# Patient Record
Sex: Male | Born: 1960 | Race: White | Hispanic: No | Marital: Married
Health system: Midwestern US, Academic
[De-identification: ages and names within clinical notes are randomized; demographics above are authoritative.]

## PROBLEM LIST (undated history)

## (undated) DIAGNOSIS — R911 Solitary pulmonary nodule: Secondary | ICD-10-CM

## (undated) DIAGNOSIS — F419 Anxiety disorder, unspecified: Secondary | ICD-10-CM

## (undated) DIAGNOSIS — I10 Essential (primary) hypertension: Secondary | ICD-10-CM

## (undated) DIAGNOSIS — K219 Gastro-esophageal reflux disease without esophagitis: Secondary | ICD-10-CM

## (undated) HISTORY — PX: TONSILLECTOMY: SUR1361

## (undated) HISTORY — DX: Solitary pulmonary nodule: R91.1

## (undated) HISTORY — DX: Anxiety disorder, unspecified: F41.9

## (undated) HISTORY — PX: FOOT SURGERY: SHX648

## (undated) HISTORY — PX: COLONOSCOPY: SHX174

---

## 2007-12-18 HISTORY — PX: HERNIA REPAIR: SHX51

## 2009-10-19 ENCOUNTER — Inpatient Hospital Stay: Attending: Gastroenterology

## 2009-10-19 NOTE — Unmapped (Signed)
[facility]     PATIENT NAME:   Phillip Collins, Phillip Collins                MR #:  [id number]   DATE OF BIRTH:  1961/02/24                        ACCOUNT #:  [id number]   PHYSICIAN:      Kathyrn Sheriff, M.D.              ROOM #:  SDS   SERVICE:        Gastroenterology                  NURSING UNIT:  MSDS   PRIMARY:        Eveline Keto, M.D.                FCSalena Saner   REFERRINGKathyrn Sheriff, M.D.              ADMIT DATE:  10/19/2009   DICTATED BY:    Kathyrn Sheriff, M.D.              PROCEDURE DATE:  10/19/2009                                                     DISCHARGE DATE:                                    ENDOSCOPY REPORT     *-*-*     PROCEDURE PERFORMED:     1.  Colonoscopy.     PREOPERATIVE DIAGNOSIS:     1. The patient was referred initially for a screening colonoscopy, however,     in the interim since that referral he has developed some mild left lower     quadrant pain.  He describes it in the left mid to left lower quadrant with     some radiation towards the left flank.  The pain is very intermittent and     does not seem to worsen with having bowel movements or straining.  It does     worsen a little bit when moving.  He did have bilateral inguinal hernia     repairs earlier this year.  On examination, there is no tenderness and     there is no evidence of recurrent herniation.     POSTOPERATIVE DIAGNOSIS:     1.  Normal colonoscopy to the terminal ileum.     ANESTHESIA:  Versed 5 mg, fentanyl 100 mcg.     DETAILS OF PROCEDURE:  After adequate sedation, rectal exam was normal.   Scope was inserted, advanced to the terminal ileum.  The scope was withdrawn   and mucosa was inspected.  The terminal ileum, cecum, ascending colon,   transverse colon, descending colon, sigmoid colon and rectum were normal.   Retroflexion view in the rectum was normal.  The scope was completely   withdrawn from the patient.  The patient tolerated the procedure well with no   immediate  complications.     RECOMMENDATIONS:  The source of the patient's abdominal pain is not clear   with this procedure.  Clearly  from a screening standpoint, he has no evidence   of polyps or cancer visualized today.  I would recommend a repeat routine   colonoscopy in 10 years.  To further evaluate his left-sided abdominal pain,   I'll go ahead and order a CAT scan of the abdomen with and without contrast.   I'll see him back in the office in follow-up.     Thank you for allowing [place] to see this patient.  If you have any questions,   please do not hesitate to contact the office.       *-*-*                                             _______________________________________   MC/sar                                 _____   D:  10/19/2009 11:13                   Kathyrn Sheriff, M.D.   T:  10/19/2009 23:10   Job #:  [id number]     c:   Eveline Keto, M.D.                                     ENDOSCOPY REPORT                                                                PAGE    1 of   1                                                                PAGE    1 of   1

## 2012-01-19 ENCOUNTER — Emergency Department (HOSPITAL_COMMUNITY)
Admission: EM | Admit: 2012-01-19 | Discharge: 2012-01-19 | Disposition: A | Discharge status: Home / Self Care | Payer: Private Health Insurance - Indemnity | Attending: Emergency Medicine | Admitting: Emergency Medicine

## 2012-01-19 ENCOUNTER — Other Ambulatory Visit: Payer: Self-pay

## 2012-01-19 ENCOUNTER — Emergency Department (HOSPITAL_COMMUNITY): Payer: Private Health Insurance - Indemnity

## 2012-01-19 ENCOUNTER — Encounter (HOSPITAL_COMMUNITY): Payer: Self-pay | Admitting: Emergency Medicine

## 2012-01-19 DIAGNOSIS — R059 Cough, unspecified: Secondary | ICD-10-CM | POA: Insufficient documentation

## 2012-01-19 DIAGNOSIS — R05 Cough: Secondary | ICD-10-CM | POA: Insufficient documentation

## 2012-01-19 DIAGNOSIS — Z79899 Other long term (current) drug therapy: Secondary | ICD-10-CM | POA: Insufficient documentation

## 2012-01-19 DIAGNOSIS — Z7982 Long term (current) use of aspirin: Secondary | ICD-10-CM | POA: Insufficient documentation

## 2012-01-19 DIAGNOSIS — R079 Chest pain, unspecified: Secondary | ICD-10-CM | POA: Insufficient documentation

## 2012-01-19 LAB — COMPREHENSIVE METABOLIC PANEL
AST: 18 U/L (ref 0–37)
Albumin: 4 g/dL (ref 3.5–5.2)
Alkaline Phosphatase: 63 U/L (ref 39–117)
BUN: 21 mg/dL (ref 6–23)
CO2: 23 mEq/L (ref 19–32)
Chloride: 107 mEq/L (ref 96–112)
GFR calc non Af Amer: 82 mL/min — ABNORMAL LOW (ref >90)
Potassium: 4.1 mEq/L (ref 3.5–5.1)
Total Bilirubin: 0.3 mg/dL (ref 0.3–1.2)

## 2012-01-19 LAB — DIFFERENTIAL
Basophils Absolute: 0 10*3/uL (ref 0.0–0.1)
Basophils Relative: 0 % (ref 0–1)
Lymphocytes Relative: 24 % (ref 12–46)
Monocytes Relative: 5 % (ref 3–12)
Neutro Abs: 4.9 10*3/uL (ref 1.7–7.7)
Neutrophils Relative %: 70 % (ref 43–77)

## 2012-01-19 LAB — CBC
Hemoglobin: 15 g/dL (ref 13.0–17.0)
MCHC: 34.5 g/dL (ref 30.0–36.0)
WBC: 7 10*3/uL (ref 4.0–10.5)

## 2012-01-19 MED ORDER — NAPROXEN 500 MG PO TABS: 500.0000 mg | ORAL_TABLET | Freq: Two times a day (BID) | ORAL | Status: AC

## 2012-01-19 NOTE — ED Notes (Signed)
Pt states he was seen at [facility] for the chest pain around three weeks ago.  MD there felt it may be strained muscles.  Advised pt to use heat pad.  Pt reports continuing chest pain.

## 2012-01-19 NOTE — Discharge Instructions (Signed)
Chest Pain (Nonspecific) It is often hard to give a specific diagnosis for the cause of chest pain. There is always a chance that your pain could be related to something serious, such as a heart attack or a blood clot in the lungs. You need to follow up with your caregiver for further evaluation. CAUSES   Heartburn.   Pneumonia or bronchitis.   Anxiety and stress.   Inflammation around your heart (pericarditis) or lung (pleuritis or pleurisy).   A blood clot in the lung.   A collapsed lung (pneumothorax). It can develop suddenly on its own (spontaneous pneumothorax) or from injury (trauma) to the chest.  The chest wall is composed of bones, muscles, and cartilage. Any of these can be the source of the pain.  The bones can be bruised by injury.   The muscles or cartilage can be strained by coughing or overwork.   The cartilage can be affected by inflammation and become sore (costochondritis).  DIAGNOSIS  Lab tests or other studies, such as X-rays, an EKG, stress testing, or cardiac imaging, may be needed to find the cause of your pain.  TREATMENT   Treatment depends on what may be causing your chest pain. Treatment may include:   Acid blockers for heartburn.   Anti-inflammatory medicine.   Pain medicine for inflammatory conditions.   Antibiotics if an infection is present.   You may be advised to change lifestyle habits. This includes stopping smoking and avoiding caffeine and chocolate.   You may be advised to keep your head raised (elevated) when sleeping. This reduces the chance of acid going backward from your stomach into your esophagus.   Most of the time, nonspecific chest pain will improve within 2 to 3 days with rest and mild pain medicine.  HOME CARE INSTRUCTIONS   If antibiotics were prescribed, take the full amount even if you start to feel better.   For the next few days, avoid physical activities that bring on chest pain. Continue physical activities as  directed.   Do not smoke cigarettes or drink alcohol until your symptoms are gone.   Only take over-the-counter or prescription medicine for pain, discomfort, or fever as directed by your caregiver.   Follow your caregiver's suggestions for further testing if your chest pain does not go away.   Keep any follow-up appointments you made. If you do not go to an appointment, you could develop lasting (chronic) problems with pain. If there is any problem keeping an appointment, you must call to reschedule.  SEEK MEDICAL CARE IF:   You think you are having problems from the medicine you are taking. Read your medicine instructions carefully.   Your chest pain does not go away, even after treatment.   You develop a rash with blisters on your chest.  SEEK IMMEDIATE MEDICAL CARE IF:   You have increased chest pain or pain that spreads to your arm, neck, jaw, back, or belly (abdomen).   You develop shortness of breath, an increasing cough, or you are coughing up blood.   You have severe back or abdominal pain, feel sick to your stomach (nauseous) or throw up (vomit).   You develop severe weakness, fainting, or chills.   You have an oral temperature above 102 F (38.9 C), not controlled by medicine.  THIS IS AN EMERGENCY. Do not wait to see if the pain will go away. Get medical help at once. Call your local emergency services (911 in [place] Do not drive yourself to   the hospital. MAKE SURE YOU:   Understand these instructions.   Will watch your condition.   Will get help right away if you are not doing well or get worse.  Document Released: 09/12/2005 Document Revised: 08/15/2011 Document Reviewed: 07/08/2008 Exit[employer] Patient Information 2012 [employer]. 

## 2012-01-19 NOTE — ED Notes (Signed)
Pt. Stated, I've been having chest pain since November.  Last night it seemed worse especially in the arm pit and the neck.

## 2012-01-19 NOTE — ED Provider Notes (Signed)
History     CSN: [id number]  Arrival date & time 01/19/12  4098   First MD Initiated Contact with Patient 01/19/12 1015      Chief Complaint  Patient presents with  . Chest Pain    (Consider location/radiation/quality/duration/timing/severity/associated sxs/prior treatment) HPI Patient presents to the emergency room with complaints of chest pain that is ongoing since November. Patient states he has been having frequent almost daily episodes of discomfort in his left chest that goes to the back. Patient states it's a vague discomfort associated with some sharp shooting pain. He was seen by his doctor 3 weeks ago and at that time he's been having some cough and upper respiratory symptoms. I felt that it may be related to chest wall discomfort. Patient states since that time it has not gotten any better and has persisted. Patient does note that he exercises daily and never has any discomfort with exercising. He denies any shortness of breath, nausea, vomiting or abdominal pain. He has not noticed any leg swelling.  Patient states he has had these episodes in the past when he was living in [place] and had been seen in the emergency department a few times for it. They were never able to determine the exact cause. He states he also had a echocardiogram a few years ago after one of these episodes and that was normal as well. There is no history of heart problems or lung problems of the family.  He does take Zoloft for anxiety History reviewed. No pertinent past medical history.  History reviewed. No pertinent past surgical history.  No family history on file.  History  Substance Use Topics  . Smoking status: Not on file  . Smokeless tobacco: Not on file  . Alcohol Use: No      Review of Systems  All other systems reviewed and are negative.    Allergies  Review of patient's allergies indicates no known allergies.  Home Medications   Current Outpatient Rx  Name Route Sig Dispense Refill   . ASPIRIN EC 81 MG PO TBEC Oral Take 81 mg by mouth daily.    Marland Kitchen VITAMIN D 1000 UNITS PO CAPS Oral Take 1,000 Units by mouth daily.    . OMEGA-3 FATTY ACIDS 1000 MG PO CAPS Oral Take 1 g by mouth daily.    Marland Kitchen GLUCOSAMINE-CHONDROITIN 500-400 MG PO TABS Oral Take 1 tablet by mouth daily.    . ADULT MULTIVITAMIN W/MINERALS CH Oral Take 1 tablet by mouth daily.    . SERTRALINE HCL 50 MG PO TABS Oral Take 50 mg by mouth daily.      BP 154/85  Pulse 66  Temp(Src) 97.6 F (36.4 C) (Oral)  Resp 20  SpO2 99%  Physical Exam  Nursing note and vitals reviewed. Constitutional: He appears well-developed and well-nourished. No distress.  HENT:  Head: Normocephalic and atraumatic.  Right Ear: External ear normal.  Left Ear: External ear normal.  Eyes: Conjunctivae are normal. Right eye exhibits no discharge. Left eye exhibits no discharge. No scleral icterus.  Neck: Neck supple. No tracheal deviation present.  Cardiovascular: Normal rate, regular rhythm and intact distal pulses.   Pulmonary/Chest: Effort normal and breath sounds normal. No stridor. No respiratory distress. He has no wheezes. He has no rales.  Abdominal: Soft. Bowel sounds are normal. He exhibits no distension. There is no tenderness. There is no rebound and no guarding.  Musculoskeletal: He exhibits no edema and no tenderness.  Neurological: He is alert. He has  normal strength. No sensory deficit. Cranial nerve deficit:  no gross defecits noted. He exhibits normal muscle tone. He displays no seizure activity. Coordination normal.  Skin: Skin is warm and dry. No rash noted.  Psychiatric: He has a normal mood and affect.    ED Course  Procedures (including critical care time)  Rate: 61  Rhythm: normal sinus rhythm  QRS Axis: normal  Intervals: normal  ST/T Wave abnormalities: normal  Conduction Disutrbances:none  Narrative Interpretation:   Old EKG Reviewed: none available  Labs Reviewed  COMPREHENSIVE METABOLIC PANEL -  Abnormal; Notable for the following:    GFR calc non Af Amer 82 (*)    All other components within normal limits  CBC  DIFFERENTIAL  POCT I-STAT TROPONIN I   Dg Chest 2 View  01/19/2012  *RADIOLOGY REPORT*  Clinical Data: Left-sided chest pain.  Cough.  CHEST - 2 VIEW  Comparison:  None.  Findings:  The heart size and mediastinal contours are within normal limits.  Both lungs are clear.  The visualized skeletal structures are unremarkable.  IMPRESSION: No active cardiopulmonary disease.  Original Report Authenticated By: Danae Orleans, M.D.       MDM  The patient has had months of chest pain. Symptoms have been somewhat atypical in nature. He has a normal EKG and normal laboratory testing. Patient previously has had these symptoms and has had a negative stress test per his history. At this time I have low suspicion for an acute cardiac etiology. I feel it is reasonable for him to follow up with his primary doctor as an outpatient. I will try a course of nonsteroidal anti-inflammatory medications to see if they help.       Celene Kras, MD 01/19/12 [phone]

## 2013-06-15 ENCOUNTER — Encounter (HOSPITAL_COMMUNITY): Payer: Self-pay | Admitting: Emergency Medicine

## 2013-06-15 ENCOUNTER — Emergency Department (HOSPITAL_COMMUNITY): Payer: Private Health Insurance - Indemnity

## 2013-06-15 ENCOUNTER — Emergency Department (HOSPITAL_COMMUNITY)
Admission: EM | Admit: 2013-06-15 | Discharge: 2013-06-15 | Disposition: A | Discharge status: Home / Self Care | Payer: Private Health Insurance - Indemnity | Attending: Emergency Medicine | Admitting: Emergency Medicine

## 2013-06-15 DIAGNOSIS — R0602 Shortness of breath: Secondary | ICD-10-CM | POA: Insufficient documentation

## 2013-06-15 DIAGNOSIS — Z7982 Long term (current) use of aspirin: Secondary | ICD-10-CM | POA: Insufficient documentation

## 2013-06-15 DIAGNOSIS — R079 Chest pain, unspecified: Secondary | ICD-10-CM

## 2013-06-15 DIAGNOSIS — Z79899 Other long term (current) drug therapy: Secondary | ICD-10-CM | POA: Insufficient documentation

## 2013-06-15 DIAGNOSIS — K219 Gastro-esophageal reflux disease without esophagitis: Secondary | ICD-10-CM | POA: Insufficient documentation

## 2013-06-15 DIAGNOSIS — R072 Precordial pain: Secondary | ICD-10-CM | POA: Insufficient documentation

## 2013-06-15 LAB — CBC
HCT: 43 % (ref 39.0–52.0)
Hemoglobin: 14.5 g/dL (ref 13.0–17.0)
MCH: 30.6 pg (ref 26.0–34.0)
MCHC: 33.7 g/dL (ref 30.0–36.0)

## 2013-06-15 LAB — BASIC METABOLIC PANEL
BUN: 14 mg/dL (ref 6–23)
Calcium: 8.8 mg/dL (ref 8.4–10.5)
Creatinine, Ser: 1.23 mg/dL (ref 0.50–1.35)
GFR calc non Af Amer: 65 mL/min — ABNORMAL LOW (ref >90)
Glucose, Bld: 90 mg/dL (ref 70–99)

## 2013-06-15 MED ORDER — PANTOPRAZOLE SODIUM 20 MG PO TBEC: 20.0000 mg | DELAYED_RELEASE_TABLET | Freq: Every day | ORAL | Status: DC

## 2013-06-15 MED ORDER — PANTOPRAZOLE SODIUM 20 MG PO TBEC
20.0000 mg | DELAYED_RELEASE_TABLET | Freq: Once | ORAL | Status: AC
  Administered 2013-06-15: 20 mg via ORAL
  Filled 2013-06-15: qty 1

## 2013-06-15 MED ORDER — GI COCKTAIL ~~LOC~~
30.0000 mL | Freq: Once | ORAL | Status: AC
  Administered 2013-06-15: 30 mL via ORAL
  Filled 2013-06-15: qty 30

## 2013-06-15 NOTE — ED Notes (Signed)
Pt c/o epigastric pain x several weeks worse over last 3 days; pt sts some SOB

## 2013-06-15 NOTE — ED Provider Notes (Signed)
History    CSN: [id number] Arrival date & time 06/15/13  1029  None    Chief Complaint  Patient presents with  . Chest Pain   (Consider location/radiation/quality/duration/timing/severity/associated sxs/prior Treatment) The history is provided by the patient.   Pt presents to the ED for chest pain.  States he has battled with it for several weeks but steadily increasing in severity over the past 3 days.  Describes the pain as a mid-sternal tightness and burning sensation. He states he feels like there is a "blockage" in his throat preventing him from breathing fully.  No difficulty or pain with swallowing.  Pain no associated with palpitations, dizziness, weakness, numbness, or paresthesias of extremities.  Has seen cardiology in the past with a negative echocardiogram in 2009 according to pt.  Negative personal or family hx of CAD or MI.  Patient does not that he travels a great deal for work, recent flight back from [place] last week.  Denies any lower extremity swelling or calf pain.  Pt is very active, daily runner.  No significant medial problems other than anxiety for which he takes zoloft.  Saw his PCP for similar sx recently and told it was GERD.  Started taking daily OTC medications without improvement.  History reviewed. No pertinent past medical history. History reviewed. No pertinent past surgical history. History reviewed. No pertinent family history. History  Substance Use Topics  . Smoking status: Not on file  . Smokeless tobacco: Not on file  . Alcohol Use: No    Review of Systems  Respiratory: Positive for shortness of breath.   Cardiovascular: Positive for chest pain.  All other systems reviewed and are negative.    Allergies  Review of patient's allergies indicates no known allergies.  Home Medications   Current Outpatient Rx  Name  Route  Sig  Dispense  Refill  . aspirin EC 81 MG tablet   Oral   Take 81 mg by mouth daily.         .  Cholecalciferol (VITAMIN D) 1000 UNITS capsule   Oral   Take 1,000 Units by mouth daily.         . fish oil-omega-3 fatty acids 1000 MG capsule   Oral   Take 1 g by mouth daily.         . Flaxseed, Linseed, (FLAX SEED OIL PO)   Oral   Take 1 capsule by mouth daily.         Marland Kitchen glucosamine-chondroitin 500-400 MG tablet   Oral   Take 1 tablet by mouth daily.         . Multiple Vitamin (MULITIVITAMIN WITH MINERALS) TABS   Oral   Take 1 tablet by mouth daily.         . psyllium (REGULOID) 0.52 G capsule   Oral   Take 0.52 g by mouth daily.         . sertraline (ZOLOFT) 50 MG tablet   Oral   Take 50 mg by mouth daily.          BP 125/81  Pulse 59  Temp(Src) 98.2 F (36.8 C) (Oral)  Resp 18  SpO2 100%  Physical Exam  Nursing note and vitals reviewed. Constitutional: He is oriented to person, place, and time. He appears well-developed and well-nourished.  HENT:  Head: Normocephalic and atraumatic.  Mouth/Throat: Uvula is midline, oropharynx is clear and moist and mucous membranes are normal. No oropharyngeal exudate, posterior oropharyngeal edema, posterior oropharyngeal erythema or  tonsillar abscesses.  Eyes: Conjunctivae and EOM are normal.  Neck: Normal range of motion. Neck supple.  Cardiovascular: Normal rate, regular rhythm and normal heart sounds.   Pulmonary/Chest: Effort normal and breath sounds normal. No respiratory distress. He has no wheezes.  Chest pain not reproducible with palpation to chest wall  Abdominal: There is no tenderness. There is no CVA tenderness, no tenderness at McBurney's point and negative Murphy's sign.  Musculoskeletal: Normal range of motion. He exhibits no edema.  Neurological: He is alert and oriented to person, place, and time. He has normal strength. No cranial nerve deficit or sensory deficit.  CN grossly intact, moves all extremities appropriately, no acute neuro deficits appreciated   Skin: Skin is warm and dry.   Psychiatric: He has a normal mood and affect.    ED Course  Procedures (including critical care time)   Date: 06/15/2013  Rate: 62  Rhythm: normal sinus rhythm  QRS Axis: normal  Intervals: normal  ST/T Wave abnormalities: normal  Conduction Disutrbances:none  Narrative Interpretation: NSR, no STEMI  Old EKG Reviewed: unchanged   Labs Reviewed  BASIC METABOLIC PANEL - Abnormal; Notable for the following:    GFR calc non Af Amer 65 (*)    GFR calc Af Amer 75 (*)    All other components within normal limits  CBC  D-DIMER, QUANTITATIVE  POCT I-STAT TROPONIN I   Dg Chest 2 View  06/15/2013   *RADIOLOGY REPORT*  Clinical Data: 52 year old male with centralized chest pain.  CHEST - 2 VIEW  Comparison: 01/19/2012.  Findings: Stable and normal lung volumes. Normal cardiac size and mediastinal contours.  Visualized tracheal air column is within normal limits.  The lungs are clear.  No pneumothorax or effusion. No acute osseous abnormality identified.  IMPRESSION: Negative, no acute cardiopulmonary abnormality.   Original Report Authenticated By: Erskine Speed, M.D.   1. Chest pain   2. Acid reflux     MDM   EKG NSR, no acute ischemic changes.  Trop and d-dimer negative.  CXR clear.   Other labs largely WNL.  Sx improved with GI cocktail and protonix- suspicion that chest pain is GERD related.   Doubt ACS, PE, dissection, or other vascular compromise.  Rx protonix.  FU with [facility].  Discussed plan with pt, he agreed.  Return precautions advised.  Discussed pt with Dr. Bernette Mayers who agrees with plan.        Garlon Hatchet, PA-C 06/15/13 1922  Garlon Hatchet, PA-C 06/15/13 [phone]

## 2013-06-15 NOTE — ED Notes (Signed)
Pt discharged.Vital signs stable and GCS 15 

## 2013-06-16 NOTE — ED Provider Notes (Signed)
Medical screening examination/treatment/procedure(s) were performed by non-physician practitioner and as supervising physician I was immediately available for consultation/collaboration.   Charles B. Sheldon, MD 06/16/13 2109 

## 2014-03-04 ENCOUNTER — Ambulatory Visit (INDEPENDENT_AMBULATORY_CARE_PROVIDER_SITE_OTHER): Payer: BC Managed Care – PPO | Admitting: Internal Medicine

## 2014-03-04 ENCOUNTER — Encounter: Payer: Self-pay | Admitting: Internal Medicine

## 2014-03-04 VITALS — BP 124/84 | HR 50 | Temp 98.2°F | Ht 71.0 in | Wt 191.6 lb

## 2014-03-04 DIAGNOSIS — R079 Chest pain, unspecified: Secondary | ICD-10-CM

## 2014-03-04 NOTE — Progress Notes (Signed)
Subjective:    Patient ID: Jonathan Bass, male    DOB: [birth year]  MRN: [id number]  HPI  [id number] yowm never smoked with no previous h/o childhood illnesses and full aerobic capacity in HS and continued to stay athletically active as adult but around 2009 onset of chest comfort > card/pulm eval in [place] resolved completely on zoloft meds but recurred in 2013 and Jun 2014 and referred 03/04/2014 by Jonathan Bass for pulmonary eval p neg stress by Jonathan Bass and neg egd by Jonathan Bass   03/04/2014 1st [facility] office visit/ Jonathan Bass maintained on 100 mg per day zoloft Chief Complaint  Patient presents with  . Pulmonary Consult    Self referral- pt c/o discomfort in chest since June 2014.   no change with librax/protonix  Pattern is daily  Since onset June 2014 / diffuse  Bilateral Anterior upper chest and neck burning/ tingling constant x 6 m but less noticeable when at beach and better p exertion and not present with sleeping   No obvious other patterns in day to day or daytime variabilty or assoc chronic cough  Or sob or pleuritic cp or chest tightness, subjective wheeze overt sinus or hb symptoms. No unusual exp hx or h/o childhood pna/ asthma or knowledge of premature birth.  Sleeping ok without nocturnal  or early am exacerbation  of respiratory  c/o's or need for noct saba. Also denies any obvious fluctuation of symptoms with weather or environmental changes or other aggravating or alleviating factors except as outlined above   Current Medications, Allergies, Complete Past Medical History, Past Surgical History, Family History, and Social History were reviewed in [facility] electronic medical record.           Review of Systems  Constitutional: Negative for fever, chills, activity change, appetite change and unexpected weight change.  HENT: Negative for congestion, dental problem, postnasal drip, rhinorrhea, sneezing, sore throat, trouble swallowing and voice change.    Eyes: Negative for visual disturbance.  Respiratory: Negative for cough, choking and shortness of breath.        Chest discomfort   Cardiovascular: Negative for chest pain and leg swelling.  Gastrointestinal: Negative for nausea, vomiting and abdominal pain.  Genitourinary: Negative for difficulty urinating.  Musculoskeletal: Negative for arthralgias.  Skin: Negative for rash.  Psychiatric/Behavioral: Negative for behavioral problems and confusion.       Objective:   Physical Exam  Tense wm nad  Wt Readings from Last 3 Encounters:  03/04/14 191 lb 9.6 oz (86.909 kg)     HEENT: nl dentition, turbinates, and orophanx. Nl external ear canals without cough reflex   NECK :  without JVD/Nodes/TM/ nl carotid upstrokes bilaterally   LUNGS: no acc muscle use, clear to A and P bilaterally without cough on insp or exp maneuvers   CV:  RRR  no s3 or murmur or increase in P2, no edema   ABD:  soft and nontender with nl excursion in the supine position. No bruits or organomegaly, bowel sounds nl  MS:  warm without deformities, calf tenderness, cyanosis or clubbing  SKIN: warm and dry without lesions    NEURO:  alert, approp, no deficits    cxr 06/15/13 Findings: Stable and normal lung volumes. Normal cardiac size and  mediastinal contours. Visualized tracheal air column is within  normal limits. The lungs are clear. No pneumothorax or effusion.  No acute osseous abnormality identified.        Assessment & Plan:

## 2014-03-04 NOTE — Patient Instructions (Addendum)
GERD (REFLUX)  is an extremely common cause of respiratory symptoms, many times with no significant heartburn at all.    It can be treated with medication, but also with lifestyle changes including avoidance of late meals, excessive alcohol, smoking cessation, and avoid fatty foods, chocolate, peppermint, colas, red wine, and acidic juices such as orange juice.  NO MINT OR MENTHOL PRODUCTS SO NO COUGH DROPS  USE SUGARLESS CANDY INSTEAD (jolley ranchers or Stover's)  NO OIL BASED VITAMINS - use powdered substitutes.  Avoid  foods that cause gas (especially beans and raw vegetables like spinach and salads and boiled eggs)  and citrucel 1 heaping tsp twice daily with a large glass of water.  Pain should improve w/in 2 weeks and if not then consider further GI work up.      Pulmonary follow up is as needed

## 2014-03-05 DIAGNOSIS — R072 Precordial pain: Secondary | ICD-10-CM | POA: Insufficient documentation

## 2014-03-05 NOTE — Assessment & Plan Note (Signed)
Classic   pain pattern suggests ibs:  Stereotypical,  very limited distribution of pain locations, daytime, not exacerbated by ex or coughing, worse in sitting position, , not as noticeable present supine due to the dome effect of the diaphragm is  canceled in that position. Frequently these patients have had multiple negative GI workups and CT scans as is the case here   Treatment consists of avoiding foods that cause gas (especially beans and raw vegetables like spinach and salads)  and citrucel 1 heaping tsp twice daily with a large glass of water.  Pain should improve w/in 2 weeks and if not then consider further GI work up for non - acid GERD with esophageal spasm? which is still also in the ddx? (defer this issue to Dr Madilyn FiremanHayes  If not better then reasonable to repeat a CTa of chest but informed pt this would be very very low yield, high cost and RT

## 2014-10-17 DIAGNOSIS — R911 Solitary pulmonary nodule: Secondary | ICD-10-CM

## 2014-10-17 HISTORY — DX: Solitary pulmonary nodule: R91.1

## 2014-10-21 ENCOUNTER — Other Ambulatory Visit: Payer: Self-pay | Admitting: Orthopedic Surgery

## 2014-10-21 DIAGNOSIS — R079 Chest pain, unspecified: Secondary | ICD-10-CM

## 2014-10-21 DIAGNOSIS — M546 Pain in thoracic spine: Secondary | ICD-10-CM

## 2014-10-26 ENCOUNTER — Ambulatory Visit
Admission: RE | Admit: 2014-10-26 | Discharge: 2014-10-26 | Disposition: A | Discharge status: Home / Self Care | Payer: BC Managed Care – PPO | Source: Ambulatory Visit | Attending: Orthopedic Surgery | Admitting: Orthopedic Surgery

## 2014-10-26 DIAGNOSIS — M546 Pain in thoracic spine: Secondary | ICD-10-CM

## 2014-10-26 DIAGNOSIS — R079 Chest pain, unspecified: Secondary | ICD-10-CM

## 2015-04-11 ENCOUNTER — Telehealth: Payer: Self-pay | Admitting: Vascular Surgery

## 2015-04-11 NOTE — Telephone Encounter (Signed)
Per Marisue IvanLiz:   "needs a right leg reflux study and any md new vv after"  04/11/15: lm for pt re appt on 05/18- asked that he cb to schedule, dpm

## 2015-04-26 ENCOUNTER — Other Ambulatory Visit: Payer: Self-pay | Admitting: *Deleted

## 2015-04-26 DIAGNOSIS — I83891 Varicose veins of right lower extremities with other complications: Secondary | ICD-10-CM

## 2015-05-03 ENCOUNTER — Other Ambulatory Visit: Payer: Self-pay | Admitting: Family Medicine

## 2015-05-03 ENCOUNTER — Encounter: Payer: Self-pay | Admitting: Vascular Surgery

## 2015-05-03 DIAGNOSIS — R911 Solitary pulmonary nodule: Secondary | ICD-10-CM

## 2015-05-05 ENCOUNTER — Encounter: Payer: Self-pay | Admitting: Vascular Surgery

## 2015-05-05 ENCOUNTER — Encounter (HOSPITAL_COMMUNITY): Payer: Self-pay

## 2015-05-06 ENCOUNTER — Ambulatory Visit
Admission: RE | Admit: 2015-05-06 | Discharge: 2015-05-06 | Disposition: A | Discharge status: Home / Self Care | Payer: BLUE CROSS/BLUE SHIELD | Source: Ambulatory Visit | Attending: Family Medicine | Admitting: Family Medicine

## 2015-05-06 DIAGNOSIS — R911 Solitary pulmonary nodule: Secondary | ICD-10-CM

## 2015-07-04 ENCOUNTER — Other Ambulatory Visit (HOSPITAL_COMMUNITY): Payer: Self-pay | Admitting: Otolaryngology

## 2015-07-04 DIAGNOSIS — J320 Chronic maxillary sinusitis: Secondary | ICD-10-CM

## 2015-07-13 ENCOUNTER — Ambulatory Visit (HOSPITAL_COMMUNITY)
Admission: RE | Admit: 2015-07-13 | Discharge: 2015-07-13 | Disposition: A | Discharge status: Home / Self Care | Payer: BLUE CROSS/BLUE SHIELD | Source: Ambulatory Visit | Attending: Otolaryngology | Admitting: Otolaryngology

## 2015-07-13 DIAGNOSIS — J320 Chronic maxillary sinusitis: Secondary | ICD-10-CM

## 2015-07-13 DIAGNOSIS — R51 Headache: Secondary | ICD-10-CM | POA: Diagnosis not present

## 2015-07-13 MED ORDER — GADOBENATE DIMEGLUMINE 529 MG/ML IV SOLN
20.0000 mL | Freq: Once | INTRAVENOUS | Status: AC | PRN
  Administered 2015-07-13: 17 mL via INTRAVENOUS

## 2016-03-26 ENCOUNTER — Other Ambulatory Visit: Payer: Self-pay | Admitting: Family Medicine

## 2016-03-26 DIAGNOSIS — R911 Solitary pulmonary nodule: Secondary | ICD-10-CM

## 2016-04-20 ENCOUNTER — Ambulatory Visit
Admission: RE | Admit: 2016-04-20 | Discharge: 2016-04-20 | Disposition: A | Discharge status: Home / Self Care | Payer: Managed Care, Other (non HMO) | Source: Ambulatory Visit | Attending: Family Medicine | Admitting: Family Medicine

## 2016-04-20 DIAGNOSIS — R911 Solitary pulmonary nodule: Secondary | ICD-10-CM

## 2018-04-17 ENCOUNTER — Other Ambulatory Visit: Payer: Self-pay | Admitting: Family Medicine

## 2018-04-17 DIAGNOSIS — R911 Solitary pulmonary nodule: Secondary | ICD-10-CM

## 2018-04-21 ENCOUNTER — Ambulatory Visit
Admission: RE | Admit: 2018-04-21 | Discharge: 2018-04-21 | Disposition: A | Discharge status: Home / Self Care | Payer: Managed Care, Other (non HMO) | Source: Ambulatory Visit | Attending: Family Medicine | Admitting: Family Medicine

## 2018-04-21 DIAGNOSIS — R911 Solitary pulmonary nodule: Secondary | ICD-10-CM

## 2018-10-24 ENCOUNTER — Emergency Department: Admit: 2018-10-25 | Payer: PRIVATE HEALTH INSURANCE

## 2018-10-24 DIAGNOSIS — R079 Chest pain, unspecified: Secondary | ICD-10-CM

## 2018-10-24 NOTE — Unmapped (Signed)
Patient to ED with c/o Chest pain.  Patient reports pain started 30 minutes ago.  At that time, he was SOB and diaphoretic.

## 2018-10-24 NOTE — Unmapped (Signed)
[place] ED Note    Date of Service: 10/24/2018    Reason for Visit: No chief complaint on file.      Patient History     HPI:  This is a 57 y.o. male with history as documented below presenting with chest pain.    Pain started shortly before arrival, approximately 30-40 minutes ago.  He says he was sitting on his couch, when he stood up he had sudden onset of diffuse anterior chest pain that he described like a throbbing and squeezing/tight sensation.  It did not radiate.  It was not exertional.  He had associated shortness of breath and diaphoresis.  States he has never felt anything like this before.  He says it is not completely gone but is much better than it was when it started.  No longer having any shortness of breath or diaphoresis.  He denies any previous cardiac problems.  No family history of CAD.  He does not smoke.  He denies any history of hypertension, hyperlipidemia, or diabetes.  No history of deep vein thrombosis or PE.  No prior cancer.  Takes no hormones.  However patient does report that he drove home from [place] today.  Has not noted any pain, swelling, or asymmetry in his legs.  Denies any other recent illness.      History reviewed. No pertinent past medical history.    Past Surgical History:   Procedure Laterality Date   ??? HERNIA REPAIR     ??? TONSILLECTOMY         KAVONTE BEARSE  reports that he has never smoked. He has never used smokeless tobacco. He reports previous alcohol use. He reports that he does not use drugs.    Patient's Medications    No medications on file       Allergies:   Allergies as of 10/24/2018   ??? (No Known Allergies)       PMH: Nursing notes reviewed   PSH: Nursing notes reviewed   FH: Nursing notes reviewed   MEDS: Nursing notes and chart reviewed     Review of Systems     ROS:    CONST - no fevers, no chills   HEAD - no headache   EYES/EARS - no vision changes  NOSE/THROAT - no rhinorrhea, no sore throat    CV - no palpitations   PULM - no cough, no sputum production; no hemoptysis   GI - no abd pain, no n/v/d   GU - no dysuria, no hematuria  MS - no swelling  NEURO - no paresis, no numbness  INTEGUMENT - no lesions, no rashes     Physical Exam     Vitals:    10/24/18 2201 10/25/18 0107   BP: (!) 157/94 112/70   BP Location: Left arm Right arm   Patient Position: Sitting Lying   Pulse: 62 56   Resp: 16 16   Temp: 97.5 ??F (36.4 ??C)    TempSrc: Oral    SpO2: 99% 98%   Weight: 190 lb (86.2 kg)    Height: 5' 11 (1.803 m)        General:  well developed; well appearing male in no apparent distress   HEENT:  normocephalic, atraumatic; pupils equal, round and reactive; sclera anicteric; conjunctiva pink; moist mucous membranes; no oropharyngeal erythema or mucosal lesions   Neck:  Neck moves easily; range of motion full.  Pulmonary:  no respiratory distress; lung sounds clear to auscultation bilaterally  with good air entry;  no wheezes or rales   Cardiac:  regular rate and regular rhythm with no murmurs, rubs, or gallops   Abdomen:  soft; non-distended; non-tender, no rebound or guarding  Musculoskeletal:  no focal swelling or tenderness. No peripheral edema   Vascular:  2+ peripheral pulses   Skin:  warm and well perfused without rashes or lesions   Neuro:  Awake and alert.  Moves all extremities spontaneously.   Psych:  appropriate mood and affect     Diagnostic Studies     Labs:  Relevant labs reviewed.  Please see medical record for test performed in the ED.    Radiology:  X-ray Chest PA and Lateral   Final Result   IMPRESSION:    No acute cardiopulmonary abnormality.      Approved by Beola Cord, MD on 10/24/2018 10:44 PM EST      I have personally reviewed the images and I agree with this report.      Report Verified by: Lana Fish, MD at 10/24/2018 10:45 PM EST          EKG:  Normal sinus rhythm. Rate 62 bpm. Normal intervals. Normal axis.  Isolated TWI of lead 3.  No ST changes concerning for ischemia. No prior  available.    Emergency Department Procedures     Procedures    ED Course and MDM     VARDAAN DEPASCALE is a 57 y.o. male with a history and presentation as described above in HPI.  The patient was evaluated by myself.    Upon presentation, the patient was well-appearing, afebrile and hemodynamically stable.  Blood pressure is mildly elevated here although he notes no prior history of hypertension.    I do not feel that the patient???s chest pain is related to acute coronary syndrome given the lack of risk factors and based on the description of symptoms. Troponin was negative on arrival and 90 minutes.  There was a normal EKG and chest X-ray.  HEART score of 2 (possibly 3 if actually has undiagnosed HTN), making him low risk for MACE.  BNP is normal and he has no signs of volume overload and I'm not concerned for HF.  There are no signs of pneumonia based on exam or history and no signs of a pneumothorax or pleural effusion.  There were also no signs of pericarditis based on history or EKG.  No clinical concern for aortic dissection.  Given his prolonged travel time driving from out of state today did have concern for possibility of PE although he otherwise has a lack of risk factors and clinical presentation is not entirely consistent with this.  The patient is not tachycardic, hypoxic or tachypneic and his pain is not pleuritic in nature.  D-dimer is negative making it exceedingly unlikely in combination with overall low clinical suspicion that he has PE.    The patient overall appears well, is breathing comfortably, and is stable for discharge home with followup.      Summary of Treatment in ED:    Medications - No data to display    Impression     1. Chest pain, unspecified type         Plan   At this time the patient has been deemed safe for discharge. The patient was advised to follow up with his PCP.  Risks, benefits, and alternatives were discussed.  Discharge instructions including strict return precautions  for worsening or new symptoms have been communicated.  The patient verbalized understanding of the instructions and agreement with the plan.         Antonietta Jewel, MD   [facility]               Areatha Keas, MD  10/25/18 [phone]

## 2018-10-25 ENCOUNTER — Inpatient Hospital Stay
Admit: 2018-10-25 | Discharge: 2018-10-25 | Disposition: A | Discharge status: Home / Self Care | Payer: PRIVATE HEALTH INSURANCE

## 2018-10-25 LAB — BASIC METABOLIC PANEL
Anion Gap: 10 mmol/L (ref 3–16)
BUN: 20 mg/dL (ref 7–25)
CO2: 22 mmol/L (ref 21–33)
Calcium: 9.1 mg/dL (ref 8.6–10.3)
Chloride: 105 mmol/L (ref 98–110)
Creatinine: 1.18 mg/dL (ref 0.60–1.30)
Glucose: 103 mg/dL — ABNORMAL HIGH (ref 70–100)
Osmolality, Calculated: 287 mosm/kg (ref 278–305)
Potassium: 3.8 mmol/L (ref 3.5–5.3)
Sodium: 137 mmol/L (ref 133–146)
eGFR AA CKD-EPI: 78 See note.
eGFR NONAA CKD-EPI: 67 See note.

## 2018-10-25 LAB — DIFFERENTIAL
Basophils Absolute: 47 /uL (ref 0–200)
Basophils Relative: 0.7 % (ref 0.0–1.0)
Eosinophils Absolute: 281 /uL (ref 15–500)
Eosinophils Relative: 4.2 % (ref 0.0–8.0)
Lymphocytes Absolute: 2626 /uL (ref 850–3900)
Lymphocytes Relative: 39.2 % (ref 15.0–45.0)
Monocytes Absolute: 549 /uL (ref 200–950)
Monocytes Relative: 8.2 % (ref 0.0–12.0)
Neutrophils Absolute: 3196 /uL (ref 1500–7800)
Neutrophils Relative: 47.7 % (ref 40.0–80.0)

## 2018-10-25 LAB — PROTIME-INR
INR: 0.8 — ABNORMAL LOW (ref 0.9–1.1)
Protime: 11.7 s — ABNORMAL LOW (ref 12.1–15.1)

## 2018-10-25 LAB — CBC
Hematocrit: 45 % (ref 38.5–50.0)
Hemoglobin: 15.1 g/dL (ref 13.2–17.1)
MCH: 30.8 pg (ref 27.0–33.0)
MCHC: 33.5 g/dL (ref 32.0–36.0)
MCV: 91.9 fL (ref 80.0–100.0)
MPV: 8.9 fL (ref 7.5–11.5)
Platelets: 197 10*3/uL (ref 140–400)
RBC: 4.9 10*6/uL (ref 4.20–5.80)
RDW: 13.5 % (ref 11.0–15.0)
WBC: 6.7 10*3/uL (ref 3.8–10.8)

## 2018-10-25 LAB — TROPONIN I 0 HOUR: Troponin I: <0.04 ng/mL (ref 0.00–0.03)

## 2018-10-25 LAB — TROPONIN I 90 MIN: Troponin I: <0.04 ng/mL (ref 0.00–0.03)

## 2018-10-25 LAB — B NATRIURETIC PEPTIDE: BNP: 15 pg/mL (ref 0–100)

## 2018-10-25 LAB — APTT: aPTT: 31.3 seconds (ref 25.5–35.0)

## 2018-10-25 LAB — DDIMER: D-Dimer: <0.27 ug{FEU}/mL (ref 0.00–0.50)

## 2018-10-25 NOTE — Unmapped (Addendum)
Follow up with your physician or clinic within the next week. Return to the ED immediately if you have worse or continued chest pain, shortness of breath, nausea, sweating during chest pain, trouble breathing, chest pain with exertion (climbing stairs or walking for example), or any other concerns.

## 2018-10-25 NOTE — ED Notes (Signed)
Patient ready for discharge. Discharge instructions reviewed with patient. Patient alert/oriented X4. VSS.

## 2018-12-17 DIAGNOSIS — Z89511 Acquired absence of right leg below knee: Secondary | ICD-10-CM

## 2018-12-17 HISTORY — DX: Acquired absence of right leg below knee: Z89.511

## 2018-12-25 ENCOUNTER — Other Ambulatory Visit: Payer: Self-pay

## 2018-12-25 ENCOUNTER — Emergency Department (HOSPITAL_BASED_OUTPATIENT_CLINIC_OR_DEPARTMENT_OTHER)
Admission: EM | Admit: 2018-12-25 | Discharge: 2018-12-25 | Disposition: A | Discharge status: Home / Self Care | Payer: 59 | Attending: Emergency Medicine | Admitting: Emergency Medicine

## 2018-12-25 ENCOUNTER — Emergency Department (HOSPITAL_BASED_OUTPATIENT_CLINIC_OR_DEPARTMENT_OTHER): Payer: 59

## 2018-12-25 ENCOUNTER — Encounter (HOSPITAL_BASED_OUTPATIENT_CLINIC_OR_DEPARTMENT_OTHER): Payer: Self-pay | Admitting: *Deleted

## 2018-12-25 DIAGNOSIS — Z79899 Other long term (current) drug therapy: Secondary | ICD-10-CM | POA: Insufficient documentation

## 2018-12-25 DIAGNOSIS — Z7982 Long term (current) use of aspirin: Secondary | ICD-10-CM | POA: Diagnosis not present

## 2018-12-25 DIAGNOSIS — R079 Chest pain, unspecified: Secondary | ICD-10-CM | POA: Insufficient documentation

## 2018-12-25 LAB — TROPONIN I: Troponin I: <0.03 ng/mL (ref <0.03)

## 2018-12-25 MED ORDER — OMEPRAZOLE 20 MG PO CPDR: 20.0000 mg | DELAYED_RELEASE_CAPSULE | Freq: Every day | ORAL | 0 refills | Status: DC

## 2018-12-25 MED ORDER — IOPAMIDOL (ISOVUE-370) INJECTION 76%
100.0000 mL | Freq: Once | INTRAVENOUS | Status: AC | PRN
  Administered 2018-12-25: 100 mL via INTRAVENOUS

## 2018-12-25 NOTE — ED Triage Notes (Signed)
He had a work up today at his MD's office and he had an elevated DDimer. His MD advised him to come here for further testing and treatment.

## 2018-12-25 NOTE — ED Notes (Signed)
Pt reports on 10/25/18 he had pounding/throbbing chest pain with pressure after a drive to [place] that sent him to the ED up there. Pt reports everything checked out fine during that episode. Pt reports this has occurred 6 different times since then. Pt reports these episodes happen when he is laying down for the night. Pt reports he exercises every day with no problems. Pt reports last occurrence happened Tuesday night and he went to his PCP. Pt reports his PCP did a D-Dimer and he received a call that said it was elevated. Pt was told to come into the ED for further evaluation.

## 2018-12-25 NOTE — ED Notes (Signed)
Patient verbalizes understanding of discharge instructions. Opportunity for questioning and answers were provided. Armband removed by staff, pt discharged from ED home via POV.  

## 2018-12-25 NOTE — ED Provider Notes (Signed)
[facility] EMERGENCY DEPARTMENT Provider Note   CSN: [id number] Arrival date & time: 12/25/18  2129     History   Chief Complaint Chief Complaint  Patient presents with  . Chest Pain    HPI Jonathan Bass is a 58 y.o. male.  HPI Patient is sent in after outpatient positive d-dimer.  Over the last 2 months been having episodes of chest pain.  He states will come about every 10 days.  States it tends to be in the morning when he wakes up.  Had had a previous episode in [place] when he had been up there in November.  Had had a work-up in [place] it was negative including a d-dimer.  However at [facility] today reportedly had another d-dimer that was positive.  Discussed with Dr. Modesto Charon over the phone who states that d-dimer was mildly elevated.  Troponin not returned yet.  Creatinine of 1.1.  Patient gets pain in his mid chest goes to the back and will seem to change with his heartbeat.  No swelling in his legs.  Last episode was Tuesday and today is Thursday and still has some dull pain. Past Medical History:  Diagnosis Date  . Anxiety     Patient Active Problem List   Diagnosis Date Noted  . Chest pain, unspecified 03/05/2014    Past Surgical History:  Procedure Laterality Date  . FOOT SURGERY    . HERNIA REPAIR  2009        Home Medications    Prior to Admission medications   Medication Sig Start Date End Date Taking? Authorizing Provider  aspirin EC 81 MG tablet Take 81 mg by mouth daily.   Yes [provider]  propranolol (INDERAL) 10 MG tablet Take 10 mg by mouth 2 (two) times daily as needed.   Yes [provider]  sertraline (ZOLOFT) 100 MG tablet Take 100 mg by mouth daily.   Yes [provider]  Ergocalciferol (VITAMIN D2) 400 UNITS TABS Take 1 tablet by mouth daily.    [provider]  glucosamine-chondroitin 500-400 MG tablet Take 1 tablet by mouth 3 (three) times daily.     [provider]    Multiple Vitamin (MULITIVITAMIN WITH MINERALS) TABS Take 1 tablet by mouth daily.    [provider]  omeprazole (PRILOSEC) 20 MG capsule Take 1 capsule (20 mg total) by mouth daily. 12/25/18   Benjiman Core, MD    Family History Family History  Problem Relation Age of Onset  . Prostate cancer Father   . Breast cancer Mother     Social History Social History   Tobacco Use  . Smoking status: Never Smoker  . Smokeless tobacco: Never Used  Substance Use Topics  . Alcohol use: No  . Drug use: No     Allergies   Patient has no known allergies.   Review of Systems Review of Systems  Constitutional: Negative for appetite change.  Respiratory: Negative for shortness of breath.   Cardiovascular: Positive for chest pain.  Gastrointestinal: Negative for abdominal pain.  Genitourinary: Negative for flank pain.  Musculoskeletal: Positive for back pain.  Skin: Negative for pallor.  Neurological: Negative for weakness.  Psychiatric/Behavioral: Negative for confusion.     Physical Exam Updated Vital Signs BP (!) 157/92   Pulse 62   Temp 98 F (36.7 C) (Oral)   Resp 16   Ht 5\' 11"  (1.803 m)   Wt 90.7 kg   SpO2 100%   BMI 27.89  kg/m   Physical Exam HENT:     Head: Normocephalic.  Neck:     Musculoskeletal: Neck supple.  Cardiovascular:     Heart sounds: No murmur.  Pulmonary:     Effort: No tachypnea.     Breath sounds: No decreased breath sounds or wheezing.  Abdominal:     Tenderness: There is no abdominal tenderness.  Musculoskeletal:     Right lower leg: No edema.     Left lower leg: No edema.  Skin:    General: Skin is warm.     Capillary Refill: Capillary refill takes less than 2 seconds.  Neurological:     Mental Status: He is alert.      ED Treatments / Results  Labs (all labs ordered are listed, but only abnormal results are displayed) Labs Reviewed  TROPONIN I    EKG EKG Interpretation  Date/Time:  Thursday December 25 2018  21:36:24 EST Ventricular Rate:  62 PR Interval:  186 QRS Duration: 98 QT Interval:  388 QTC Calculation: 393 R Axis:   67 Text Interpretation:  Normal sinus rhythm Normal ECG Confirmed by Benjiman Core [phone]) on 12/25/2018 11:39:17 PM   Radiology Ct Angio Chest Pe W And/or Wo Contrast  Result Date: 12/25/2018 CLINICAL DATA:  58 y/o M; throbbing mid chest pain. Elevated D-dimer. EXAM: CT ANGIOGRAPHY CHEST WITH CONTRAST TECHNIQUE: Multidetector CT imaging of the chest was performed using the standard protocol during bolus administration of intravenous contrast. Multiplanar CT image reconstructions and MIPs were obtained to evaluate the vascular anatomy. CONTRAST:  ISOVUE-370 IOPAMIDOL (ISOVUE-370) INJECTION 76% COMPARISON:  04/21/2018 CT chest. FINDINGS: Cardiovascular: Satisfactory opacification of the pulmonary arteries to the segmental level. No evidence of pulmonary embolism. Normal heart size. No pericardial effusion. Mediastinum/Nodes: No enlarged mediastinal, hilar, or axillary lymph nodes. Thyroid gland, trachea, and esophagus demonstrate no significant findings. Lungs/Pleura: Stable 7 mm subpleural nodule in right lung base (series 3, image 73) with benign etiology. Multiple small scattered calcified granuloma. No consolidation, effusion, or pneumothorax. Upper Abdomen: No acute abnormality. Stable cysts at dome of liver. Musculoskeletal: No chest wall abnormality. No acute or significant osseous findings. Review of the MIP images confirms the above findings. IMPRESSION: No pulmonary embolus or acute pulmonary process identified. Stable CT of the chest. Electronically Signed   By: Mitzi Hansen M.D.   On: 12/25/2018 22:54    Procedures Procedures (including critical care time)  Medications Ordered in ED Medications  iopamidol (ISOVUE-370) 76 % injection 100 mL (100 mLs Intravenous Contrast Given 12/25/18 2216)     Initial Impression / Assessment and Plan / ED Course    I have reviewed the triage vital signs and the nursing notes.  Pertinent labs & imaging results that were available during my care of the patient were reviewed by me and considered in my medical decision making (see chart for details).     Patient sent in for CTA after positive d-dimer.  EKG reassuring.  INR reassuring.  CT scan shows no PE.  Since the pain is worse with laying down and in the morning will treat with Prilosec.  Follow-up as an outpatient.  Final Clinical Impressions(s) / ED Diagnoses   Final diagnoses:  Nonspecific chest pain    ED Discharge Orders         Ordered    omeprazole (PRILOSEC) 20 MG capsule  Daily     12/25/18 2303           Benjiman Core, MD 12/25/18 2340

## 2018-12-25 NOTE — ED Triage Notes (Signed)
Pt states was contacted by his doctor and advised to come to the ED for evaluation of elevated d-dimer. Denies chest  Pain at present. O2 sats 100% on RA, HR 65 by nursefirst

## 2019-01-17 HISTORY — PX: OTHER SURGICAL HISTORY: SHX169

## 2019-01-17 HISTORY — PX: TRANSTHORACIC ECHOCARDIOGRAM: SHX275

## 2019-01-21 ENCOUNTER — Ambulatory Visit: Payer: 59 | Admitting: Cardiology

## 2019-01-21 ENCOUNTER — Encounter: Payer: Self-pay | Admitting: Cardiology

## 2019-01-21 VITALS — BP 128/80 | HR 80 | Ht 71.0 in | Wt 204.2 lb

## 2019-01-21 DIAGNOSIS — R072 Precordial pain: Secondary | ICD-10-CM | POA: Diagnosis not present

## 2019-01-21 DIAGNOSIS — R002 Palpitations: Secondary | ICD-10-CM | POA: Diagnosis not present

## 2019-01-21 NOTE — Patient Instructions (Signed)
.Medication Instructions:  NOT NEEDED If you need a refill on your cardiac medications before your next appointment, please call your pharmacy.   Lab work: NOT NEEDED If you have labs (blood work) drawn today and your tests are completely normal, you will receive your results only by: Marland Kitchen. MyChart Message (if you have MyChart) OR . A paper copy in the mail If you have any lab test that is abnormal or we need to change your treatment, we will call you to review the results.  Testing/Procedures: SCHEDULE AT [address]  Your physician has requested that you have an echocardiogram. Echocardiography is a painless test that uses sound waves to create images of your heart. It provides your doctor with information about the size and shape of your heart and how well your heart's chambers and valves are working. This procedure takes approximately one hour. There are no restrictions for this procedure. AND Your physician has recommended that you wear a holter monitor ZIO PATCH FOR 14 DAYS. Holter monitors are medical devices that record the heart's electrical activity. Doctors most often use these monitors to diagnose arrhythmias. Arrhythmias are problems with the speed or rhythm of the heartbeat. The monitor is a small, portable device. You can wear one while you do your normal daily activities. This is usually used to diagnose what is causing palpitations/syncope (passing out). AND   CT coronary calcium score.  This test is done at [address]. This is $150 out of pocket.   Coronary CalciumScan A coronary calcium scan is an imaging test used to look for deposits of calcium and other fatty materials (plaques) in the inner lining of the blood vessels of the heart (coronary arteries). These deposits of calcium and plaques can partly clog and narrow the coronary arteries without producing any symptoms or warning signs. This puts a person at risk for a heart attack.  This test can detect these deposits before symptoms develop. Tell a health care provider about:  Any allergies you have.  All medicines you are taking, including vitamins, herbs, eye drops, creams, and over-the-counter medicines.  Any problems you or family members have had with anesthetic medicines.  Any blood disorders you have.  Any surgeries you have had.  Any medical conditions you have.  Whether you are pregnant or may be pregnant. What are the risks? Generally, this is a safe procedure. However, problems may occur, including:  Harm to a pregnant woman and her unborn baby. This test involves the use of radiation. Radiation exposure can be dangerous to a pregnant woman and her unborn baby. If you are pregnant, you generally should not have this procedure done.  Slight increase in the risk of cancer. This is because of the radiation involved in the test. What happens before the procedure? No preparation is needed for this procedure. What happens during the procedure?  You will undress and remove any jewelry around your neck or chest.  You will put on a hospital gown.  Sticky electrodes will be placed on your chest. The electrodes will be connected to an electrocardiogram (ECG) machine to record a tracing of the electrical activity of your heart.  A CT scanner will take pictures of your heart. During this time, you will be asked to lie still and hold your breath for 2-3 seconds while a picture of your heart is being taken. The procedure may vary among health care providers and hospitals. What happens after the procedure?  You can get dressed.  You can return to your normal activities.  It is up to you to get the results of your test. Ask your health care provider, or the department that is doing the test, when your results will be ready. Summary  A coronary calcium scan is an imaging test used to look for deposits of calcium and other fatty materials (plaques) in the  inner lining of the blood vessels of the heart (coronary arteries).  Generally, this is a safe procedure. Tell your health care provider if you are pregnant or may be pregnant.  No preparation is needed for this procedure.  A CT scanner will take pictures of your heart.  You can return to your normal activities after the scan is done. This information is not intended to replace advice given to you by your health care provider. Make sure you discuss any questions you have with your health care provider. Document Released: 05/31/2008 Document Revised: 10/22/2016 Document Reviewed: 10/22/2016 Elsevier Interactive Patient Education  2017 [employer]      Follow-Up: At [facility], you and your health needs are our priority.  As part of our continuing mission to provide you with exceptional heart care, we have created designated Provider Care Teams.  These Care Teams include your primary Cardiologist (physician) and Advanced Practice Providers (APPs -  Physician Assistants and Nurse Practitioners) who all work together to provide you with the care you need, when you need it. . Your physician recommends that you schedule a follow-up appointment in 1 MONTH WITH DR HARDING .   Any Other Special Instructions Will Be Listed Below (If Applicable).

## 2019-01-21 NOTE — Progress Notes (Signed)
PCP: Juluis RainierBarnes, Elizabeth, MD [facility] [address] [place], [place], [id number]  Clinic Note: Chief Complaint  Patient presents with  . New Patient (Initial Visit)    Chest discomfort    HPI:  Jonathan Bass is a 58 y.o. male with PMH of Varicose Veins who is being seen today for the evaluation of Chest Pain at the request of Juluis RainierBarnes, Elizabeth, MD.  Jonathan Bass was last seen on December 25, 2018 by Dr. Zachery DauerBarnes for hospital follow-up.  He described her several episodes of chest discomfort including the one while visiting [place]  They were visiting their son.  They had gone out to eat [place] food earlier that evening.  He then had a sudden onset intense substernal chest discomfort and tightness with throbbing lasting about 3 minutes associated with sweating and shortness of breath.  He also then described episodes lasting similar manner time about once a week after that episode.  Most time occurring at night lying down or after he gets up to go to the bathroom. --He also described a different episode of symptoms where he had chest soreness on both sides radiating around to the back associated with neck and back tightness. --Despite the symptoms, he continues to stay active with his exercise regimen noted in social history.  None of these symptoms are at all associated with exertion. --He does note the symptoms are oftentimes associated with feeling of bloating sensation that is often relieved with burping.  Recent Hospitalizations:   October 24, 2018 --> [place] [facility]) - CP  (behind sternum) --> he was evaluated with BNP (15);  chemistry panel, CBC (reviewed - normal), d-dimer and troponin levels - all negative.  December 25, 2018 -- [facility]) with CTA chest.  Studies Personally Reviewed - (if available, images/films reviewed: From Epic Chart or Care Everywhere)  Jan 2019 - PE Protocol CTA. No PE.   Interval  History: Jonathan RhodyStanley presents here today for evaluation of these chest discomfort episodes.  He pretty much reiterated the symptoms noted above that he described to Dr. Zachery DauerBarnes.  He says that all the symptoms are at rest usually at night, but sometimes in the morning if he gets up to go to the bathroom.  There is no real rhyme or reason as to the timing as far as anything he can tell that he eats.  He knows that they are not exertional because he exercises all the time pretty much 5 days a week.  He says that he is pretty much been having these symptoms at least once or twice a week since November but this weekend was a little bit more pronounced he had 5 episodes on Sunday and then 1 on Tuesday morning.  (February 2 and 4).  He describes these episodes as an unusual sensation in his chest where he feels forceful pounding beats that go along with the same heart rhythm is not like they are going fast.  Just very forceful strong beats.  It can sometimes take his breath away and he feels anxious with it.  He has not had an episode as dramatic as he did when he went to the hospital in [place].  He did go to the emergency room and had a PE protocol chest CT performed that was negative.  He does note that in [place] they gave him nitroglycerin just like he [facility] and neither time that he noticed any relief.  The only thing nitroglycerin did was give him headache.  In addition to the chest discomfort he notes some straining type sensation along his right arm radiating both sides of his shoulder out to his bicep.  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  No chest pain or shortness of breath with exertion.  No PND, orthopnea or edema.  Although he feels the pounding sensation of his chest, he denies any rapid palpitations or irregular heartbeats. He denies any lightheadedness, dizziness, weakness or syncope/near syncope. No TIA/amaurosis fugax  symptoms.  No claudication.  ROS: A comprehensive was performed. Review of Systems  Constitutional: Negative for malaise/fatigue and weight loss.  HENT: Negative for nosebleeds.   Respiratory: Positive for shortness of breath (Associated with chest discomfort). Negative for cough and wheezing.   Cardiovascular: Positive for chest pain (Per HPI).  Gastrointestinal: Positive for abdominal pain (As is bloating distention feeling associated with these episodes.) and heartburn. Negative for blood in stool.  Genitourinary: Negative for flank pain and hematuria.  Musculoskeletal: Negative.   Psychiatric/Behavioral: The patient is nervous/anxious.   All other systems reviewed and are negative.  I have reviewed and (if needed) personally updated the patient's problem list, medications, allergies, past medical and surgical history, social and family history.   Past Medical History:  Diagnosis Date  . Anxiety   . Pulmonary nodule 10/2014   Noted on CT scan -> stable and follow-up 2019.  No further evaluation necessary.    Past Surgical History:  Procedure Laterality Date  . FOOT SURGERY Bilateral   . HERNIA REPAIR  2009    Current Meds  Medication Sig  . aspirin EC 81 MG tablet Take 81 mg by mouth daily.  . Multiple Vitamin (MULITIVITAMIN WITH MINERALS) TABS Take 1 tablet by mouth daily.  . propranolol (INDERAL) 10 MG tablet Take 10 mg by mouth 2 (two) times daily as needed.  . sertraline (ZOLOFT) 100 MG tablet Take 100 mg by mouth daily.    No Known Allergies  Social History   Tobacco Use  . Smoking status: Never Smoker  . Smokeless tobacco: Never Used  Substance Use Topics  . Alcohol use: No  . Drug use: No   Social History   Social History Narrative   He has been married for 37 years.  He lives with his wife.  They have 3 children and 3 grandchildren.      He says he exercises just about every day.  He does alternating upper and lower body weights as well as about 30 to  40 minutes of cardio exercise either doing stationary bicycle, elliptical or walking on treadmill.      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  He thinks this may be where he bothered his right arm.    family history includes Breast cancer in his mother; Diabetes in his maternal grandfather; Healthy in his brother and sister; Parkinson's disease in his maternal grandmother and mother; Prostate cancer in his father; Valvular heart disease in his father.  Wt Readings from Last 3 Encounters:  01/21/19 204 lb 3.2 oz (92.6 kg)  12/25/18 200 lb (90.7 kg)  07/13/15 190 lb (86.2 kg)    PHYSICAL EXAM BP 128/80 (BP Location: Left Arm, Patient Position: Sitting, Cuff Size: Large)   Pulse 80  Ht 5\' 11"  (1.803 m)   Wt 204 lb 3.2 oz (92.6 kg)   SpO2 98%   BMI 28.48 kg/m  Physical Exam  Constitutional: He is oriented to person, place, and time. He appears well-developed and well-nourished. No distress.  Healthy-appearing.  Well-groomed  HENT:  Head: Normocephalic and atraumatic.  Mouth/Throat: Oropharynx is clear and moist.  Eyes: Pupils are equal, round, and reactive to light. Conjunctivae and EOM are normal. No scleral icterus.  Neck: Normal range of motion. Neck supple. No hepatojugular reflux and no JVD present. Carotid bruit is not present. No thyromegaly present.  Cardiovascular: Normal rate, regular rhythm, normal heart sounds, intact distal pulses and normal pulses.  No extrasystoles are present. PMI is not displaced. Exam reveals no gallop and no friction rub.  No murmur heard. Pulmonary/Chest: Effort normal and breath sounds normal. No respiratory distress. He has no wheezes. He has no rales. He exhibits tenderness (Minimal parasternal tenderness).  Abdominal: Soft. Bowel sounds are normal. He exhibits no distension. There is no abdominal tenderness. There  is no rebound.  No HSM  Musculoskeletal: Normal range of motion.        General: No deformity or edema.  Lymphadenopathy:    He has no cervical adenopathy.  Neurological: He is alert and oriented to person, place, and time. No cranial nerve deficit.  Skin: Skin is warm. He is not diaphoretic.  Psychiatric: His behavior is normal. Judgment and thought content normal.  Quite anxious.  Somewhat pressured speech.  Vitals reviewed.      Adult ECG Report  Rate: 60 ;  Rhythm: normal sinus rhythm and normal axis, intervals & durations;   Narrative Interpretation: normal EKG   Other studies Reviewed: Additional studies/ records that were reviewed today include:  Recent Labs: December 25, 2018 from PCP  Na+ 138, K+ 4.6, Cl- 104, HCO3-28, BUN 20, Cr 1.4, Glu 75, Ca2+ 9.9; AST 18, ALT, 20 AlkP 65,  T Bili 0.4, TP 6.9,Alb 4.6  D-dimer 0.54; troponin I <0.01  TC 214, TG 176, HDL 44, LDL 134 [place] October 24, 2018  CBC W6.7, H/H 15.1/45.0.  Platelet 197.  BNP 15, troponin x2-.  D dimer negative.  CXR no acute cardiopulmonary abnormality   ASSESSMENT / PLAN: Problem List Items Addressed This Visit    Pounding heartbeat    Pounding heartbeat sensation that does not sound fast or irregular is probably just normal rhythm, however because the other symptoms associated, will check a 14-day ZIO patch event monitor just to exclude any presence of potential slower ectopic rhythms or other abnormalities that may explain his symptoms.      Relevant Orders   CT CARDIAC SCORING   LONG TERM MONITOR (3-14 DAYS)   ECHOCARDIOGRAM COMPLETE   Precordial chest pain - Primary    Somewhat atypical sounding symptoms.  They are all occurring at rest, not with exertion.  This would make an ischemic lesion unlikely since he is very active with exercise and denies any associated symptoms with exercise. Would like to get a baseline assessment of coronary vascular risk.  Perhaps the presence of some coronary  disease would suggest the possibility of coronary spasm as an etiology.  Would also just like to get a sense of his cardiac anatomy. Plan: Coronary calcium score, 2D echocardiogram.  Also because of pounding heartbeats, ZIO patch monitor.      Relevant Orders   CT CARDIAC SCORING   LONG TERM MONITOR (3-14 DAYS)   ECHOCARDIOGRAM COMPLETE  I spent a total of 40 minutes with the patient and chart review. >  50% of the time was spent in direct patient consultation.   Current medicines are reviewed at length with the patient today.  (+/- concerns) n/a The following changes have been made:  n/a  Patient Instructions  .Medication Instructions:  NOT NEEDED If you need a refill on your cardiac medications before your next appointment, please call your pharmacy.   Lab work: NOT NEEDED If you have labs (blood work) drawn today and your tests are completely normal, you will receive your results only by: Marland Kitchen MyChart Message (if you have MyChart) OR . A paper copy in the mail If you have any lab test that is abnormal or we need to change your treatment, we will call you to review the results.  Testing/Procedures: Orders Placed This Encounter  Procedures  . CT CARDIAC SCORING  . LONG TERM MONITOR (3-14 DAYS)  . ECHOCARDIOGRAM COMPLETE    Follow-Up: At [facility], you and your health needs are our priority.  As part of our continuing mission to provide you with exceptional heart care, we have created designated Provider Care Teams.  These Care Teams include your primary Cardiologist (physician) and Advanced Practice Providers (APPs -  Physician Assistants and Nurse Practitioners) who all work together to provide you with the care you need, when you need it. . Your physician recommends that you schedule a follow-up appointment in 1 MONTH WITH DR Letrice Pollok .    Bryan Lemma, M.D., M.S. Interventional Cardiologist   Pager # [phone] Phone # [phone] [address]  [address] [place], [place] [address]   Thank you for choosing [facility] at [facility]!!

## 2019-01-23 ENCOUNTER — Encounter: Payer: Self-pay | Admitting: Cardiology

## 2019-01-23 NOTE — Assessment & Plan Note (Signed)
Somewhat atypical sounding symptoms.  They are all occurring at rest, not with exertion.  This would make an ischemic lesion unlikely since he is very active with exercise and denies any associated symptoms with exercise. Would like to get a baseline assessment of coronary vascular risk.  Perhaps the presence of some coronary disease would suggest the possibility of coronary spasm as an etiology.  Would also just like to get a sense of his cardiac anatomy. Plan: Coronary calcium score, 2D echocardiogram.  Also because of pounding heartbeats, ZIO patch monitor.

## 2019-01-23 NOTE — Assessment & Plan Note (Signed)
Pounding heartbeat sensation that does not sound fast or irregular is probably just normal rhythm, however because the other symptoms associated, will check a 14-day ZIO patch event monitor just to exclude any presence of potential slower ectopic rhythms or other abnormalities that may explain his symptoms.

## 2019-02-03 ENCOUNTER — Ambulatory Visit (INDEPENDENT_AMBULATORY_CARE_PROVIDER_SITE_OTHER)
Admission: RE | Admit: 2019-02-03 | Discharge: 2019-02-03 | Disposition: A | Discharge status: Home / Self Care | Payer: Managed Care, Other (non HMO) | Source: Ambulatory Visit | Attending: Cardiology | Admitting: Cardiology

## 2019-02-03 ENCOUNTER — Ambulatory Visit (INDEPENDENT_AMBULATORY_CARE_PROVIDER_SITE_OTHER): Payer: 59

## 2019-02-03 ENCOUNTER — Ambulatory Visit (HOSPITAL_COMMUNITY): Payer: 59 | Attending: Internal Medicine

## 2019-02-03 DIAGNOSIS — R072 Precordial pain: Secondary | ICD-10-CM

## 2019-02-03 DIAGNOSIS — R002 Palpitations: Secondary | ICD-10-CM

## 2019-02-05 ENCOUNTER — Telehealth: Payer: Self-pay | Admitting: *Deleted

## 2019-02-05 NOTE — Telephone Encounter (Signed)
-----   Message from Marykay Lex, MD sent at 02/03/2019 10:16 PM EST ----- Echocardiogram results: Normal left ventricle size and function.  Ejection fraction 55 to 60%.  Normal wall motion.  Grade 1 diastolic dysfunction, impaired relaxation.  Not abnormal for age. No significant valve lesions.  Overall, good news.  Relatively normal study.

## 2019-02-05 NOTE — Telephone Encounter (Signed)
-----   Message from David W Harding, MD sent at 02/03/2019 10:15 PM EST ----- Great news.  Coronary calcium score is essentially 0.  This is very reassuring indicating minimal active coronary artery disease.  Continue to monitor and treat risk factors, but did not require aggressive management.   David Harding, MD   

## 2019-02-05 NOTE — Telephone Encounter (Signed)
-----   Message from Marykay Lex, MD sent at 02/03/2019 10:15 PM EST ----- Randie Heinz news.  Coronary calcium score is essentially 0.  This is very reassuring indicating minimal active coronary artery disease.  Continue to monitor and treat risk factors, but did not require aggressive management.   Bryan Lemma, MD

## 2019-02-05 NOTE — Telephone Encounter (Signed)
Left message to call back - test results - echo , ct scoring

## 2019-02-05 NOTE — Telephone Encounter (Signed)
Open area

## 2019-03-11 ENCOUNTER — Inpatient Hospital Stay (HOSPITAL_COMMUNITY): Payer: 59

## 2019-03-11 ENCOUNTER — Encounter (HOSPITAL_COMMUNITY)
Admission: EM | Disposition: A | Discharge status: Home Health | Payer: Self-pay | Source: Home / Self Care | Attending: Student

## 2019-03-11 ENCOUNTER — Inpatient Hospital Stay (HOSPITAL_COMMUNITY): Payer: 59 | Admitting: Certified Registered Nurse Anesthetist

## 2019-03-11 ENCOUNTER — Inpatient Hospital Stay (HOSPITAL_COMMUNITY)
Admission: EM | Admit: 2019-03-11 | Discharge: 2019-03-14 | DRG: 493 | Disposition: A | Discharge status: Home Health | Payer: 59 | Attending: Student | Admitting: Student

## 2019-03-11 ENCOUNTER — Encounter (HOSPITAL_COMMUNITY): Payer: Self-pay | Admitting: Emergency Medicine

## 2019-03-11 ENCOUNTER — Other Ambulatory Visit: Payer: Self-pay

## 2019-03-11 DIAGNOSIS — S52611A Displaced fracture of right ulna styloid process, initial encounter for closed fracture: Secondary | ICD-10-CM | POA: Diagnosis present

## 2019-03-11 DIAGNOSIS — S52501A Unspecified fracture of the lower end of right radius, initial encounter for closed fracture: Secondary | ICD-10-CM | POA: Diagnosis present

## 2019-03-11 DIAGNOSIS — D62 Acute posthemorrhagic anemia: Secondary | ICD-10-CM | POA: Diagnosis not present

## 2019-03-11 DIAGNOSIS — S82891B Other fracture of right lower leg, initial encounter for open fracture type I or II: Secondary | ICD-10-CM

## 2019-03-11 DIAGNOSIS — F419 Anxiety disorder, unspecified: Secondary | ICD-10-CM | POA: Diagnosis present

## 2019-03-11 DIAGNOSIS — W11XXXA Fall on and from ladder, initial encounter: Secondary | ICD-10-CM | POA: Diagnosis present

## 2019-03-11 DIAGNOSIS — Z8249 Family history of ischemic heart disease and other diseases of the circulatory system: Secondary | ICD-10-CM

## 2019-03-11 DIAGNOSIS — S82871B Displaced pilon fracture of right tibia, initial encounter for open fracture type I or II: Secondary | ICD-10-CM | POA: Diagnosis present

## 2019-03-11 DIAGNOSIS — Z7982 Long term (current) use of aspirin: Secondary | ICD-10-CM

## 2019-03-11 DIAGNOSIS — S82831A Other fracture of upper and lower end of right fibula, initial encounter for closed fracture: Secondary | ICD-10-CM | POA: Diagnosis present

## 2019-03-11 DIAGNOSIS — Z23 Encounter for immunization: Secondary | ICD-10-CM

## 2019-03-11 DIAGNOSIS — W19XXXA Unspecified fall, initial encounter: Secondary | ICD-10-CM

## 2019-03-11 DIAGNOSIS — S52571A Other intraarticular fracture of lower end of right radius, initial encounter for closed fracture: Secondary | ICD-10-CM | POA: Diagnosis present

## 2019-03-11 DIAGNOSIS — T148XXA Other injury of unspecified body region, initial encounter: Secondary | ICD-10-CM

## 2019-03-11 DIAGNOSIS — Y92009 Unspecified place in unspecified non-institutional (private) residence as the place of occurrence of the external cause: Secondary | ICD-10-CM

## 2019-03-11 DIAGNOSIS — S82871C Displaced pilon fracture of right tibia, initial encounter for open fracture type IIIA, IIIB, or IIIC: Principal | ICD-10-CM | POA: Diagnosis present

## 2019-03-11 DIAGNOSIS — S62101A Fracture of unspecified carpal bone, right wrist, initial encounter for closed fracture: Secondary | ICD-10-CM

## 2019-03-11 HISTORY — PX: ORIF WRIST FRACTURE: SHX2133

## 2019-03-11 HISTORY — PX: CLOSED REDUCTION WRIST FRACTURE: SHX1091

## 2019-03-11 HISTORY — PX: EXTERNAL FIXATION LEG: SHX1549

## 2019-03-11 HISTORY — PX: I & D EXTREMITY: SHX5045

## 2019-03-11 HISTORY — PX: ORIF ANKLE FRACTURE: SHX5408

## 2019-03-11 LAB — CBC WITH DIFFERENTIAL/PLATELET
Abs Immature Granulocytes: 0.06 10*3/uL (ref 0.00–0.07)
BASOS PCT: 0 %
Basophils Absolute: 0 10*3/uL (ref 0.0–0.1)
EOS ABS: 0.1 10*3/uL (ref 0.0–0.5)
Eosinophils Relative: 1 %
HCT: 44.9 % (ref 39.0–52.0)
Hemoglobin: 15 g/dL (ref 13.0–17.0)
Immature Granulocytes: 1 %
Lymphocytes Relative: 18 %
Lymphs Abs: 1.8 10*3/uL (ref 0.7–4.0)
MCH: 30.9 pg (ref 26.0–34.0)
MCHC: 33.4 g/dL (ref 30.0–36.0)
MCV: 92.6 fL (ref 80.0–100.0)
Monocytes Absolute: 0.5 10*3/uL (ref 0.1–1.0)
Monocytes Relative: 5 %
Neutro Abs: 7.7 10*3/uL (ref 1.7–7.7)
Neutrophils Relative %: 75 %
PLATELETS: 191 10*3/uL (ref 150–400)
RBC: 4.85 MIL/uL (ref 4.22–5.81)
RDW: 12.5 % (ref 11.5–15.5)
WBC: 10.2 10*3/uL (ref 4.0–10.5)
nRBC: 0 % (ref 0.0–0.2)

## 2019-03-11 LAB — COMPREHENSIVE METABOLIC PANEL
ALK PHOS: 65 U/L (ref 38–126)
ALT: 22 U/L (ref 0–44)
AST: 27 U/L (ref 15–41)
Albumin: 4 g/dL (ref 3.5–5.0)
Anion gap: 7 (ref 5–15)
BUN: 15 mg/dL (ref 6–20)
CALCIUM: 9.3 mg/dL (ref 8.9–10.3)
CO2: 23 mmol/L (ref 22–32)
Chloride: 107 mmol/L (ref 98–111)
Creatinine, Ser: 1.13 mg/dL (ref 0.61–1.24)
GFR calc non Af Amer: >60 mL/min (ref >60)
GLUCOSE: 103 mg/dL — AB (ref 70–99)
Potassium: 4 mmol/L (ref 3.5–5.1)
Sodium: 137 mmol/L (ref 135–145)
Total Bilirubin: 0.5 mg/dL (ref 0.3–1.2)
Total Protein: 6.4 g/dL — ABNORMAL LOW (ref 6.5–8.1)

## 2019-03-11 SURGERY — IRRIGATION AND DEBRIDEMENT EXTREMITY
Anesthesia: General | Site: Leg Lower | Laterality: Right

## 2019-03-11 MED ORDER — SUGAMMADEX SODIUM 200 MG/2ML IV SOLN
INTRAVENOUS | Status: DC | PRN
  Administered 2019-03-11 (×2): 50 mg via INTRAVENOUS

## 2019-03-11 MED ORDER — METHOCARBAMOL 1000 MG/10ML IJ SOLN
500.0000 mg | Freq: Four times a day (QID) | INTRAVENOUS | Status: DC | PRN
  Administered 2019-03-11: 500 mg via INTRAVENOUS
  Filled 2019-03-11: qty 5

## 2019-03-11 MED ORDER — CEFAZOLIN SODIUM-DEXTROSE 2-4 GM/100ML-% IV SOLN
2.0000 g | Freq: Once | INTRAVENOUS | Status: AC
Start: 2019-03-11 — End: 2019-03-11
  Administered 2019-03-11: 2 g via INTRAVENOUS
  Filled 2019-03-11: qty 100

## 2019-03-11 MED ORDER — OXYCODONE HCL 5 MG PO TABS
5.0000 mg | ORAL_TABLET | ORAL | Status: DC | PRN
  Administered 2019-03-12 – 2019-03-13 (×7): 15 mg via ORAL
  Administered 2019-03-13: 10 mg via ORAL
  Administered 2019-03-13 (×2): 15 mg via ORAL
  Administered 2019-03-14: 10 mg via ORAL
  Administered 2019-03-14 (×2): 15 mg via ORAL
  Filled 2019-03-11 (×4): qty 3
  Filled 2019-03-11 (×2): qty 2
  Filled 2019-03-11 (×7): qty 3

## 2019-03-11 MED ORDER — MIDAZOLAM HCL 2 MG/2ML IJ SOLN
INTRAMUSCULAR | Status: AC
  Filled 2019-03-11: qty 2

## 2019-03-11 MED ORDER — SODIUM CHLORIDE 0.9 % IV SOLN
INTRAVENOUS | Status: DC | PRN
  Administered 2019-03-11: 10 ug/min via INTRAVENOUS

## 2019-03-11 MED ORDER — BUPIVACAINE HCL (PF) 0.5 % IJ SOLN
INTRAMUSCULAR | Status: AC
  Filled 2019-03-11: qty 30

## 2019-03-11 MED ORDER — FENTANYL CITRATE (PF) 250 MCG/5ML IJ SOLN
INTRAMUSCULAR | Status: DC | PRN
  Administered 2019-03-11 (×2): 50 ug via INTRAVENOUS
  Administered 2019-03-11: 100 ug via INTRAVENOUS
  Administered 2019-03-11: 50 ug via INTRAVENOUS

## 2019-03-11 MED ORDER — ACETAMINOPHEN 325 MG PO TABS
650.0000 mg | ORAL_TABLET | Freq: Four times a day (QID) | ORAL | Status: DC
  Administered 2019-03-12 – 2019-03-14 (×10): 650 mg via ORAL
  Filled 2019-03-11 (×10): qty 2

## 2019-03-11 MED ORDER — POVIDONE-IODINE 10 % EX SWAB: 2.0000 "application " | Freq: Once | CUTANEOUS | Status: DC

## 2019-03-11 MED ORDER — VANCOMYCIN HCL 1000 MG IV SOLR
INTRAVENOUS | Status: AC
  Filled 2019-03-11: qty 1000

## 2019-03-11 MED ORDER — SODIUM CHLORIDE 0.9 % IV SOLN
2.0000 g | INTRAVENOUS | Status: AC
  Administered 2019-03-12 (×2): 2 g via INTRAVENOUS
  Filled 2019-03-11 (×3): qty 20

## 2019-03-11 MED ORDER — LACTATED RINGERS IV SOLN
INTRAVENOUS | Status: DC | PRN
  Administered 2019-03-11 (×2): via INTRAVENOUS

## 2019-03-11 MED ORDER — POTASSIUM CHLORIDE IN NACL 20-0.45 MEQ/L-% IV SOLN
INTRAVENOUS | Status: DC
  Filled 2019-03-11: qty 1000

## 2019-03-11 MED ORDER — PHENYLEPHRINE HCL 10 MG/ML IJ SOLN
INTRAMUSCULAR | Status: DC | PRN
  Administered 2019-03-11: 80 ug via INTRAVENOUS
  Administered 2019-03-11: 120 ug via INTRAVENOUS

## 2019-03-11 MED ORDER — SODIUM CHLORIDE 0.9 % IR SOLN
Status: DC | PRN
  Administered 2019-03-11: 3000 mL

## 2019-03-11 MED ORDER — MORPHINE SULFATE (PF) 2 MG/ML IV SOLN: 2.0000 mg | INTRAVENOUS | Status: DC | PRN

## 2019-03-11 MED ORDER — ROCURONIUM BROMIDE 50 MG/5ML IV SOSY
PREFILLED_SYRINGE | INTRAVENOUS | Status: AC
  Filled 2019-03-11: qty 5

## 2019-03-11 MED ORDER — MIDAZOLAM HCL 2 MG/2ML IJ SOLN
INTRAMUSCULAR | Status: DC | PRN
  Administered 2019-03-11: 2 mg via INTRAVENOUS

## 2019-03-11 MED ORDER — OXYCODONE HCL 5 MG PO TABS
ORAL_TABLET | ORAL | Status: AC
  Administered 2019-03-11: 5 mg via ORAL
  Filled 2019-03-11: qty 1

## 2019-03-11 MED ORDER — BACITRACIN ZINC 500 UNIT/GM EX OINT
TOPICAL_OINTMENT | CUTANEOUS | Status: AC
  Filled 2019-03-11: qty 28.35

## 2019-03-11 MED ORDER — FENTANYL CITRATE (PF) 100 MCG/2ML IJ SOLN
INTRAMUSCULAR | Status: AC
  Administered 2019-03-11: 50 ug via INTRAVENOUS
  Filled 2019-03-11: qty 2

## 2019-03-11 MED ORDER — CEFAZOLIN SODIUM-DEXTROSE 2-4 GM/100ML-% IV SOLN
2.0000 g | INTRAVENOUS | Status: DC
  Filled 2019-03-11: qty 100

## 2019-03-11 MED ORDER — VITAMIN D 25 MCG (1000 UNIT) PO TABS
4000.0000 [IU] | ORAL_TABLET | Freq: Every day | ORAL | Status: DC
  Administered 2019-03-12 – 2019-03-14 (×3): 4000 [IU] via ORAL
  Filled 2019-03-11 (×3): qty 4

## 2019-03-11 MED ORDER — MORPHINE SULFATE (PF) 2 MG/ML IV SOLN
2.0000 mg | INTRAVENOUS | Status: DC | PRN
  Administered 2019-03-12 – 2019-03-14 (×4): 2 mg via INTRAVENOUS
  Filled 2019-03-11 (×4): qty 1

## 2019-03-11 MED ORDER — ACETAMINOPHEN 160 MG/5ML PO SOLN: 1000.0000 mg | Freq: Once | ORAL | Status: DC | PRN

## 2019-03-11 MED ORDER — ONDANSETRON HCL 4 MG/2ML IJ SOLN
INTRAMUSCULAR | Status: DC | PRN
  Administered 2019-03-11: 4 mg via INTRAVENOUS

## 2019-03-11 MED ORDER — ONDANSETRON HCL 4 MG/2ML IJ SOLN: 4.0000 mg | Freq: Four times a day (QID) | INTRAMUSCULAR | Status: DC | PRN

## 2019-03-11 MED ORDER — FENTANYL CITRATE (PF) 250 MCG/5ML IJ SOLN
INTRAMUSCULAR | Status: AC
  Filled 2019-03-11: qty 5

## 2019-03-11 MED ORDER — VITAMIN C 500 MG PO TABS
500.0000 mg | ORAL_TABLET | Freq: Every day | ORAL | Status: DC
  Administered 2019-03-12 – 2019-03-14 (×3): 500 mg via ORAL
  Filled 2019-03-11 (×3): qty 1

## 2019-03-11 MED ORDER — SERTRALINE HCL 100 MG PO TABS
100.0000 mg | ORAL_TABLET | Freq: Every day | ORAL | Status: DC
  Administered 2019-03-12 – 2019-03-14 (×3): 100 mg via ORAL
  Filled 2019-03-11 (×3): qty 1

## 2019-03-11 MED ORDER — CHLORHEXIDINE GLUCONATE 4 % EX LIQD: 60.0000 mL | Freq: Once | CUTANEOUS | Status: DC

## 2019-03-11 MED ORDER — VANCOMYCIN HCL 1000 MG IV SOLR
INTRAVENOUS | Status: DC | PRN
  Administered 2019-03-11: 1000 mg via TOPICAL

## 2019-03-11 MED ORDER — TOBRAMYCIN SULFATE 1.2 G IJ SOLR
INTRAMUSCULAR | Status: DC | PRN
  Administered 2019-03-11: 1.2 g via TOPICAL

## 2019-03-11 MED ORDER — FENTANYL CITRATE (PF) 100 MCG/2ML IJ SOLN
25.0000 ug | INTRAMUSCULAR | Status: DC | PRN
  Administered 2019-03-11 (×2): 50 ug via INTRAVENOUS

## 2019-03-11 MED ORDER — GABAPENTIN 300 MG PO CAPS
300.0000 mg | ORAL_CAPSULE | Freq: Three times a day (TID) | ORAL | Status: DC
  Administered 2019-03-12 – 2019-03-14 (×8): 300 mg via ORAL
  Filled 2019-03-11 (×8): qty 1

## 2019-03-11 MED ORDER — LIDOCAINE 2% (20 MG/ML) 5 ML SYRINGE
INTRAMUSCULAR | Status: DC | PRN
  Administered 2019-03-11: 60 mg via INTRAVENOUS

## 2019-03-11 MED ORDER — DEXMEDETOMIDINE HCL IN NACL 200 MCG/50ML IV SOLN
INTRAVENOUS | Status: DC | PRN
  Administered 2019-03-11 (×4): 4 ug via INTRAVENOUS

## 2019-03-11 MED ORDER — PHENYLEPHRINE 40 MCG/ML (10ML) SYRINGE FOR IV PUSH (FOR BLOOD PRESSURE SUPPORT)
PREFILLED_SYRINGE | INTRAVENOUS | Status: AC
  Filled 2019-03-11: qty 10

## 2019-03-11 MED ORDER — TETANUS-DIPHTH-ACELL PERTUSSIS 5-2.5-18.5 LF-MCG/0.5 IM SUSP
0.5000 mL | Freq: Once | INTRAMUSCULAR | Status: AC
  Administered 2019-03-11: 0.5 mL via INTRAMUSCULAR
  Filled 2019-03-11: qty 0.5

## 2019-03-11 MED ORDER — ONDANSETRON HCL 4 MG/2ML IJ SOLN
INTRAMUSCULAR | Status: AC
  Filled 2019-03-11: qty 2

## 2019-03-11 MED ORDER — ACETAMINOPHEN 500 MG PO TABS: 1000.0000 mg | ORAL_TABLET | Freq: Once | ORAL | Status: DC | PRN

## 2019-03-11 MED ORDER — PROPRANOLOL HCL 20 MG PO TABS: 10.0000 mg | ORAL_TABLET | Freq: Two times a day (BID) | ORAL | Status: DC | PRN

## 2019-03-11 MED ORDER — CEFAZOLIN SODIUM-DEXTROSE 2-3 GM-%(50ML) IV SOLR
INTRAVENOUS | Status: DC | PRN
  Administered 2019-03-11: 2 g via INTRAVENOUS

## 2019-03-11 MED ORDER — TOBRAMYCIN SULFATE 1.2 G IJ SOLR
INTRAMUSCULAR | Status: AC
  Filled 2019-03-11: qty 1.2

## 2019-03-11 MED ORDER — ACETAMINOPHEN 10 MG/ML IV SOLN
1000.0000 mg | Freq: Once | INTRAVENOUS | Status: DC | PRN
  Administered 2019-03-11: 1000 mg via INTRAVENOUS

## 2019-03-11 MED ORDER — PROPOFOL 10 MG/ML IV BOLUS
INTRAVENOUS | Status: AC
  Filled 2019-03-11: qty 20

## 2019-03-11 MED ORDER — METHOCARBAMOL 500 MG PO TABS
500.0000 mg | ORAL_TABLET | Freq: Four times a day (QID) | ORAL | Status: DC | PRN
  Administered 2019-03-12 – 2019-03-14 (×7): 500 mg via ORAL
  Filled 2019-03-11 (×7): qty 1

## 2019-03-11 MED ORDER — OXYCODONE HCL 5 MG/5ML PO SOLN: 5.0000 mg | Freq: Once | ORAL | Status: AC | PRN

## 2019-03-11 MED ORDER — MORPHINE SULFATE (PF) 4 MG/ML IV SOLN
6.0000 mg | Freq: Once | INTRAVENOUS | Status: AC
  Administered 2019-03-11: 6 mg via INTRAVENOUS
  Filled 2019-03-11: qty 2

## 2019-03-11 MED ORDER — PROPOFOL 10 MG/ML IV BOLUS
INTRAVENOUS | Status: DC | PRN
  Administered 2019-03-11: 200 mg via INTRAVENOUS

## 2019-03-11 MED ORDER — 0.9 % SODIUM CHLORIDE (POUR BTL) OPTIME
TOPICAL | Status: DC | PRN
  Administered 2019-03-11: 1000 mL

## 2019-03-11 MED ORDER — ACETAMINOPHEN 10 MG/ML IV SOLN
INTRAVENOUS | Status: AC
  Administered 2019-03-11: 1000 mg via INTRAVENOUS
  Filled 2019-03-11: qty 100

## 2019-03-11 MED ORDER — ACETAMINOPHEN 325 MG PO TABS: 650.0000 mg | ORAL_TABLET | Freq: Four times a day (QID) | ORAL | Status: DC

## 2019-03-11 MED ORDER — ONDANSETRON HCL 4 MG PO TABS: 4.0000 mg | ORAL_TABLET | Freq: Four times a day (QID) | ORAL | Status: DC | PRN

## 2019-03-11 MED ORDER — ROCURONIUM BROMIDE 50 MG/5ML IV SOSY
PREFILLED_SYRINGE | INTRAVENOUS | Status: DC | PRN
  Administered 2019-03-11: 50 mg via INTRAVENOUS
  Administered 2019-03-11 (×2): 10 mg via INTRAVENOUS

## 2019-03-11 MED ORDER — ENOXAPARIN SODIUM 40 MG/0.4ML ~~LOC~~ SOLN
40.0000 mg | SUBCUTANEOUS | Status: DC
  Administered 2019-03-12 – 2019-03-14 (×3): 40 mg via SUBCUTANEOUS
  Filled 2019-03-11 (×3): qty 0.4

## 2019-03-11 MED ORDER — SUCCINYLCHOLINE CHLORIDE 200 MG/10ML IV SOSY
PREFILLED_SYRINGE | INTRAVENOUS | Status: AC
  Filled 2019-03-11: qty 10

## 2019-03-11 MED ORDER — OXYCODONE HCL 5 MG PO TABS
5.0000 mg | ORAL_TABLET | Freq: Once | ORAL | Status: AC | PRN
  Administered 2019-03-11: 5 mg via ORAL

## 2019-03-11 MED ORDER — DEXAMETHASONE SODIUM PHOSPHATE 10 MG/ML IJ SOLN
INTRAMUSCULAR | Status: AC
  Filled 2019-03-11: qty 1

## 2019-03-11 MED ORDER — DEXAMETHASONE SODIUM PHOSPHATE 10 MG/ML IJ SOLN
INTRAMUSCULAR | Status: DC | PRN
  Administered 2019-03-11: 10 mg via INTRAVENOUS

## 2019-03-11 MED ORDER — EPHEDRINE SULFATE 50 MG/ML IJ SOLN
INTRAMUSCULAR | Status: DC | PRN
  Administered 2019-03-11: 10 mg via INTRAVENOUS
  Administered 2019-03-11: 15 mg via INTRAVENOUS
  Administered 2019-03-11: 10 mg via INTRAVENOUS

## 2019-03-11 MED ORDER — SODIUM CHLORIDE 0.9 % IV SOLN: 2.0000 g | Freq: Once | INTRAVENOUS | Status: DC

## 2019-03-11 MED ORDER — LIDOCAINE 2% (20 MG/ML) 5 ML SYRINGE
INTRAMUSCULAR | Status: AC
  Filled 2019-03-11: qty 5

## 2019-03-11 MED ORDER — SUCCINYLCHOLINE CHLORIDE 200 MG/10ML IV SOSY
PREFILLED_SYRINGE | INTRAVENOUS | Status: DC | PRN
  Administered 2019-03-11: 140 mg via INTRAVENOUS

## 2019-03-11 SURGICAL SUPPLY — 102 items
BANDAGE ACE 4X5 VEL STRL LF (GAUZE/BANDAGES/DRESSINGS) ×4 IMPLANT
BANDAGE ACE 6X5 VEL STRL LF (GAUZE/BANDAGES/DRESSINGS) ×4 IMPLANT
BANDAGE ELASTIC 3 VELCRO ST LF (GAUZE/BANDAGES/DRESSINGS) ×4 IMPLANT
BANDAGE ELASTIC 4 VELCRO ST LF (GAUZE/BANDAGES/DRESSINGS) ×8 IMPLANT
BANDAGE ESMARK 6X9 LF (GAUZE/BANDAGES/DRESSINGS) ×2 IMPLANT
BIT DRILL 100X2XQC STRL (BIT) ×2 IMPLANT
BIT DRILL 2.5X110 QC LCP DISP (BIT) ×4 IMPLANT
BIT DRILL CALIBRATED 1.8MM (BIT) ×2 IMPLANT
BIT DRILL QC 2.0X100 (BIT) ×2
BIT DRILL QC 3.5X195 (BIT) ×8 IMPLANT
BIT DRL 100X2XQC STRL (BIT) ×2
BNDG COHESIVE 4X5 TAN STRL (GAUZE/BANDAGES/DRESSINGS) ×4 IMPLANT
BNDG ESMARK 6X9 LF (GAUZE/BANDAGES/DRESSINGS) ×4
BNDG GAUZE ELAST 4 BULKY (GAUZE/BANDAGES/DRESSINGS) ×4 IMPLANT
BRUSH SCRUB SURG 4.25 DISP (MISCELLANEOUS) ×8 IMPLANT
CANISTER WOUNDNEG PRESSURE 500 (CANNISTER) ×4 IMPLANT
CHLORAPREP W/TINT 26ML (MISCELLANEOUS) ×4 IMPLANT
CLAMP COMBO MED CLIP-ON SELF (EXFIX) ×16 IMPLANT
CLOSURE WOUND 1/2 X4 (GAUZE/BANDAGES/DRESSINGS)
COVER MAYO STAND STRL (DRAPES) ×4 IMPLANT
COVER SURGICAL LIGHT HANDLE (MISCELLANEOUS) ×8 IMPLANT
COVER WAND RF STERILE (DRAPES) ×4 IMPLANT
DISTAL RADIUS VA LCP 7H/4H LF (Orthopedic Implant) ×4 IMPLANT
DRAPE C-ARM 42X72 X-RAY (DRAPES) ×4 IMPLANT
DRAPE C-ARMOR (DRAPES) ×4 IMPLANT
DRAPE EXTREMITY T 121X128X90 (DISPOSABLE) ×4 IMPLANT
DRAPE IMP U-DRAPE 54X76 (DRAPES) ×8 IMPLANT
DRAPE ORTHO SPLIT 77X108 STRL (DRAPES) ×4
DRAPE SURG 17X23 STRL (DRAPES) ×4 IMPLANT
DRAPE SURG ORHT 6 SPLT 77X108 (DRAPES) ×4 IMPLANT
DRAPE U-SHAPE 47X51 STRL (DRAPES) ×4 IMPLANT
DRILL CALIBRATED 1.8MM (BIT) ×4
DRSG ADAPTIC 3X8 NADH LF (GAUZE/BANDAGES/DRESSINGS) ×8 IMPLANT
DRSG MEPITEL 4X7.2 (GAUZE/BANDAGES/DRESSINGS) IMPLANT
DRSG VAC ATS MED SENSATRAC (GAUZE/BANDAGES/DRESSINGS) ×4 IMPLANT
ELECT REM PT RETURN 9FT ADLT (ELECTROSURGICAL) ×4
ELECTRODE REM PT RTRN 9FT ADLT (ELECTROSURGICAL) ×2 IMPLANT
EVACUATOR 1/8 PVC DRAIN (DRAIN) IMPLANT
GAUZE SPONGE 4X4 12PLY STRL (GAUZE/BANDAGES/DRESSINGS) ×4 IMPLANT
GLOVE BIO SURGEON STRL SZ 6.5 (GLOVE) ×9 IMPLANT
GLOVE BIO SURGEON STRL SZ7.5 (GLOVE) ×16 IMPLANT
GLOVE BIO SURGEONS STRL SZ 6.5 (GLOVE) ×3
GLOVE BIOGEL PI IND STRL 6.5 (GLOVE) ×2 IMPLANT
GLOVE BIOGEL PI IND STRL 7.5 (GLOVE) ×2 IMPLANT
GLOVE BIOGEL PI INDICATOR 6.5 (GLOVE) ×2
GLOVE BIOGEL PI INDICATOR 7.5 (GLOVE) ×2
GOWN STRL REUS W/ TWL LRG LVL3 (GOWN DISPOSABLE) ×4 IMPLANT
GOWN STRL REUS W/TWL LRG LVL3 (GOWN DISPOSABLE) ×4
HANDPIECE INTERPULSE COAX TIP (DISPOSABLE) ×2
K-WIRE 1.6 (WIRE) ×2
K-WIRE FX5X1.6XNS BN SS (WIRE) ×2
KIT BASIN OR (CUSTOM PROCEDURE TRAY) ×4 IMPLANT
KIT TURNOVER KIT B (KITS) ×4 IMPLANT
KWIRE FX5X1.6XNS BN SS (WIRE) ×2 IMPLANT
MANIFOLD NEPTUNE II (INSTRUMENTS) ×4 IMPLANT
NEEDLE 22X1 1/2 (OR ONLY) (NEEDLE) ×4 IMPLANT
NEEDLE 25GAX1.5 (MISCELLANEOUS) ×4 IMPLANT
NEEDLE HYPO 21X1.5 SAFETY (NEEDLE) IMPLANT
NS IRRIG 1000ML POUR BTL (IV SOLUTION) ×4 IMPLANT
PACK ORTHO EXTREMITY (CUSTOM PROCEDURE TRAY) ×4 IMPLANT
PACK TOTAL JOINT (CUSTOM PROCEDURE TRAY) ×4 IMPLANT
PAD ARMBOARD 7.5X6 YLW CONV (MISCELLANEOUS) ×8 IMPLANT
PAD CAST 4YDX4 CTTN HI CHSV (CAST SUPPLIES) IMPLANT
PADDING CAST COTTON 4X4 STRL (CAST SUPPLIES)
PADDING CAST COTTON 6X4 STRL (CAST SUPPLIES) ×8 IMPLANT
PIN BUTTRESS 20MM (Pin) ×12 IMPLANT
PIN BUTTRESS LOCK 1.8X18 (Pin) ×4 IMPLANT
PIN BUTTRESS LOCK 1.8X22MM (PIN) ×4 IMPLANT
PLATE LCP RADIUS DIST 7H/4H LF (Orthopedic Implant) ×2 IMPLANT
ROD CARBON FIBER 8.0MMX200MM (Rod) ×4 IMPLANT
ROD CARBON FIBER 8.0X160MM (EXFIX) ×4 IMPLANT
SCREW CORTEX 2.7 SLF-TPNG 16MM (Screw) ×12 IMPLANT
SCREW CORTEX SLFTPNG 24MM 2.4 (Screw) ×4 IMPLANT
SCREW LOCKING 2.4X22 (Screw) ×4 IMPLANT
SCREW SELF TAP 14MM (Screw) ×4 IMPLANT
SCREW SHANZ 4.0X100 (EXFIX) ×8 IMPLANT
SCREW VA LOCKING 2.4X18 (Screw) ×8 IMPLANT
SET HNDPC FAN SPRY TIP SCT (DISPOSABLE) ×2 IMPLANT
SPLINT FIBERGLASS 3X35 (CAST SUPPLIES) ×4 IMPLANT
SPONGE LAP 18X18 RF (DISPOSABLE) ×4 IMPLANT
STAPLER VISISTAT 35W (STAPLE) ×4 IMPLANT
STRIP CLOSURE SKIN 1/2X4 (GAUZE/BANDAGES/DRESSINGS) IMPLANT
SUCTION FRAZIER HANDLE 10FR (MISCELLANEOUS) ×2
SUCTION TUBE FRAZIER 10FR DISP (MISCELLANEOUS) ×2 IMPLANT
SUT ETHILON 2 0 FS 18 (SUTURE) IMPLANT
SUT ETHILON 3 0 PS 1 (SUTURE) ×8 IMPLANT
SUT MON AB 2-0 CT1 36 (SUTURE) ×4 IMPLANT
SUT PROLENE 4 0 PS 2 18 (SUTURE) ×4 IMPLANT
SUT VIC AB 0 CT1 27 (SUTURE) ×2
SUT VIC AB 0 CT1 27XBRD ANBCTR (SUTURE) ×2 IMPLANT
SUT VIC AB 2-0 CT1 27 (SUTURE) ×4
SUT VIC AB 2-0 CT1 TAPERPNT 27 (SUTURE) ×4 IMPLANT
SUT VIC AB 4-0 PS2 27 (SUTURE) ×4 IMPLANT
SWAB CULTURE ESWAB REG 1ML (MISCELLANEOUS) IMPLANT
SYR CONTROL 10ML LL (SYRINGE) ×4 IMPLANT
TOWEL OR 17X24 6PK STRL BLUE (TOWEL DISPOSABLE) ×8 IMPLANT
TOWEL OR 17X26 10 PK STRL BLUE (TOWEL DISPOSABLE) ×8 IMPLANT
TUBE CONNECTING 12'X1/4 (SUCTIONS) ×1
TUBE CONNECTING 12X1/4 (SUCTIONS) ×3 IMPLANT
UNDERPAD 30X30 (UNDERPADS AND DIAPERS) ×4 IMPLANT
WATER STERILE IRR 1000ML POUR (IV SOLUTION) ×4 IMPLANT
YANKAUER SUCT BULB TIP NO VENT (SUCTIONS) ×4 IMPLANT

## 2019-03-11 NOTE — Anesthesia Postprocedure Evaluation (Signed)
Anesthesia Post Note  Patient: Jonathan Bass  Procedure(s) Performed: IRRIGATION AND DEBRIDEMENT EXTREMITY (Right Leg Lower) EXTERNAL FIXATION LEG (Right Leg Lower) OPEN REDUCTION INTERNAL FIXATION (ORIF) ANKLE FRACTURE (Right Leg Lower) OPEN REDUCTION INTERNAL FIXATION (ORIF) WRIST FRACTURE (Right Arm Lower) CLOSED REDUCTION WRIST (Right Arm Lower)     Patient location during evaluation: PACU Anesthesia Type: General Level of consciousness: awake and alert Pain management: pain level controlled Vital Signs Assessment: post-procedure vital signs reviewed and stable Respiratory status: spontaneous breathing, nonlabored ventilation, respiratory function stable and patient connected to nasal cannula oxygen Cardiovascular status: blood pressure returned to baseline and stable Postop Assessment: no apparent nausea or vomiting Anesthetic complications: no    Last Vitals:  Vitals:   03/11/19 2133 03/11/19 2149  BP: 139/86 136/87  Pulse: 84 85  Resp: 12 16  Temp: 37.1 C 36.6 C  SpO2: 98% 100%    Last Pain:  Vitals:   03/11/19 2149  TempSrc: Oral  PainSc:                  [name] COKER

## 2019-03-11 NOTE — ED Provider Notes (Signed)
[facility] EMERGENCY DEPARTMENT Provider Note   CSN: [id number] Arrival date & time: 03/11/19  1539    History   Chief Complaint Chief Complaint  Patient presents with  . Fall  . Leg Injury  . Arm Injury    HPI Jonathan Bass is a 58 y.o. male presented to emergency department today with chief complaint of fall.  Patient was standing on a ladder while painting his house and when stepping down he felt the ladder shake and tipped over causing him to fall landing on concrete.  He landed on his right side.  Patient has pain in his right wrist and right ankle.  Right ankle with obvious open fracture.  Right wrist with obvious closed deformity.  Patient estimates he fell 5 feet.  He did not hit his head and did not lose consciousness.  Patient states his wife was home and witnessed the fall.   He is unsure of last tetanus vaccine.  Patient states he has pain in his right wrist and right ankle.  He describes the pain as an ache and rates it 7 out of 10 in severity.  Pain is worse with movement.  Patient denies any head, neck, or back pain, chest pain, shortness of breath. EMS gave 200 mcgfentanyl in route.  Pt is right hand dominant. History provided by patient.       Past Medical History:  Diagnosis Date  . Anxiety   . Pulmonary nodule 10/2014   Noted on CT scan -> stable and follow-up 2019.  No further evaluation necessary.    Patient Active Problem List   Diagnosis Date Noted  . Open right ankle fracture 03/11/2019  . Pounding heartbeat 01/21/2019  . Precordial chest pain 03/05/2014    Past Surgical History:  Procedure Laterality Date  . FOOT SURGERY Bilateral   . HERNIA REPAIR  2009  . TONSILLECTOMY          Home Medications    Prior to Admission medications   Medication Sig Start Date End Date Taking? Authorizing Provider  aspirin EC 81 MG tablet Take 81 mg by mouth daily.    [provider]  Multiple Vitamin (MULITIVITAMIN WITH  MINERALS) TABS Take 1 tablet by mouth daily.    [provider]  propranolol (INDERAL) 10 MG tablet Take 10 mg by mouth 2 (two) times daily as needed.    [provider]  sertraline (ZOLOFT) 100 MG tablet Take 100 mg by mouth daily.    [provider]    Family History Family History  Problem Relation Age of Onset  . Prostate cancer Father   . Valvular heart disease Father        Had a heart valve replaced (unsure of details)  . Breast cancer Mother   . Parkinson's disease Mother   . Healthy Sister   . Healthy Brother   . Parkinson's disease Maternal Grandmother   . Diabetes Maternal Grandfather     Social History Social History   Tobacco Use  . Smoking status: Never Smoker  . Smokeless tobacco: Never Used  Substance Use Topics  . Alcohol use: No  . Drug use: No     Allergies   Patient has no known allergies.   Review of Systems Review of Systems  Musculoskeletal: Positive for arthralgias.  Skin: Positive for wound.  All other systems reviewed and are negative.    Physical Exam Updated Vital Signs BP 135/84   Pulse 64   Temp Marland Kitchen)  97.5 F (36.4 C) (Oral)   Resp 15   Ht 5\' 11"  (1.803 m)   Wt 90.7 kg   SpO2 98%   BMI 27.89 kg/m   Physical Exam Vitals signs and nursing note reviewed.  Constitutional:      Appearance: He is well-developed. He is not toxic-appearing.  HENT:     Head: Normocephalic and atraumatic.     Comments: Non tender to palpation of head. No lacerations, ecchymosis, wounds noted.    Nose: Nose normal.     Mouth/Throat:     Mouth: Mucous membranes are moist.     Pharynx: Oropharynx is clear.  Eyes:     General: No scleral icterus.       Right eye: No discharge.        Left eye: No discharge.     Extraocular Movements: Extraocular movements intact.     Conjunctiva/sclera: Conjunctivae normal.     Pupils: Pupils are equal, round, and reactive to light.  Neck:     Musculoskeletal: Normal range of motion.      Comments: Full ROM intact without spinous process TTP. No bony stepoffs or deformities, no paraspinous muscle TTP or muscle spasms. No bruising, erythema, or swelling.  Cardiovascular:     Rate and Rhythm: Normal rate and regular rhythm.     Pulses: Normal pulses.          Radial pulses are 2+ on the right side and 2+ on the left side.     Heart sounds: Normal heart sounds.  Pulmonary:     Effort: Pulmonary effort is normal.     Breath sounds: Normal breath sounds.  Abdominal:     General: There is no distension.     Palpations: Abdomen is soft.  Musculoskeletal: Normal range of motion.     Comments: Open fracture of right ankle.  Faint DP pulse, confirmed with doppler. Sensation intact to right lower extremity. Pt can move his toes. Non tender to palpation of right tibia and right knee.  Right wrist with obvious closed deformity.  Nontender of right forearm, elbow, and shoulder. Right radial pulse 2+. Sensation intact to right upper extremity. Pt able to move fingers. Neurovascularly intact distally. Compartments soft above and below affected joint.    Skin:    General: Skin is warm and dry.     Capillary Refill: Capillary refill takes less than 2 seconds.  Neurological:     Mental Status: He is alert and oriented to person, place, and time.     Comments: Fluent speech, no facial droop.  Psychiatric:        Behavior: Behavior normal.      ED Treatments / Results  Labs (all labs ordered are listed, but only abnormal results are displayed) Labs Reviewed  COMPREHENSIVE METABOLIC PANEL - Abnormal; Notable for the following components:      Result Value   Glucose, Bld 103 (*)    Total Protein 6.4 (*)    All other components within normal limits  CBC WITH DIFFERENTIAL/PLATELET  HIV ANTIBODY (ROUTINE TESTING W REFLEX)    EKG None  Radiology Dg Forearm Right  Result Date: 03/11/2019 CLINICAL DATA:  Fall from 6 foot ladder with forearm pain, initial encounter EXAM: RIGHT  FOREARM - 2 VIEW COMPARISON:  None. FINDINGS: There is a comminuted fracture of the distal radius with extension into the articular surface and impaction at the fracture site. Ulnar styloid avulsion is noted as well. The more proximal radius and ulna are  within normal limits. IMPRESSION: Distal radial and ulnar fractures as described. Electronically Signed   By: Alcide Clever M.D.   On: 03/11/2019 17:02   Dg Wrist Complete Right  Result Date: 03/11/2019 CLINICAL DATA:  Fall from ladder with wrist pain, initial encounter EXAM: RIGHT WRIST - COMPLETE 3+ VIEW COMPARISON:  None. FINDINGS: Comminuted distal radial fracture is noted with intra-articular involvement and impaction at the fracture site. Ulnar styloid fracture is noted as well. No carpal abnormality is seen. IMPRESSION: Distal radial and ulnar fractures with intra-articular involvement of the radial fracture. Electronically Signed   By: Alcide Clever M.D.   On: 03/11/2019 17:06   Dg Tibia/fibula Right  Result Date: 03/11/2019 CLINICAL DATA:  Fall from ladder with right lower leg pain, initial encounter EXAM: RIGHT TIBIA AND FIBULA - 2 VIEW COMPARISON:  None. FINDINGS: Proximal tibia and fibula appear within normal limits. No joint effusion is seen. Previously seen comminuted fractures of the distal tibia and fibular noted with lateral angulation of the distal fracture fragments. Some impaction is noted at the fracture site as well. The tibial fracture appears to extend into the articular surface with separation of the fragments and impaction of the talus. IMPRESSION: Comminuted distal tibial and fibular fractures as described. Electronically Signed   By: Alcide Clever M.D.   On: 03/11/2019 17:04   Dg Ankle Complete Right  Result Date: 03/11/2019 CLINICAL DATA:  Fall from ladder with ankle pain, initial encounter EXAM: RIGHT ANKLE - COMPLETE 3+ VIEW COMPARISON:  None. FINDINGS: Postsurgical changes are noted in the tarsal bones. There are comminuted  fractures of both the distal tibial metaphysis and distal fibular diaphysis with lateral angulation of the distal fracture fragments. The distal tibial fracture appears to extend to the articular surface with separation of the fracture fragments and impaction of the talus. considerable soft tissue irregularity is noted. IMPRESSION: Comminuted distal tibial and fibular fractures. Intra-articular extension of the tibial fracture is noted. Electronically Signed   By: Alcide Clever M.D.   On: 03/11/2019 17:03    Procedures Procedures (including critical care time)  Medications Ordered in ED Medications  chlorhexidine (HIBICLENS) 4 % liquid 4 application (has no administration in time range)  povidone-iodine 10 % swab 2 application (has no administration in time range)  ceFAZolin (ANCEF) IVPB 2g/100 mL premix (has no administration in time range)  enoxaparin (LOVENOX) injection 40 mg ( Subcutaneous Automatically Held 03/20/19 1000)  0.45 % NaCl with KCl 20 mEq / L infusion (has no administration in time range)  methocarbamol (ROBAXIN) tablet 500 mg ( Oral See Alternative 03/11/19 1830)    Or  methocarbamol (ROBAXIN) 500 mg in dextrose 5 % 50 mL IVPB (500 mg Intravenous New Bag/Given 03/11/19 1830)  ondansetron (ZOFRAN) tablet 4 mg ( Oral MAR Hold 03/11/19 1723)    Or  ondansetron (ZOFRAN) injection 4 mg ( Intravenous MAR Hold 03/11/19 1723)  oxyCODONE (Oxy IR/ROXICODONE) immediate release tablet 5-15 mg ( Oral MAR Hold 03/11/19 1723)  morphine 2 MG/ML injection 2 mg ( Intravenous MAR Hold 03/11/19 1723)  midazolam (VERSED) 2 MG/2ML injection (has no administration in time range)  fentaNYL (SUBLIMAZE) 100 MCG/2ML injection (has no administration in time range)  Tdap (BOOSTRIX) injection 0.5 mL (0.5 mLs Intramuscular Given 03/11/19 1621)  ceFAZolin (ANCEF) IVPB 2g/100 mL premix (0 g Intravenous Stopped 03/11/19 1653)  morphine 4 MG/ML injection 6 mg (6 mg Intravenous Given 03/11/19 1615)  morphine 4 MG/ML  injection 6 mg (6 mg Intravenous Given 03/11/19  1708)     Initial Impression / Assessment and Plan / ED Course  I have reviewed the triage vital signs and the nursing notes.  Pertinent labs & imaging results that were available during my care of the patient were reviewed by me and considered in my medical decision making (see chart for details).  Clinical Course as of Mar 10 1904  Wed Mar 11, 2019  [id number] 58 year old male had a mechanical fall off a ladder breaking his fall on his right side of his body.  He is a closed right distal radius and ulnar fracture and an open right ankle fracture.  He is intact distal neurovascularly.  Orthopedics has been consulted and they plan on taking him to the operating room for a washout and fixation of his fractures.  Antibiotics tetanus ordered.  Pain control preop labs.   [MB]    Clinical Course User Index [MB] Terrilee FilesButler, Michael C, MD    Patient had a mechanical fall off ladder while painting his house.  He estimates it was 5 feet.  He landed on his right side in an effort to break his fall.  Patient with closed distal radius and ulnar fracture as well as right open ankle fracture.  Tetanus was up-to-date and patient given 2 g Ancef IV given his open ankle fracture.  Will obtain preop labs including CBC and CMP and EKG.  Will x-ray right ankle and tib fib, right wrist and forearm.  Patient given morphine for pain.  My attending Dr. Charm BargesButler discussed case with orthopedics who states they plan to take him to the OR today.  The patient was discussed with and seen by Dr. Charm BargesButler who agrees with the treatment plan.  I viewed all xrays personally. Right wrist and forearm xray shows distal radial and ulnar fractures. Right ankle and tibia fibula xrays shows comminuted distal tibial and fibular fractures.The patient was discussed with and seen by Dr. Charm BargesButler who agrees with the treatment plan.    This note was prepared with assistance of [employer] voice recognition  software. Occasional wrong-word or sound-a-like substitutions may have occurred due to the inherent limitations of voice recognition software.  Final Clinical Impressions(s) / ED Diagnoses   Final diagnoses:  Fall, initial encounter  Type I or II open fracture of right ankle, initial encounter  Right wrist fracture, closed, initial encounter    ED Discharge Orders    None       Kathyrn Lasslbrizze, Kaitlyn E, PA-C 03/11/19 1906    Terrilee FilesButler, Michael C, MD 03/12/19 [phone]

## 2019-03-11 NOTE — ED Triage Notes (Signed)
Per [facility] pt coming from home after a witnessed fall from ladder, approx 7 foot fall onto concrete floor. Open fracture to the right ankle, closed deformity to right wrist. Given 200 mcg fentanyl by EMS. Patient denies LOC, no head neck or back pain. Patient A&0 x4.

## 2019-03-11 NOTE — Progress Notes (Signed)
Orthopedic Tech Progress Note Patient Details:  Jonathan Bass [birth year] [id number]  Ortho Devices Type of Ortho Device: Long leg splint Ortho Device/Splint Location: Left Leg  Ortho Device/Splint Interventions: Ordered, Application, Adjustment   Post Interventions Patient Tolerated: Well Instructions Provided: Adjustment of device, Care of device   [name] [name] 03/11/2019, 5:15 PM

## 2019-03-11 NOTE — Op Note (Signed)
PREOPERATIVE DIAGNOSIS: Right wrist intra-articular distal radius fracture 3 more fragments  POSTOPERATIVE DIAGNOSIS: Same  ATTENDING SURGEON: Dr. Bradly Bienenstock who scrubbed and present for the entire procedure  ASSISTANT SURGEON: None  ANESTHESIA: General via endotracheal tube  OPERATIVE PROCEDURE: #1: Open treatment of right wrist intra-articular distal radius fracture 3 more fragments #2: Right wrist brachial radialis tendon release, tendon tenotomy #3: Radiographs 3 views right wrist  IMPLANTS: Synthes distal radius plate with a combination of distal locking pegs and screws  RADIOGRAPHIC INTERPRETATION: AP lateral oblique views of the wrist do show the volar plate fixation with good alignment of the radial carpal radial height inclination and tilt.  The patient does have the ulnar styloid fracture  SURGICAL INDICATIONS: Patient is a right-hand-dominant gentleman who fell off a ladder sustaining a highly comminuted open fracture dislocation of his ankle as well as right wrist intra-articular distal radius fracture.  In order to help with the management of both of these fractures I offered my services to help manage the complex intra-articular distal radius fracture.  Dr. Jena Gauss is working at the same time and on his distal tibia fracture.  I was consulted as the patient was going to sleep under general anesthesia.  SURGICAL TECHNIQUE: Patient was palpated find the preoperative holding area marked with a permanent marker made on the right wrist indicate the correct operative site.  Preoperative antibiotics were given.  A well-padded tourniquet was then placed on the right brachium and stay with the appropriate drape.  The right upper extremities then prepped and draped normal sterile fashion.  A timeout was called the correct site was identified the procedure then begun.  Attention was then turned to the right wrist.  The limb was then elevated and the tourniquet insufflated.  A longitudinal  incision made directly with the flexor carpi radialis sheath.  Going through the floor the FCR sheath the pronator quadratus with little remained of the pronator quadratus was then elevated off the distal radius.  The patient did have a large radial styloid fragment in order to reduce the radial styloid fragment brachial radialis tendon release and tendon tenotomy was then carried out.  This was a highly comminuted intra-articular distal radius fracture 3 more fragments.  After visualization of the fracture fragments open reduction was then done and held in place with a K wire distally.  The Synthes volar plate was then applied reduction clamps then reduced the distal segment to the proximal segment and the plate was then held distally with a K wire.  Plate confirmation did show good alignment.  The plate height was then adjusted.  Following this distal PEG fixation was then carried out.  Once this was carried out we moved from an ulnar to radial direction.  The radial column used locking screws.  Shaft fixation was then completed proximally.  There was 1 transverse screw that was placed a 2.4 millimeter screw placed from a radial ulnar direction to gain additional fixation of the radial styloid fragment.  Wound was then thoroughly irrigated.  Final radiographs were then obtained.  Stress radiography was done confirming placement of the pegs beneath the articular surface.  Stress radiography also confirmed lack of instability the distal radial no joint under stress radiographs radiographs and examination.  The patient did have a large ulnar styloid fracture but without instability.  Did not appear to be any intercarpal widening as well.  After the wound was irrigated what little remainder the pronator quadratus was then closed with 2-0 Vicryl.  The subcutaneous tissues closed with 4-0 Vicryl and the skin closed with simple 4-0 Prolene.  Adaptic dressing and a sterile compressive bandage then applied.  The patient  was then placed in a well-padded sugar tong splint extubated taken recovery room in good condition.  POSTOPERATIVE PLAN: Patient be admitted to the orthopedic trauma service.  Nonweightbearing in the left right upper extremity the right wrist.  Active range of motion of his fingers.  Edema control for the hand.  He will need to follow-up with me in the office in 2 weeks for repeat radiographs out of the splint likely transition of a short arm cast for a total of 3 weeks additional immobilization.  Therapy around the 5-week mark.  Radiographs at each visit.

## 2019-03-11 NOTE — Op Note (Signed)
Orthopaedic Surgery Operative Note (CSN: [id number] ) Date of Surgery: 03/11/2019  Admit Date: 03/11/2019   Diagnoses: Pre-Op Diagnoses: Type IIIA right open pilon fracture Right intra-articular distal radius fracture  Post-Op Diagnosis: Same  Procedures: 1. CPT 11012-Irrigation and debridement of right open pilon fracture 2. CPT 20692-External fixation of right pilon fracture 3. CPT 27825-Closed reduction of right pilon fracture 4. CPT 97605-Incisional wound vac placement 5. CPT 12005-Closure of open fracture wound >12.5cm  Surgeons : Primary: [name], MD  Assistant: Ulyses Southward, PA-C  Location:OR 5   Anesthesia:General   Antibiotics: Ancef 2g preop   Tourniquet time:None for leg   Estimated Blood Loss:250 mL  Complications:None  Specimens:None   Implants: Synthes large and medium external fixator with 5.57mm threaded half pins in tibia, 6.27mm pin in calcaneus, and 4.52mm pin in 1st and 5th metatarsal.  Indications for Surgery: 58 year old male who fell off a ladder sustaining a type III a open right pilon fracture and a right intra-articular distal radius fracture.  Due to the significant comminution and energy of the fracture and the large open wound I felt that proceeding with urgent I&D and external fixation of the ankle was most appropriate.  Risks and benefits were discussed with the patient.  These risks included but not limited to bleeding, infection, malunion, nonunion, posttraumatic arthritis, need for soft tissue coverage including skin graft and skin flap, stiffness, DVT and even the possibility amputation.  He agreed to proceed with surgery and consent was obtained.  At the same time I reviewed his case with Dr. Orlan Leavens and he agreed to proceed with open reduction internal fixation of his right distal radius fracture concurrently with his right ankle.  Operative Findings: 1.  Type III a open pilon fracture with severe comminution and intra-articular  involvement with about 12 cm medial laceration and another independent 2 cm traumatic laceration over the medial side. 2.  Irrigation and debridement of right type III a open pilon fracture with removal of a small amount of metaphyseal bone. 3.  Closed reduction and external fixation of right pilon fracture using Synthes large medial and external fixation. 4.  Closure of right lower extremity fracture wound with placement of incisional wound VAC.  Procedure: The patient was identified in the preoperative holding area. Consent was confirmed with the patient and their family and all questions were answered. The operative extremity was marked after confirmation with the patient and there were then brought back to the operating room by our anesthesia colleagues. They were placed under general anesthesia and carefully transferred over to a radiolucent flat top table. Here the operative extremity had a bump placed under the hip and the contralateral leg was secured down. The splint was taken down which exposed the ankle and the open fracture wounds there was a large medial transverse laceration that was approximately 12 cm in length.  There was also an anterior medial 2 cm laceration over the anterior border of the medial malleolus.  The leg was then prepped and draped in usual sterile fashion.  The right upper extremity was then prepped and draped for Dr. Bari Edward procedure.  Please see his procedure note for full details regarding the distal radius.  A timeout was performed to verify the patient, the procedure and the extremity. Preoperative antibiotics were dosed.  I started out by debriding the open wound. Starting with the skin, I excised traumatized skin edges with a scalpel. The subcutaneous fat and fascia was excised with a scissors. The bone was  debrided with curette rongeur. Low pressure pulsatile lavage was used to irrigate the wound. A total of 9 L of normal saline was used to the irrigate the wound.  The surgeon's and assistant's gloves were changed and the wound was explored. The posterior tibialis, FDL and neurovascular bundle were all intact.   At this juncture I turned my attention to placing the external fixator. Using a pin clamp as a guide I made two percutaneous incisions and predrilled the tibia and placed 5.64mm threaded half pins, gaining excellent purchase. A lateral fluoroscopic view was obtained to confirm adequate length through the far cortex. A small incision was made over the medial calcaneus a 6.73mm calcaneal transfixation pin was placed through the heel and lateral fluoro was used to confirm placement. Fluoroscopy was then used to visualize the base of the 1st and 5th metatarsals and a percutaneous incision was made over each. A 2.42mm drill bit was used to predrill both cortices of each bone and a 4.15mm pin was placed. The pin clamps and bars were set up and I was able to reduce the joint relatively well on the lateral view and on the AP as well.  The external fixator was tightened.  I then placed 1 g of vancomycin powder and 1.2 g tobramycin powder into the lacerations.  Performed a layer closure of 2-0 Monocryl and 3-0 nylon.  Incisional wound VAC was then placed over the traumatic lacerations.  Curlex was wrapped around the pin sites.  Sterile with web roll and an Ace wrap was loosely placed over the ankle.  Once Dr. Orlan Leavens was finished with this procedure the patient was then awoken from anesthesia and taken to the PACU in stable condition.  Post Op Plan/Instructions: The patient will be nonweightbearing to the right lower extremity.  He will be weightbearing as tolerated through the elbow on the right upper extremity but nonweightbearing through the wrist.  We will obtain a CT scan of his right ankle for surgical planning.  He will receive at least 48 hours of ceftriaxone for his type III open fracture.  We will plan to change his dressing postoperative day 3.  We will plan to  start him on Lovenox postoperative day 1.  I was present and performed the entire surgery.  Ulyses Southward, PA-C did assist me throughout the case. An assistant was necessary given the difficulty in approach, maintenance of reduction and ability to instrument the fracture.   Truitt Merle, MD [facility]

## 2019-03-11 NOTE — Anesthesia Preprocedure Evaluation (Addendum)
Anesthesia Evaluation  Patient identified by MRN, date of birth, ID band Patient awake    Reviewed: Allergy & Precautions, NPO status , Patient's Chart, lab work & pertinent test results  Airway Mallampati: II   Neck ROM: Full    Dental  (+) Teeth Intact   Pulmonary neg pulmonary ROS,    breath sounds clear to auscultation       Cardiovascular negative cardio ROS   Rhythm:Regular     Neuro/Psych PSYCHIATRIC DISORDERS Anxiety negative neurological ROS     GI/Hepatic negative GI ROS, Neg liver ROS,   Endo/Other  negative endocrine ROS  Renal/GU negative Renal ROS     Musculoskeletal Right wrist fx, open right ankle fx   Abdominal   Peds  Hematology negative hematology ROS (+)   Anesthesia Other Findings   Reproductive/Obstetrics                             Anesthesia Physical Anesthesia Plan  ASA: II and emergent  Anesthesia Plan: General   Post-op Pain Management:    Induction: Intravenous and Rapid sequence  PONV Risk Score and Plan: Dexamethasone and Ondansetron  Airway Management Planned: Oral ETT  Additional Equipment: None  Intra-op Plan:   Post-operative Plan: Extubation in OR  Informed Consent: I have reviewed the patients History and Physical, chart, labs and discussed the procedure including the risks, benefits and alternatives for the proposed anesthesia with the patient or authorized representative who has indicated his/her understanding and acceptance.     Dental advisory given  Plan Discussed with: CRNA and Surgeon  Anesthesia Plan Comments:        Anesthesia Quick Evaluation

## 2019-03-11 NOTE — H&P (Signed)
Please see consult note for full H&P

## 2019-03-11 NOTE — Anesthesia Procedure Notes (Signed)
Procedure Name: Intubation Date/Time: 03/11/2019 6:18 PM Performed by: Modena Morrow, CRNA Pre-anesthesia Checklist: Patient identified, Emergency Drugs available, Suction available, Patient being monitored and Timeout performed Patient Re-evaluated:Patient Re-evaluated prior to induction Oxygen Delivery Method: Circle system utilized Preoxygenation: Pre-oxygenation with 100% oxygen Induction Type: IV induction Laryngoscope Size: Miller and 2 Grade View: Grade I Tube type: Oral Tube size: 7.5 mm Number of attempts: 1 Airway Equipment and Method: Stylet Placement Confirmation: ETT inserted through vocal cords under direct vision,  positive ETCO2 and breath sounds checked- equal and bilateral Secured at: 23 cm Tube secured with: Tape Dental Injury: Teeth and Oropharynx as per pre-operative assessment

## 2019-03-11 NOTE — ED Notes (Signed)
Obtained consent for procedure 

## 2019-03-11 NOTE — Consult Note (Signed)
Reason for Consult:Polytrauma Referring Physician: Rueger Weidel Jonathan Bass is an 58 y.o. male.  HPI: Jonathan Bass was about 55ft up a ladder painting when he lost his footing and fell onto concrete. He landed on his right leg then put out his right hand to brace his fall. He had immediate pain in both and the ankle was open. He was brought to the ED where x-rays showed fxs in both and orthopedic surgery was consulted. He is RHD and retired.  Past Medical History:  Diagnosis Date  . Anxiety   . Pulmonary nodule 10/2014   Noted on CT scan -> stable and follow-up 2019.  No further evaluation necessary.    Past Surgical History:  Procedure Laterality Date  . FOOT SURGERY Bilateral   . HERNIA REPAIR  2009  . TONSILLECTOMY      Family History  Problem Relation Age of Onset  . Prostate cancer Father   . Valvular heart disease Father        Had a heart valve replaced (unsure of details)  . Breast cancer Mother   . Parkinson's disease Mother   . Healthy Sister   . Healthy Brother   . Parkinson's disease Maternal Grandmother   . Diabetes Maternal Grandfather     Social History:  reports that he has never smoked. He has never used smokeless tobacco. He reports that he does not drink alcohol or use drugs.  Allergies: No Known Allergies  Medications: I have reviewed the patient's current medications.  No results found for this or any previous visit (from the past 48 hour(s)).  No results found.  Review of Systems  Constitutional: Negative for weight loss.  HENT: Negative for ear discharge, ear pain, hearing loss and tinnitus.   Eyes: Negative for blurred vision, double vision, photophobia and pain.  Respiratory: Negative for cough, sputum production and shortness of breath.   Cardiovascular: Negative for chest pain.  Gastrointestinal: Negative for abdominal pain, nausea and vomiting.  Genitourinary: Negative for dysuria, flank pain, frequency and urgency.  Musculoskeletal:  Positive for joint pain (Right wrist, ankle). Negative for back pain, falls, myalgias and neck pain.  Neurological: Negative for dizziness, tingling, sensory change, focal weakness, loss of consciousness and headaches.  Endo/Heme/Allergies: Does not bruise/bleed easily.  Psychiatric/Behavioral: Negative for depression, memory loss and substance abuse. The patient is not nervous/anxious.    Blood pressure (!) 155/94, pulse 63, temperature (!) 97.5 F (36.4 C), temperature source Oral, resp. rate 12, height 5\' 11"  (1.803 m), weight 90.7 kg, SpO2 96 %. Physical Exam  Constitutional: He appears well-developed and well-nourished. No distress.  HENT:  Head: Normocephalic and atraumatic.  Eyes: Conjunctivae are normal. Right eye exhibits no discharge. Left eye exhibits no discharge. No scleral icterus.  Neck: Normal range of motion.  Cardiovascular: Normal rate and regular rhythm.  Respiratory: Effort normal. No respiratory distress.  Musculoskeletal:     Comments: Right shoulder, elbow, wrist, digits- no skin wounds, wrist deformed, swollen, mod TTP, no instability, no blocks to motion  Sens  Ax/R/M/U intact  Mot   Ax/ R/ PIN/ M/ AIN/ U intact  Rad 2+  RLE Open fx with protruding bone medial ankle, no ecchymosis or rash  No knee effusion  Knee stable to varus/ valgus and anterior/posterior stress  Sens DPN, SPN, TN intact  Motor EHL, ext, flex, evers 5/5  DP 2+, PT 2+  Neurological: He is alert.  Skin: Skin is warm and dry. He is not diaphoretic.  Psychiatric: He  has a normal mood and affect. His behavior is normal.    Assessment/Plan: Fall Right wrist fx -- Plan for ORIF vs CR Open right ankle fx -- I&D, ex fix vs ORIF by Dr. Jena Gauss tonight Anxiety d/o    Freeman Caldron, PA-C Orthopedic Surgery [phone] 03/11/2019, 4:04 PM

## 2019-03-11 NOTE — ED Notes (Signed)
Report called to Short stay RN

## 2019-03-11 NOTE — ED Notes (Signed)
Cell phone and wallet taken to security

## 2019-03-11 NOTE — Consult Note (Signed)
I was consulted for the management of the patient's right upper extremity comminuted intra-articular distal radius fracture.  Dr. Jena Gauss was managing the distal tibia fracture and given the complexity of the distal radius fracture I offered my services to help manage the complex distal radius fracture.  The patient's chart was reviewed his radiographs were reviewed the patient did have a highly common intra-articular distal radius fracture 3 more fragments.

## 2019-03-11 NOTE — Transfer of Care (Signed)
Immediate Anesthesia Transfer of Care Note  Patient: Jonathan Bass  Procedure(s) Performed: IRRIGATION AND DEBRIDEMENT EXTREMITY (Right Leg Lower) EXTERNAL FIXATION LEG (Right Leg Lower) OPEN REDUCTION INTERNAL FIXATION (ORIF) ANKLE FRACTURE (Right Leg Lower) OPEN REDUCTION INTERNAL FIXATION (ORIF) WRIST FRACTURE (Right Arm Lower) CLOSED REDUCTION WRIST (Right Arm Lower)  Patient Location: PACU  Anesthesia Type:General  Level of Consciousness: awake and alert   Airway & Oxygen Therapy: Patient Spontanous Breathing and Patient connected to face mask oxygen  Post-op Assessment: Report given to RN, Post -op Vital signs reviewed and stable and Patient moving all extremities X 4  Post vital signs: Reviewed and stable  Last Vitals:  Vitals Value Taken Time  BP 139/79 03/11/2019  8:48 PM  Temp    Pulse 88 03/11/2019  8:51 PM  Resp 9 03/11/2019  8:51 PM  SpO2 98 % 03/11/2019  8:51 PM  Vitals shown include unvalidated device data.  Last Pain:  Vitals:   03/11/19 1708  TempSrc:   PainSc: 8          Complications: No apparent anesthesia complications

## 2019-03-12 ENCOUNTER — Ambulatory Visit: Payer: 59 | Admitting: Cardiology

## 2019-03-12 LAB — CBC
HCT: 38.1 % — ABNORMAL LOW (ref 39.0–52.0)
Hemoglobin: 12.4 g/dL — ABNORMAL LOW (ref 13.0–17.0)
MCH: 29.7 pg (ref 26.0–34.0)
MCHC: 32.5 g/dL (ref 30.0–36.0)
MCV: 91.4 fL (ref 80.0–100.0)
Platelets: 206 10*3/uL (ref 150–400)
RBC: 4.17 MIL/uL — ABNORMAL LOW (ref 4.22–5.81)
RDW: 12.5 % (ref 11.5–15.5)
WBC: 14 10*3/uL — ABNORMAL HIGH (ref 4.0–10.5)
nRBC: 0 % (ref 0.0–0.2)

## 2019-03-12 LAB — HIV ANTIBODY (ROUTINE TESTING W REFLEX): HIV Screen 4th Generation wRfx: NONREACTIVE

## 2019-03-12 NOTE — ED Provider Notes (Signed)
.  Critical Care Performed by: Terrilee Files, MD Authorized by: Terrilee Files, MD   Critical care provider statement:    Critical care time (minutes):  45   Critical care was necessary to treat or prevent imminent or life-threatening deterioration of the following conditions:  Trauma   Critical care was time spent personally by me on the following activities:  Discussions with consultants, evaluation of patient's response to treatment, examination of patient, ordering and performing treatments and interventions, ordering and review of laboratory studies, ordering and review of radiographic studies, pulse oximetry, re-evaluation of patient's condition, obtaining history from patient or surrogate, review of old charts and development of treatment plan with patient or surrogate   I assumed direction of critical care for this patient from another provider in my specialty: no    time spent with patient evaluating open fracture of leg, closed fracture of wrist, consulting orthopedics and stabilizing wound, splinting, antibiotics and pain control. Reading patient for operating room.    Terrilee Files, MD 03/12/19 [phone]

## 2019-03-12 NOTE — Progress Notes (Signed)
Occupational Therapy Evaluation Patient Details Name: Jonathan Bass DoctorStanley Joseph Gent MRN: [id number] DOB: 1961-09-26 Today's Date: 03/12/2019    History of Present Illness 58 year old male who fell off a ladder sustaining a type III a open right pilon fracture and a right intra-articular distal radius fracture. He had Rt LE closed reduction and external fixation of right pilon fracture using Synthes large medial and external fixation on 03/11/19. And Right intra-articular distal radius fracture s/p ORIF by Dr. Melvyn Novasrtmann on 03/11/19.   Clinical Impression   PTA pt PLOF independent in all ADLs and IADLs. Pt currently requires Min A for functional transfers, Min A LB ADLs and UB ADLs due to limitations and weightbear status. Pt will benefot from continued skilled OT to transition to next venue with increased independence and safety return to home setting. DC to HHOT. OT will continue to follow acutely.     Follow Up Recommendations  Home health OT;Follow surgeon's recommendation for DC plan and follow-up therapies;Supervision - Intermittent    Equipment Recommendations  3 in 1 bedside commode    Recommendations for Other Services       Precautions / Restrictions Precautions Precautions: Fall Precaution Comments: due to NWB status, wound vac in place Rt ankle Required Braces or Orthoses: Splint/Cast(Rt wrist) Splint/Cast: Rt wrist, External fixator Rt ankle Restrictions Weight Bearing Restrictions: Yes RUE Weight Bearing: Weight bear through elbow only RLE Weight Bearing: Non weight bearing      Mobility Bed Mobility Overal bed mobility: Modified Independent             General bed mobility comments: with HOB elevated  Transfers Overall transfer level: Needs assistance Equipment used: Right platform walker Transfers: Sit to/from Stand;Stand Pivot Transfers(X 3 ea from bed, from chair, [facility] Sit to Stand: Min assist         General transfer comment: transfer from bed to  toilet.    Balance Overall balance assessment: Needs assistance   Sitting balance-Leahy Scale: Good     Standing balance support: Bilateral upper extremity supported Standing balance-Leahy Scale: Poor Standing balance comment: needs UE support at this time due to NWB on Rt LE and Rt wrist                           ADL either performed or assessed with clinical judgement   ADL Overall ADL's : Needs assistance/impaired Eating/Feeding: Set up;Sitting   Grooming: Set up;Sitting;Bed level   Upper Body Bathing: Minimal assistance;Sitting Upper Body Bathing Details (indicate cue type and reason): Pt will require assist due to limitation of RUE.      Upper Body Dressing : Minimal assistance;Sitting;Bed level Upper Body Dressing Details (indicate cue type and reason): Pt will require assist due to limitation of RUE.  Lower Body Dressing: Minimal assistance Lower Body Dressing Details (indicate cue type and reason): Pt able to don/doff left sock with L hand however will require assist with R sock due to limitation of RUE. Toilet Transfer: Minimal Dentistassistance Toilet Transfer Details (indicate cue type and reason): Min A for safety. Good sit to stand transition with platform RW.         Functional mobility during ADLs: Minimal assistance General ADL Comments: Min guard to MIn A for functional transfer for safety.     Vision         Perception     Praxis      Pertinent Vitals/Pain Pain Assessment: Faces Faces Pain Scale: Hurts a little bit Pain  Location: Rt wrist and Rt ankle Pain Descriptors / Indicators: Aching;Constant(mild numbness in finger tips) Pain Intervention(s): Monitored during session;Repositioned     Hand Dominance Right   Extremity/Trunk Assessment Upper Extremity Assessment RUE Deficits / Details: Rt wrist ORIF limiting ROM in wrist and elbow, shoulder WFL RUE Sensation: (N/T in Rt fingertips) RUE Coordination: decreased gross motor;decreased  fine motor   Lower Extremity Assessment RLE Deficits / Details: Rt hip and knee WNL ROM and strength, unable to assess Rt ankle due to external fixator RLE: Unable to fully assess due to immobilization RLE Coordination: decreased fine motor;decreased gross motor LLE Deficits / Details: WNL ROM and strength   Cervical / Trunk Assessment Cervical / Trunk Assessment: Normal   Communication Communication Communication: No difficulties   Cognition Arousal/Alertness: Awake/alert Behavior During Therapy: WFL for tasks assessed/performed Overall Cognitive Status: Within Functional Limits for tasks assessed                                 General Comments: agreeable and motivated to work with OT   General Comments  Pt educated of ROM of R digits and pt stated WB status with min A. for reminders    Exercises     Shoulder Instructions      Home Living Family/patient expects to be discharged to:: Private residence Living Arrangements: Spouse/significant other Available Help at Discharge: Family;Available 24 hours/day Type of Home: House Home Access: Stairs to enter [employer] of Steps: 2 Entrance Stairs-Rails: None Home Layout: Two level     Bathroom Shower/Tub: Producer, television/film/video: Standard     Home Equipment: None   Additional Comments: everything is on 1st level he does not have to go up to 2nd level      Prior Functioning/Environment Level of Independence: Independent        Comments: retired        OT Problem List: Impaired balance (sitting and/or standing);Decreased knowledge of precautions;Decreased knowledge of use of DME or AE;Decreased safety awareness;Pain      OT Treatment/Interventions: Self-care/ADL training;Therapeutic exercise;DME and/or AE instruction;Therapeutic activities;Patient/family education;Balance training    OT Goals(Current goals can be found in the care plan section) Acute Rehab OT Goals Patient  Stated Goal: Return home OT Goal Formulation: With patient Time For Goal Achievement: 03/26/19 Potential to Achieve Goals: Good  OT Frequency: Min 2X/week   Barriers to D/C:            Co-evaluation              AM-PAC OT "6 Clicks" Daily Activity     Outcome Measure Help from another person eating meals?: A Little Help from another person taking care of personal grooming?: A Little Help from another person toileting, which includes using toliet, bedpan, or urinal?: A Little Help from another person bathing (including washing, rinsing, drying)?: A Little Help from another person to put on and taking off regular upper body clothing?: A Little Help from another person to put on and taking off regular lower body clothing?: A Little 6 Click Score: 18   End of Session Equipment Utilized During Treatment: Gait belt;Rolling walker Nurse Communication: Mobility status;Weight bearing status  Activity Tolerance: Patient tolerated treatment well Patient left: Other (comment)(pt left in bathroom for toileting with RN and NT informed)  OT Visit Diagnosis: Unsteadiness on feet (R26.81);History of falling (Z91.81);Pain Pain - Right/Left: Right Pain - part of body: Leg;Arm  Time: 1424-1440 OT Time Calculation (min): 16 min Charges:  OT General Charges $OT Visit: 1 Visit OT Evaluation $OT Eval Low Complexity: 1 Low  [place] Old, MSOT, OTR/L  [facility]  [phone]  Zigmund Daniel 03/12/2019, 3:34 PM

## 2019-03-12 NOTE — Progress Notes (Addendum)
Orthopaedic Trauma Progress Note  S: Doing well this morning, pain has been well controlled overnight. Discussed the procedures performed yesterday and tentative plan moving forward for definitive fixation of the right ankle. He is very thankful for his care thus far. Plans to work with therapy today  O:  Vitals:   03/11/19 2300 03/12/19 0415  BP: 131/84 122/81  Pulse: 89 79  Resp: 15 16  Temp: 97.6 F (36.4 C) 98.2 F (36.8 C)  SpO2: 96% 97%   General - Laying in bed comfortably. NAD. Pleasant and cooperative Cardiac - Heart regular rate and rhythm Respiratory - No increased work of breathing. Lungs CTA anterior lung fields bilaterally RUE - Splint in place. Moderate swelling in all digits. Arm elevated for swelling control. Able to wiggle fingers. Sensation intact to fingers and upper arm. Brisk cap refill. RLE - External fixator in place with ace wrap to ankle. No drainage from pin sites noted. Sensation intact to light touch of dorsal and plantar aspect of foot. Able to wiggle toes. Neurovascularly intact.  Imaging: Stable post op imaging  Labs:  Results for orders placed or performed during the hospital encounter of 03/11/19 (from the past 24 hour(s))  CBC with Differential     Status: None   Collection Time: 03/11/19  4:00 PM  Result Value Ref Range   WBC 10.2 4.0 - 10.5 K/uL   RBC 4.85 4.22 - 5.81 MIL/uL   Hemoglobin 15.0 13.0 - 17.0 g/dL   HCT 75.4 49.2 - 01.0 %   MCV 92.6 80.0 - 100.0 fL   MCH 30.9 26.0 - 34.0 pg   MCHC 33.4 30.0 - 36.0 g/dL   RDW 07.1 21.9 - 75.8 %   Platelets 191 150 - 400 K/uL   nRBC 0.0 0.0 - 0.2 %   Neutrophils Relative % 75 %   Neutro Abs 7.7 1.7 - 7.7 K/uL   Lymphocytes Relative 18 %   Lymphs Abs 1.8 0.7 - 4.0 K/uL   Monocytes Relative 5 %   Monocytes Absolute 0.5 0.1 - 1.0 K/uL   Eosinophils Relative 1 %   Eosinophils Absolute 0.1 0.0 - 0.5 K/uL   Basophils Relative 0 %   Basophils Absolute 0.0 0.0 - 0.1 K/uL   Immature Granulocytes 1  %   Abs Immature Granulocytes 0.06 0.00 - 0.07 K/uL  Comprehensive metabolic panel     Status: Abnormal   Collection Time: 03/11/19  4:00 PM  Result Value Ref Range   Sodium 137 135 - 145 mmol/L   Potassium 4.0 3.5 - 5.1 mmol/L   Chloride 107 98 - 111 mmol/L   CO2 23 22 - 32 mmol/L   Glucose, Bld 103 (H) 70 - 99 mg/dL   BUN 15 6 - 20 mg/dL   Creatinine, Ser 8.32 0.61 - 1.24 mg/dL   Calcium 9.3 8.9 - 54.9 mg/dL   Total Protein 6.4 (L) 6.5 - 8.1 g/dL   Albumin 4.0 3.5 - 5.0 g/dL   AST 27 15 - 41 U/L   ALT 22 0 - 44 U/L   Alkaline Phosphatase 65 38 - 126 U/L   Total Bilirubin 0.5 0.3 - 1.2 mg/dL   GFR calc non Af Amer >60 >60 mL/min   GFR calc Af Amer >60 >60 mL/min   Anion gap 7 5 - 15  CBC     Status: Abnormal   Collection Time: 03/12/19  3:40 AM  Result Value Ref Range   WBC 14.0 (H) 4.0 - 10.5  K/uL   RBC 4.17 (L) 4.22 - 5.81 MIL/uL   Hemoglobin 12.4 (L) 13.0 - 17.0 g/dL   HCT 14.4 (L) 81.8 - 56.3 %   MCV 91.4 80.0 - 100.0 fL   MCH 29.7 26.0 - 34.0 pg   MCHC 32.5 30.0 - 36.0 g/dL   RDW 14.9 70.2 - 63.7 %   Platelets 206 150 - 400 K/uL   nRBC 0.0 0.0 - 0.2 %    Assessment: 58 year old male s/p fall from 5 foot ladder  Injuries: 1. Type IIIA right open pilon fracture s/p I&D and placement of external fixator ad incisional wound vac on 03/11/19 2. Right intra-articular distal radius fracture s/p ORIF by Dr. Melvyn Novas on 03/11/19  Weightbearing: NWB RLE, NWB right wrist, WBAT through right elbow  Insicional and dressing care: Dressings are clean, dry, intact. Change pin site dressings as needed.  Orthopedic device(s): RUE splint, RLE external fixator  CV/Blood loss: Acute blood loss anemia. Hgb 12.4 this morning, hemodynamically stable  Pain management: 1. Tylenol 650 mg q 6 hours scheduled 2. Neurontin 300 mg TID 3. Oxycodone 5-15 mg q 4 hours PRN 4. Robaxin 500 mg q 6 hours PRN 5. Morphine 2mg  q 2 hours  PRN   VTE prophylaxis: Lovenox 40 mg starting today  ID:  Ceftriaxone 2g x 48 hours  Foley/Lines: No foley, KVO IVFs  Medical co-morbidities: Anxiety, GERD  Impediments to Fracture Healing: Polytrauma  Dispo: Will continue to evaluate swelling and will tentatively plan for definitve fixation early next week   Follow - up plan: TBD   Leng Montesdeoca A. Ladonna Snide [facility] ?[phone]? (phone)

## 2019-03-12 NOTE — Plan of Care (Signed)
  Problem: Activity: Goal: Ability to avoid complications of mobility impairment will improve Outcome: Progressing Goal: Ability to tolerate increased activity will improve Outcome: Progressing   Problem: Education: Goal: Verbalization of understanding the information provided will improve Outcome: Progressing   Problem: Physical Regulation: Goal: Postoperative complications will be avoided or minimized Outcome: Progressing   Problem: Pain Management: Goal: Pain level will decrease Outcome: Progressing   Problem: Skin Integrity: Goal: Signs of wound healing will improve Outcome: Progressing   

## 2019-03-12 NOTE — Evaluation (Signed)
Physical Therapy Evaluation Patient Details Name: Jonathan Bass MRN: [id number] DOB: [birth year] Today's Date: 03/12/2019   History of Present Illness  58 year old male who fell off a ladder sustaining a type III a open right pilon fracture and a right intra-articular distal radius fracture. He had Rt LE closed reduction and external fixation of right pilon fracture using Synthes large medial and external fixation on 03/11/19. And Right intra-articular distal radius fracture s/p ORIF by Jonathan Bass on 03/11/19.    Clinical Impression  Jonathan Bass presents with decreased mobility, decreased strength, decreased ROM, difficulty and unsteadiness walking, and increased pain following Rt wrist fracture and Rt ankle fracture. He is NWB for both of these but is allowed to [facility] on his Rt elbow. He was able to to use Rt platform RW today for short ambulation and transfers with overall min A +2 to manage lines and leads. He was able to transfer into bathroom to have successful bowel/bladder movement today. He will benefit from skilled PT to address his deficits and PT will follow acutely. PT anticipates discharge home with supervision and HHPT.    Follow Up Recommendations Home health PT;Supervision/Assistance - 24 hour;Supervision for mobility/OOB    Equipment Recommendations  Other (comment);3in1 (PT)(Platform Rolling Walker for Rt side)    Recommendations for Other Services       Precautions / Restrictions Precautions Precautions: Fall Precaution Comments: due to NWB status, wound vac in place Rt ankle Required Braces or Orthoses: Splint/Cast(Rt wrist) Splint/Cast: Rt wrist, External fixator Rt ankle Restrictions Weight Bearing Restrictions: Yes RUE Weight Bearing: Weight bear through elbow only RLE Weight Bearing: Non weight bearing      Mobility  Bed Mobility Overal bed mobility: Modified Independent             General bed mobility comments: with HOB  elevated  Transfers Overall transfer level: Needs assistance Equipment used: Right platform walker Transfers: Sit to/from Stand;Stand Pivot Transfers(X 3 ea from bed, from chair, from [facility]) Sit to Stand: Min assist;+2 safety/equipment Stand pivot transfers: Min assist;+2 safety/equipment       General transfer comment: +2 to manage lines/leads  Ambulation/Gait Ambulation/Gait assistance: Min assist;+2 safety/equipment Gait Distance (Feet): 10 Feet(X3 with seated rest breaks) Assistive device: Right platform walker Gait Pattern/deviations: (NWB on Rt LE so hop to pattern Lt leg)   Gait velocity interpretation: <1.31 ft/sec, indicative of household ambulator    Stairs Stairs: (to be assessed before DC, he has 2 steps to enter, no HR)                 Balance Overall balance assessment: Needs assistance   Sitting balance-Leahy Scale: Good     Standing balance support: Bilateral upper extremity supported Standing balance-Leahy Scale: Poor Standing balance comment: needs UE support at this time due to NWB on Rt LE and Rt wrist                             Pertinent Vitals/Pain Pain Assessment: 0-10 Pain Score: 5  Pain Location: Rt wrist and Rt ankle Pain Descriptors / Indicators: Aching;Constant(mild numbness in finger tips) Pain Intervention(s): Limited activity within patient's tolerance;Monitored during session;Repositioned    Home Living Family/patient expects to be discharged to:: Private residence Living Arrangements: Spouse/significant other Available Help at Discharge: Family;Available 24 hours/day Type of Home: House Home Access: Stairs to enter Entrance Stairs-Rails: None Entrance Stairs-Number of Steps: 2 Home Layout: Two level Home Equipment: None Additional Comments:  everything is on 1st level he does not have to go up to 2nd level    Prior Function Level of Independence: Independent         Comments: retired     Higher education careers adviser    Dominant Hand: Right    Extremity/Trunk Assessment   Upper Extremity Assessment Upper Extremity Assessment: RUE deficits/detail RUE Deficits / Details: Rt wrist ORIF limiting ROM in wrist and elbow, shoulder WFL RUE: Unable to fully assess due to immobilization RUE Sensation: (N/T in Rt fingertips) RUE Coordination: decreased gross motor;decreased fine motor    Lower Extremity Assessment Lower Extremity Assessment: RLE deficits/detail RLE Deficits / Details: Rt hip and knee WNL ROM and strength, unable to assess Rt ankle due to external fixator RLE: Unable to fully assess due to immobilization RLE Coordination: decreased fine motor;decreased gross motor LLE Deficits / Details: WNL ROM and strength    Cervical / Trunk Assessment Cervical / Trunk Assessment: Normal  Communication   Communication: No difficulties  Cognition Arousal/Alertness: Awake/alert Behavior During Therapy: WFL for tasks assessed/performed Overall Cognitive Status: Within Functional Limits for tasks assessed                                 General Comments: agreeable and motivated to work with PT      General Comments General comments (skin integrity, edema, etc.): dressing clean, dry, and intact, edema noted in fingertips and toes on Rt, encouraged pt to ROM hands and feet as able     Assessment/Plan    PT Assessment Patient needs continued PT services  PT Problem List Decreased strength;Decreased range of motion;Decreased balance;Decreased activity tolerance;Decreased mobility;Decreased knowledge of use of DME;Decreased safety awareness;Pain;Decreased coordination       PT Treatment Interventions DME instruction;Gait training;Stair training;Functional mobility training;Therapeutic activities;Therapeutic exercise;Balance training;Neuromuscular re-education;Patient/family education;Modalities    PT Goals (Current goals can be found in the Care Plan section)  Acute Rehab PT  Goals Patient Stated Goal: Return home PT Goal Formulation: With patient Time For Goal Achievement: 03/26/19 Potential to Achieve Goals: Good    Frequency Min 5X/week   Barriers to discharge   no barriers to progress at this time       AM-PAC PT "6 Clicks" Mobility  Outcome Measure Help needed turning from your back to your side while in a flat bed without using bedrails?: A Little Help needed moving from lying on your back to sitting on the side of a flat bed without using bedrails?: A Little Help needed moving to and from a bed to a chair (including a wheelchair)?: A Little Help needed standing up from a chair using your arms (e.g., wheelchair or bedside chair)?: A Little Help needed to walk in hospital room?: A Lot Help needed climbing 3-5 steps with a railing? : A Lot 6 Click Score: 16    End of Session Equipment Utilized During Treatment: Gait belt Activity Tolerance: Patient tolerated treatment well Patient left: in chair;with call bell/phone within reach;with chair alarm set;with nursing/sitter in room Nurse Communication: Mobility status;Weight bearing status PT Visit Diagnosis: Unsteadiness on feet (R26.81);Other abnormalities of gait and mobility (R26.89);Muscle weakness (generalized) (M62.81);Pain;Difficulty in walking, not elsewhere classified (R26.2) Pain - Right/Left: Right Pain - part of body: Hand;Ankle and joints of foot    Time: 1657-9038 PT Time Calculation (min) (ACUTE ONLY): 55 min   Charges:   PT Evaluation $PT Eval Moderate Complexity: 1 Mod PT Treatments $Gait Training:  8-22 mins $Therapeutic Activity: 23-37 mins   Ivery Quale, PT,DPT Acute Rehabilitation Services Office [phone]      April Manson, PT,DPT 03/12/2019, 12:24 PM

## 2019-03-12 NOTE — TOC Initial Note (Signed)
Transition of Care [facility]) - Initial/Assessment Note    Patient Details  Name: Jonathan Bass MRN: [id number] Date of Birth: [birth year]  [facility]) CM/SW Contact:    Durenda Guthrie, RN Phone Number: 03/12/2019, 6:27 PM  Clinical Narrative:  58 yr old gentleman admitted with right open pilon fracture s/p I&D and placement of external fixator ad incisional wound vac on 03/11/19 .Undewent ORIF of right distal radius fracture on 03/11/19 also. Case manager spoke with patient concerning discharge plan and DME. Choice for Home Health agency was offered, referral was called to Dara Hoyer, [facility]. Patient lives home with wife and will have support at discharge.                Expected Discharge Plan: Home w Home Health Services Barriers to Discharge: No Barriers Identified   Patient Goals and CMS Choice     Choice offered to / list presented to : Patient  Expected Discharge Plan and Services Expected Discharge Plan: Home w Home Health Services   Discharge Planning Services: CM Consult Post Acute Care Choice: Durable Medical Equipment, Home Health Living arrangements for the past 2 months: Single Family Home                 DME Arranged: 3-N-1, Walker rolling(right arm platform for walker) DME Agency: [facility] HH Arranged: PT HH [facility]  Prior Living Arrangements/Services Living arrangements for the past 2 months: Single Family Home Lives with:: Spouse   Do you feel safe going back to the place where you live?: Yes      Need for Family Participation in Patient Care: Yes (Comment) Care giver support system in place?: Yes (comment)   Criminal Activity/Legal Involvement Pertinent to Current Situation/Hospitalization: No - Comment as needed  Activities of Daily Living Home Assistive Devices/Equipment: None ADL Screening (condition at time of admission) Patient's cognitive ability adequate to  safely complete daily activities?: Yes Is the patient deaf or have difficulty hearing?: No Does the patient have difficulty seeing, even when wearing glasses/contacts?: No Does the patient have difficulty concentrating, remembering, or making decisions?: No Patient able to express need for assistance with ADLs?: No Does the patient have difficulty dressing or bathing?: No Independently performs ADLs?: Yes (appropriate for developmental age) Does the patient have difficulty walking or climbing stairs?: No Weakness of Legs: None Weakness of Arms/Hands: None  Permission Sought/Granted Permission sought to share information with : Case Manager                Emotional Assessment Appearance:: Appears stated age Attitude/Demeanor/Rapport: Gracious, Engaged Affect (typically observed): Accepting, Adaptable Orientation: : Oriented to Self, Oriented to Situation, Oriented to Place, Oriented to  Time Alcohol / Substance Use: Not Applicable    Admission diagnosis:  Type III open pilon fracture of right tibia [S82.871C] Patient Active Problem List   Diagnosis Date Noted  . Open pilon fracture, right, type III, initial encounter 03/11/2019  . Closed fracture of right distal radius 03/11/2019  . Type III open pilon fracture of right tibia 03/11/2019  . Pounding heartbeat 01/21/2019  . Precordial chest pain 03/05/2014   PCP:  Juluis Rainier, MD Pharmacy:   [facility] [phone] - [place], [place] - [address] [place]  [address] Phone: [phone] Fax: [phone]     Social Determinants of Health (SDOH) Interventions    Readmission Risk Interventions No flowsheet data  found.  

## 2019-03-13 MED ORDER — MAGNESIUM CITRATE PO SOLN
1.0000 | Freq: Once | ORAL | Status: AC
  Administered 2019-03-13: 1 via ORAL
  Filled 2019-03-13: qty 296

## 2019-03-13 NOTE — Progress Notes (Signed)
Orthopaedic Trauma Progress Note  S: Doing well this morning, pain has been well controlled. Remains in good spirits and is very thankful for his care thus far. Worked well with therapies yesterday, was able to transfer into bathroom and ambulate short distances with platform walker.   O:  Vitals:   03/12/19 1926 03/13/19 0402  BP: 139/64 129/69  Pulse: 77 72  Resp: 16 16  Temp: 97.9 F (36.6 C) 99 F (37.2 C)  SpO2: 98% 94%   General - Laying in bed comfortably. NAD. Pleasant and cooperative Cardiac - Heart regular rate and rhythm Respiratory - No increased work of breathing. Lungs CTA anterior lung fields bilaterally RUE - Splint in place. Moderate swelling in all digits. Arm elevated for swelling control. Able to wiggle fingers. Sensation intact to fingers and upper arm. Brisk cap refill. RLE - External fixator in place with ace wrap and incisional wound VAC to ankle. No drainage from pin sites noted. Swelling in the foot and ankle. Sensation intact to light touch of dorsal and plantar aspect of foot. Able to wiggle toes. Neurovascularly intact.  Imaging: Stable post op imaging  Labs:  No results found for this or any previous visit (from the past 24 hour(s)).  Assessment: 58 year old male s/p fall from 5 foot ladder  Injuries: 1. Type IIIA right open pilon fracture s/p I&D and placement of external fixator ad incisional wound vac on 03/11/19 2. Right intra-articular distal radius fracture s/p ORIF by Dr. Melvyn Novas on 03/11/19  Weightbearing: NWB RLE, NWB right wrist, WBAT through right elbow  Insicional and dressing care: Dressings are clean, dry, intact. Change pin site dressings as needed.  Orthopedic device(s): RUE splint, RLE external fixator  CV/Blood loss: Acute blood loss anemia. Hgb 12.4 yesterday, hemodynamically stable  Pain management: 1. Tylenol 650 mg q 6 hours scheduled 2. Neurontin 300 mg TID 3. Oxycodone 5-15 mg q 4 hours PRN 4. Robaxin 500 mg q 6 hours PRN 5.  Morphine 2mg  q 2 hours  PRN   VTE prophylaxis: Lovenox 40 mg daily  ID: Ceftriaxone 2g x 48 hours - completed  Foley/Lines: No foley, KVO IVFs  Medical co-morbidities: Anxiety, GERD  Impediments to Fracture Healing: Polytrauma  Dispo: Will continue to evaluate swelling and will tentatively plan for definitve fixation early next week   Follow - up plan: TBD   Jonathan Bass A. Ladonna Snide [facility] ?([phone]? (phone)

## 2019-03-13 NOTE — Progress Notes (Signed)
Right lower leg external fixator intact, pin care done as ordered. Right foot swelling noted. RLE elevated, ice pack applied.

## 2019-03-13 NOTE — Plan of Care (Signed)
  Problem: Pain Management: Goal: Pain level will decrease 03/13/2019 0133 by Quincy Sheehan, RN Outcome: Progressing 03/13/2019 0132 by Quincy Sheehan, RN Outcome: Progressing

## 2019-03-13 NOTE — Progress Notes (Signed)
Physical Therapy Treatment Patient Details Name: Jonathan Bass MRN: [id number] DOB: Jul 21, 1961 Today's Date: 03/13/2019    History of Present Illness 58 year old male who fell off a ladder sustaining a type III a open right pilon fracture and a right intra-articular distal radius fracture. He had Rt LE closed reduction and external fixation of right pilon fracture using Synthes large medial and external fixation on 03/11/19. And Right intra-articular distal radius fracture s/p ORIF by Dr. Melvyn Novas on 03/11/19.    PT Comments    On entry pt reports his pain medicine has just begun to work and that he is eager to get out of bed. Pt currently limited in safe mobility by pain, decreased safety awareness, Ex fix and wound vac on R LE. Pt currently mod I for bed mobility, minA for sit>stand and min guard-minA for ambulation of 200 feet with numerous standing rest breaks. D/c plans remain appropriate. PT will continue to follow acutely.    Follow Up Recommendations  Home health PT;Supervision/Assistance - 24 hour;Supervision for mobility/OOB     Equipment Recommendations  Other (comment);3in1 (PT)(Platform Rolling Walker for Rt side)       Precautions / Restrictions Precautions Precautions: Fall Precaution Comments: due to NWB status, wound vac in place Rt ankle Required Braces or Orthoses: Splint/Cast(Rt wrist) Splint/Cast: Rt wrist, External fixator Rt ankle Restrictions Weight Bearing Restrictions: Yes RUE Weight Bearing: Weight bear through elbow only RLE Weight Bearing: Non weight bearing    Mobility  Bed Mobility Overal bed mobility: Modified Independent             General bed mobility comments: with HOB elevated  Transfers Overall transfer level: Needs assistance Equipment used: Right platform walker Transfers: Sit to/from Stand;Stand Pivot Transfers(X 3 ea from bed, from chair, from [facility]) Sit to Stand: Min assist         General transfer comment: minA for  steadying in standing at [employer]  Ambulation/Gait Ambulation/Gait assistance: Min assist;+2 safety/equipment;Min guard Gait Distance (Feet): 200 Feet Assistive device: Right platform walker Gait Pattern/deviations: Antalgic(NWB on Rt LE so hop to pattern Lt leg) Gait velocity: slowed Gait velocity interpretation: <1.31 ft/sec, indicative of household ambulator General Gait Details: min guard with minA for steadying as pt fatigues and has difficulty keeping R foot elevated. PT recommends standing rest break. Pt able to maintain NWB throughout.       Balance Overall balance assessment: Needs assistance   Sitting balance-Leahy Scale: Good     Standing balance support: Bilateral upper extremity supported Standing balance-Leahy Scale: Poor Standing balance comment: needs UE support at this time due to NWB on Rt LE and Rt wrist                            Cognition Arousal/Alertness: Awake/alert Behavior During Therapy: WFL for tasks assessed/performed Overall Cognitive Status: Within Functional Limits for tasks assessed                                 General Comments: agreeable and motivated to work with PT         General Comments General comments (skin integrity, edema, etc.): Pt is very goal oriented and requires cuing to take breaks as he fatigues and jeopordizes R LE NWB.       Pertinent Vitals/Pain Pain Assessment: Faces Faces Pain Scale: Hurts a little bit Pain Location: Rt wrist and Rt ankle  Pain Descriptors / Indicators: Aching;Constant(mild numbness in finger tips) Pain Intervention(s): Limited activity within patient's tolerance;Monitored during session;Premedicated before session;Repositioned           PT Goals (current goals can now be found in the care plan section) Acute Rehab PT Goals Patient Stated Goal: Return home PT Goal Formulation: With patient Time For Goal Achievement: 03/26/19 Potential to Achieve Goals: Good Progress  towards PT goals: Progressing toward goals    Frequency    Min 5X/week      PT Plan Current plan remains appropriate       AM-PAC PT "6 Clicks" Mobility   Outcome Measure  Help needed turning from your back to your side while in a flat bed without using bedrails?: A Little Help needed moving from lying on your back to sitting on the side of a flat bed without using bedrails?: A Little Help needed moving to and from a bed to a chair (including a wheelchair)?: A Little Help needed standing up from a chair using your arms (e.g., wheelchair or bedside chair)?: A Little Help needed to walk in hospital room?: A Lot Help needed climbing 3-5 steps with a railing? : A Lot 6 Click Score: 16    End of Session Equipment Utilized During Treatment: Gait belt Activity Tolerance: Patient tolerated treatment well Patient left: in chair;with call bell/phone within reach;with chair alarm set;with nursing/sitter in room Nurse Communication: Mobility status;Weight bearing status PT Visit Diagnosis: Unsteadiness on feet (R26.81);Other abnormalities of gait and mobility (R26.89);Muscle weakness (generalized) (M62.81);Pain;Difficulty in walking, not elsewhere classified (R26.2) Pain - Right/Left: Right Pain - part of body: Hand;Ankle and joints of foot     Time: 7628-3151 PT Time Calculation (min) (ACUTE ONLY): 30 min  Charges:  $Gait Training: 23-37 mins                     Larayah Clute B. Beverely Risen PT, DPT Acute Rehabilitation Services Pager [phone] Office [phone]    Elon Alas Fleet 03/13/2019, 2:20 PM

## 2019-03-14 ENCOUNTER — Encounter (HOSPITAL_COMMUNITY): Payer: Self-pay | Admitting: Orthopedic Surgery

## 2019-03-14 DIAGNOSIS — F419 Anxiety disorder, unspecified: Secondary | ICD-10-CM | POA: Diagnosis present

## 2019-03-14 MED ORDER — METHOCARBAMOL 500 MG PO TABS: 500.0000 mg | ORAL_TABLET | Freq: Four times a day (QID) | ORAL | 0 refills | Status: DC | PRN

## 2019-03-14 MED ORDER — VITAMIN D3 125 MCG (5000 UT) PO TABS: 1.0000 | ORAL_TABLET | Freq: Every day | ORAL | 2 refills | Status: DC

## 2019-03-14 MED ORDER — ASCORBIC ACID 500 MG PO TABS: 500.0000 mg | ORAL_TABLET | Freq: Every day | ORAL | 0 refills | Status: DC

## 2019-03-14 MED ORDER — OXYCODONE-ACETAMINOPHEN 7.5-325 MG PO TABS: 1.0000 | ORAL_TABLET | Freq: Four times a day (QID) | ORAL | 0 refills | Status: DC | PRN

## 2019-03-14 MED ORDER — ONDANSETRON HCL 4 MG PO TABS: 4.0000 mg | ORAL_TABLET | Freq: Four times a day (QID) | ORAL | 0 refills | Status: DC | PRN

## 2019-03-14 MED ORDER — ENOXAPARIN SODIUM 40 MG/0.4ML ~~LOC~~ SOLN: 40.0000 mg | SUBCUTANEOUS | 0 refills | Status: DC

## 2019-03-14 MED ORDER — ENOXAPARIN (LOVENOX) PATIENT EDUCATION KIT: 1.0000 | PACK | Freq: Once | 0 refills | Status: AC

## 2019-03-14 MED ORDER — GABAPENTIN 300 MG PO CAPS: 300.0000 mg | ORAL_CAPSULE | Freq: Three times a day (TID) | ORAL | 0 refills | Status: DC

## 2019-03-14 NOTE — Discharge Instructions (Signed)
[facility] Discharge Instructions   General Discharge Instructions  WEIGHT BEARING STATUS: Nonweightbearing Right leg and Right wrist. Ok to weight-bear through Right elbow  RANGE OF MOTION/ACTIVITY: activity as tolerated while maintaining weight bearing restrictions.  Aggressive elevation of Right leg to help with swelling control. Elevate leg above heart as much as possible. Wiggle toes as well to help with swelling control   Wound Care: see below.  Do not remove splint from R arm    Discharge Pin Site Instructions  Dress pins daily with Kerlix roll starting on POD 2. Wrap the Kerlix so that it tamps the skin down around the pin-skin interface to prevent/limit motion of the skin relative to the pin.  (Pin-skin motion is the primary cause of pain and infection related to external fixator pin sites).  Remove any crust or coagulum that may obstruct drainage with a saline moistened gauze or soap and water.  After POD 3, if there is no discernable drainage on the pin site dressing, the interval for change can by increased to every other day.  You may shower with the fixator, cleaning all pin sites gently with soap and water.  If you have a surgical wound this needs to be completely dry and without drainage before showering.  The extremity can be lifted by the fixator to facilitate wound care and transfers.  Notify the office/Doctor if you experience increasing drainage, redness, or pain from a pin site, or if you notice purulent (thick, snot-like) drainage.  Discharge Wound Care Instructions  Do NOT apply any ointments, solutions or lotions to pin sites or surgical wounds.  These prevent needed drainage and even though solutions like hydrogen peroxide kill bacteria, they also damage cells lining the pin sites that help fight infection.  Applying lotions or ointments can keep the wounds moist and can cause them to breakdown and open up as well. This can increase the risk  for infection. When in doubt call the office.  Surgical incisions should be dressed daily.  If any drainage is noted, use one layer of adaptic, then gauze, Kerlix, and an ace wrap.  Once the incision is completely dry and without drainage, it may be left open to air out.  Showering may begin 36-48 hours later.  Cleaning gently with soap and water.  Traumatic wounds should be dressed daily as well.    One layer of adaptic, gauze, Kerlix, then ace wrap.  The adaptic can be discontinued once the draining has ceased    If you have a wet to dry dressing: wet the gauze with saline the squeeze as much saline out so the gauze is moist (not soaking wet), place moistened gauze over wound, then place a dry gauze over the moist one, followed by Kerlix wrap, then ace wrap.   DVT/PE prophylaxis: Lovenox injection daily until instructed to stop   Diet: as you were eating previously.  Can use over the counter stool softeners and bowel preparations, such as Miralax, to help with bowel movements.  Narcotics can be constipating.  Be sure to drink plenty of fluids  PAIN MEDICATION USE AND EXPECTATIONS  You have likely been given narcotic medications to help control your pain.  After a traumatic event that results in an fracture (broken bone) with or without surgery, it is ok to use narcotic pain medications to help control one's pain.  We understand that everyone responds to pain differently and each individual patient will be evaluated on a regular basis for the  continued need for narcotic medications. Ideally, narcotic medication use should last no more than 6-8 weeks (coinciding with fracture healing).   As a patient it is your responsibility as well to monitor narcotic medication use and report the amount and frequency you use these medications when you come to your office visit.   We would also advise that if you are using narcotic medications, you should take a dose prior to therapy to maximize you  participation.  IF YOU ARE ON NARCOTIC MEDICATIONS IT IS NOT PERMISSIBLE TO OPERATE A MOTOR VEHICLE (MOTORCYCLE/CAR/TRUCK/MOPED) OR HEAVY MACHINERY DO NOT MIX NARCOTICS WITH OTHER CNS (CENTRAL NERVOUS SYSTEM) DEPRESSANTS SUCH AS ALCOHOL   STOP SMOKING OR USING NICOTINE PRODUCTS!!!!  As discussed nicotine severely impairs your body's ability to heal surgical and traumatic wounds but also impairs bone healing.  Wounds and bone heal by forming microscopic blood vessels (angiogenesis) and nicotine is a vasoconstrictor (essentially, shrinks blood vessels).  Therefore, if vasoconstriction occurs to these microscopic blood vessels they essentially disappear and are unable to deliver necessary nutrients to the healing tissue.  This is one modifiable factor that you can do to dramatically increase your chances of healing your injury.    (This means no smoking, no nicotine gum, patches, etc)  DO NOT USE NONSTEROIDAL ANTI-INFLAMMATORY DRUGS (NSAID'S)  Using products such as Advil (ibuprofen), Aleve (naproxen), Motrin (ibuprofen) for additional pain control during fracture healing can delay and/or prevent the healing response.  If you would like to take over the counter (OTC) medication, Tylenol (acetaminophen) is ok.  However, some narcotic medications that are given for pain control contain acetaminophen as well. Therefore, you should not exceed more than 4000 mg of tylenol in a day if you do not have liver disease.  Also note that there are may OTC medicines, such as cold medicines and allergy medicines that my contain tylenol as well.  If you have any questions about medications and/or interactions please ask your doctor/PA or your pharmacist.      ICE AND ELEVATE INJURED/OPERATIVE EXTREMITY  Using ice and elevating the injured extremity above your heart can help with swelling and pain control.  Icing in a pulsatile fashion, such as 20 minutes on and 20 minutes off, can be followed.    Do not place ice  directly on skin. Make sure there is a barrier between to skin and the ice pack.    Using frozen items such as frozen peas works well as the conform nicely to the are that needs to be iced.  USE AN ACE WRAP OR TED HOSE FOR SWELLING CONTROL  In addition to icing and elevation, Ace wraps or TED hose are used to help limit and resolve swelling.  It is recommended to use Ace wraps or TED hose until you are informed to stop.    When using Ace Wraps start the wrapping distally (farthest away from the body) and wrap proximally (closer to the body)   Example: If you had surgery on your leg or thing and you do not have a splint on, start the ace wrap at the toes and work your way up to the thigh        If you had surgery on your upper extremity and do not have a splint on, start the ace wrap at your fingers and work your way up to the upper arm  IF YOU ARE IN A SPLINT OR CAST DO NOT REMOVE IT FOR ANY REASON   If your splint gets  wet for any reason please contact the office immediately. You may shower in your splint or cast as long as you keep it dry.  This can be done by wrapping in a cast cover or garbage back (or similar)  Do Not stick any thing down your splint or cast such as pencils, money, or hangers to try and scratch yourself with.  If you feel itchy take benadryl as prescribed on the bottle for itching  IF YOU ARE IN A CAM BOOT (BLACK BOOT)  You may remove boot periodically. Perform daily dressing changes as noted below.  Wash the liner of the boot regularly and wear a sock when wearing the boot. It is recommended that you sleep in the boot until told otherwise  CALL THE OFFICE WITH ANY QUESTIONS OR CONCERNS: [phone]

## 2019-03-14 NOTE — Progress Notes (Signed)
No further d/c questions. AVS education given. Pin education given and pin care completed 1230. No complaints of respiratory distress. Pain medication given. PIV removed. D/c via wheelchair with home equipment. Home health set up. No further concerns.

## 2019-03-14 NOTE — Discharge Summary (Signed)
Orthopaedic Trauma Service (OTS) Discharge Summary   Patient ID: Jonathan Bass MRN: [id number] DOB/AGE: 60/07/1961 60 y.o.  Admit date: 03/11/2019 Discharge date: 03/14/2019  Admission Diagnoses: Open right pilon fracture, tibia and fibula Closed right distal radius fracture Anxiety  Discharge Diagnoses:  Principal Problem:   Open pilon fracture, right, type III, initial encounter Active Problems:   Closed fracture of right distal radius   Anxiety   Past Medical History:  Diagnosis Date  . Anxiety   . Pulmonary nodule 10/2014   Noted on CT scan -> stable and follow-up 2019.  No further evaluation necessary.     Procedures Performed:  03/11/2019-Dr. Haddix  1. CPT 11012-Irrigation and debridement of right open pilon fracture 2. CPT 20692-External fixation of right pilon fracture 3. CPT 27825-Closed reduction of right pilon fracture 4. CPT 97605-Incisional wound vac placement 5. CPT 12005-Closure of open fracture wound >12.5cm  03/11/2019-Dr. Caralyn Guile #1: Open treatment of right wrist intra-articular distal radius fracture 3 more fragments #2: Right wrist brachial radialis tendon release, tendon tenotomy #3: Radiographs 3 views right wrist  Discharged Condition: good  Hospital Course:    Patient is a 58 year old white male that was injured on 03/11/2019 after falling off of a ladder.  Patient sustained numerous injuries including an open right distal tibia and fibula fracture and a comminuted right distal radius fracture.  Patient was taken to the operating on the day of presentation of injury for the procedures noted above.  After surgery patient was admitted to the orthopedic trauma service for close monitoring and to work with therapies.  Due to the complex nature of his distal tibia injury he was initially treated with a spanning external fixation.  There was some hope that his swelling would subside enough to allow for early definitive fixation however  his swelling has remained quite significant and we do not anticipate definitive treatment for several more days.  As such patient will be discharged 03/14/2019 to home with home health services and follow-up with orthopedic trauma service on 03/17/2019 to further evaluate his soft tissues to determine if they are ready for surgery  Patient's hospital course was fairly uncomplicated he was covered with antibiotics for open fracture treatment.  He mobilized very well with physical therapy and occupational therapy starting on postoperative day #1.  He was started on Lovenox for DVT and PE prophylaxis as well.  Multimodal analgesia was followed with good control  Patient tolerating regular diet and voiding without difficulty.  Patient deemed stable for discharge on postoperative day #3   Patient's discharge prescriptions were E prescribed to his [facility] listed in epic which is in [facility].  Consults: hand surgery   Significant Diagnostic Studies: labs:   Results for Jonathan Bass, Jonathan "STAN" (MRN [id number]) as of 03/14/2019 09:52  Ref. Range 03/12/2019 03:40  WBC Latest Ref Range: 4.0 - 10.5 K/uL 14.0 (H)  RBC Latest Ref Range: 4.22 - 5.81 MIL/uL 4.17 (L)  Hemoglobin Latest Ref Range: 13.0 - 17.0 g/dL 12.4 (L)  HCT Latest Ref Range: 39.0 - 52.0 % 38.1 (L)  MCV Latest Ref Range: 80.0 - 100.0 fL 91.4  MCH Latest Ref Range: 26.0 - 34.0 pg 29.7  MCHC Latest Ref Range: 30.0 - 36.0 g/dL 32.5  RDW Latest Ref Range: 11.5 - 15.5 % 12.5  Platelets Latest Ref Range: 150 - 400 K/uL 206  nRBC Latest Ref Range: 0.0 - 0.2 % 0.0  HIV Screen 4th Generation wRfx Latest Ref Range:  Non Reactive  Non Reactive    Treatments: IV hydration, antibiotics: ceftriaxone, analgesia: acetaminophen, Dilaudid and oxy IR, anticoagulation: LMW heparin, therapies: PT, OT and RN and surgery: as above  Discharge Exam:  BP 139/73 (BP Location: Left Arm)   Pulse 78   Temp (!) 97.5 F (36.4 C) (Oral)   Resp  16   Ht 5' 11"  (1.803 m)   Wt 90.7 kg   SpO2 100%   BMI 27.89 kg/m   Gen: appears well  Right Lower Extremity with external fixator in place.  Swelling is still too severe No real skin wrinkling to allow for safe surgical intervention earlier next week. Motor and sensory functions are grossly intact + DP pulse   Disposition: Discharge disposition: 01-Home or Self Care       Discharge Instructions    Call MD / Call 911   Complete by:  As directed    If you experience chest pain or shortness of breath, CALL 911 and be transported to the hospital emergency room.  If you develope a fever above 101 F, pus (white drainage) or increased drainage or redness at the wound, or calf pain, call your surgeon's office.   Constipation Prevention   Complete by:  As directed    Drink plenty of fluids.  Prune juice may be helpful.  You may use a stool softener, such as Colace (over the counter) 100 mg twice a day.  Use MiraLax (over the counter) for constipation as needed.   Diet general   Complete by:  As directed    Discharge instructions   Complete by:  As directed    Orthopaedic Trauma Service Discharge Instructions   General Discharge Instructions  WEIGHT BEARING STATUS: Nonweightbearing Right leg and Right wrist. Ok to weight-bear through Right elbow  RANGE OF MOTION/ACTIVITY: activity as tolerated while maintaining weight bearing restrictions.  Aggressive elevation of Right leg to help with swelling control. Elevate leg above heart as much as possible. Wiggle toes as well to help with swelling control   Wound Care: see below.  Do not remove splint from R arm    Discharge Pin Site Instructions  Dress pins daily with Kerlix roll starting on POD 2. Wrap the Kerlix so that it tamps the skin down around the pin-skin interface to prevent/limit motion of the skin relative to the pin.  (Pin-skin motion is the primary cause of pain and infection related to external fixator pin sites).   Remove any crust or coagulum that may obstruct drainage with a saline moistened gauze or soap and water.  After POD 3, if there is no discernable drainage on the pin site dressing, the interval for change can by increased to every other day.  You may shower with the fixator, cleaning all pin sites gently with soap and water.  If you have a surgical wound this needs to be completely dry and without drainage before showering.  The extremity can be lifted by the fixator to facilitate wound care and transfers.  Notify the office/Doctor if you experience increasing drainage, redness, or pain from a pin site, or if you notice purulent (thick, snot-like) drainage.  Discharge Wound Care Instructions  Do NOT apply any ointments, solutions or lotions to pin sites or surgical wounds.  These prevent needed drainage and even though solutions like hydrogen peroxide kill bacteria, they also damage cells lining the pin sites that help fight infection.  Applying lotions or ointments can keep the wounds moist and can cause them  to breakdown and open up as well. This can increase the risk for infection. When in doubt call the office.  Surgical incisions should be dressed daily.  If any drainage is noted, use one layer of adaptic, then gauze, Kerlix, and an ace wrap.  Once the incision is completely dry and without drainage, it may be left open to air out.  Showering may begin 36-48 hours later.  Cleaning gently with soap and water.  Traumatic wounds should be dressed daily as well.    One layer of adaptic, gauze, Kerlix, then ace wrap.  The adaptic can be discontinued once the draining has ceased    If you have a wet to dry dressing: wet the gauze with saline the squeeze as much saline out so the gauze is moist (not soaking wet), place moistened gauze over wound, then place a dry gauze over the moist one, followed by Kerlix wrap, then ace wrap.   DVT/PE prophylaxis: Lovenox injection daily until instructed  to stop   Diet: as you were eating previously.  Can use over the counter stool softeners and bowel preparations, such as Miralax, to help with bowel movements.  Narcotics can be constipating.  Be sure to drink plenty of fluids  PAIN MEDICATION USE AND EXPECTATIONS  You have likely been given narcotic medications to help control your pain.  After a traumatic event that results in an fracture (broken bone) with or without surgery, it is ok to use narcotic pain medications to help control one's pain.  We understand that everyone responds to pain differently and each individual patient will be evaluated on a regular basis for the continued need for narcotic medications. Ideally, narcotic medication use should last no more than 6-8 weeks (coinciding with fracture healing).   As a patient it is your responsibility as well to monitor narcotic medication use and report the amount and frequency you use these medications when you come to your office visit.   We would also advise that if you are using narcotic medications, you should take a dose prior to therapy to maximize you participation.  IF YOU ARE ON NARCOTIC MEDICATIONS IT IS NOT PERMISSIBLE TO OPERATE A MOTOR VEHICLE (MOTORCYCLE/CAR/TRUCK/MOPED) OR HEAVY MACHINERY DO NOT MIX NARCOTICS WITH OTHER CNS (CENTRAL NERVOUS SYSTEM) DEPRESSANTS SUCH AS ALCOHOL   STOP SMOKING OR USING NICOTINE PRODUCTS!!!!  As discussed nicotine severely impairs your body's ability to heal surgical and traumatic wounds but also impairs bone healing.  Wounds and bone heal by forming microscopic blood vessels (angiogenesis) and nicotine is a vasoconstrictor (essentially, shrinks blood vessels).  Therefore, if vasoconstriction occurs to these microscopic blood vessels they essentially disappear and are unable to deliver necessary nutrients to the healing tissue.  This is one modifiable factor that you can do to dramatically increase your chances of healing your injury.    (This  means no smoking, no nicotine gum, patches, etc)  DO NOT USE NONSTEROIDAL ANTI-INFLAMMATORY DRUGS (NSAID'S)  Using products such as Advil (ibuprofen), Aleve (naproxen), Motrin (ibuprofen) for additional pain control during fracture healing can delay and/or prevent the healing response.  If you would like to take over the counter (OTC) medication, Tylenol (acetaminophen) is ok.  However, some narcotic medications that are given for pain control contain acetaminophen as well. Therefore, you should not exceed more than 4000 mg of tylenol in a day if you do not have liver disease.  Also note that there are may OTC medicines, such as cold medicines and allergy medicines that  my contain tylenol as well.  If you have any questions about medications and/or interactions please ask your doctor/PA or your pharmacist.      ICE AND ELEVATE INJURED/OPERATIVE EXTREMITY  Using ice and elevating the injured extremity above your heart can help with swelling and pain control.  Icing in a pulsatile fashion, such as 20 minutes on and 20 minutes off, can be followed.    Do not place ice directly on skin. Make sure there is a barrier between to skin and the ice pack.    Using frozen items such as frozen peas works well as the conform nicely to the are that needs to be iced.  USE AN ACE WRAP OR TED HOSE FOR SWELLING CONTROL  In addition to icing and elevation, Ace wraps or TED hose are used to help limit and resolve swelling.  It is recommended to use Ace wraps or TED hose until you are informed to stop.    When using Ace Wraps start the wrapping distally (farthest away from the body) and wrap proximally (closer to the body)   Example: If you had surgery on your leg or thing and you do not have a splint on, start the ace wrap at the toes and work your way up to the thigh        If you had surgery on your upper extremity and do not have a splint on, start the ace wrap at your fingers and work your way up to the upper arm   IF YOU ARE IN A SPLINT OR CAST DO NOT [place]   If your splint gets wet for any reason please contact the office immediately. You may shower in your splint or cast as long as you keep it dry.  This can be done by wrapping in a cast cover or garbage back (or similar)  Do Not stick any thing down your splint or cast such as pencils, money, or hangers to try and scratch yourself with.  If you feel itchy take benadryl as prescribed on the bottle for itching  IF YOU ARE IN A CAM BOOT (BLACK BOOT)  You may remove boot periodically. Perform daily dressing changes as noted below.  Wash the liner of the boot regularly and wear a sock when wearing the boot. It is recommended that you sleep in the boot until told otherwise  CALL THE OFFICE WITH ANY QUESTIONS OR CONCERNS: [phone]   Driving restrictions   Complete by:  As directed    No driving   Increase activity slowly as tolerated   Complete by:  As directed    Lifting restrictions   Complete by:  As directed    No lifting   Non weight bearing   Complete by:  As directed    Laterality:  right   Extremity:  Both     Allergies as of 03/14/2019   No Known Allergies     Medication List    STOP taking these medications   FISH OIL PO     TAKE these medications   ascorbic acid 500 MG tablet Commonly known as:  VITAMIN C Take 1 tablet (500 mg total) by mouth daily.   aspirin EC 81 MG tablet Take 81 mg by mouth daily.   enoxaparin 40 MG/0.4ML injection Commonly known as:  LOVENOX Inject 0.4 mLs (40 mg total) into the skin daily.   enoxaparin Kit Commonly known as:  LOVENOX 1 kit by Does not apply route  once for 1 dose.   gabapentin 300 MG capsule Commonly known as:  NEURONTIN Take 1 capsule (300 mg total) by mouth 3 (three) times daily.   methocarbamol 500 MG tablet Commonly known as:  ROBAXIN Take 1-2 tablets (500-1,000 mg total) by mouth every 6 (six) hours as needed for muscle spasms.   multivitamin with  minerals Tabs tablet Take 1 tablet by mouth daily.   ondansetron 4 MG tablet Commonly known as:  ZOFRAN Take 1 tablet (4 mg total) by mouth every 6 (six) hours as needed for nausea.   oxyCODONE-acetaminophen 7.5-325 MG tablet Commonly known as:  Percocet Take 1-2 tablets by mouth every 6 (six) hours as needed for moderate pain or severe pain.   propranolol 10 MG tablet Commonly known as:  INDERAL Take 10 mg by mouth 2 (two) times daily as needed (per pt).   sertraline 100 MG tablet Commonly known as:  ZOLOFT Take 150 mg by mouth daily.   Vitamin D3 125 MCG (5000 UT) Tabs Take 1 tablet (5,000 Units total) by mouth daily.            Durable Medical Equipment  (From admission, onward)         Start     Ordered   03/12/19 1439  For home use only DME 3 n 1  Once     03/12/19 1440   03/12/19 1439  For home use only DME Walker platform  Once    Comments:  Needs right arm platform  Question:  Patient needs a walker to treat with the following condition  Answer:  Right wrist fracture   03/12/19 1440   03/12/19 1439  For home use only DME Walker rolling  Once    Question:  Patient needs a walker to treat with the following condition  Answer:  Pilon fracture   03/12/19 1440           Discharge Care Instructions  (From admission, onward)         Start     Ordered   03/14/19 0000  Non weight bearing    Question Answer Comment  Laterality right   Extremity Both      03/14/19 0937         Follow-up Information    Haddix, Thomasene Lot, MD. Schedule an appointment as soon as possible for a visit on 03/17/2019.   Specialty:  Orthopedic Surgery Contact information: [place] [address] [phone]           Discharge Instructions and Plan:  58 year old male s/p fall from 5 foot ladder   Injuries: 1. Type IIIA right open pilon fracture s/p I&D and placement of external fixator ad incisional wound vac on 03/11/19 2. Right intra-articular distal  radius fracture s/p ORIF by Dr. Caralyn Guile on 03/11/19                  Weightbearing: NWB RLE, NWB right wrist, WBAT through right elbow                  Insicional and dressing care: Dressings are clean, dry, intact. Change pin site dressings as needed.                  Orthopedic device(s): RUE splint, RLE external fixator    Pain management: 1. Percocet 7.5/325 1-2 po q6h prn moderate/severe pain 2. Robaxin 500 mg 1-2 po q6h prn spasms 3. Neurontin 300 mg 1 po q8h scheduled  4. Vitamin c 500 mg po daily      VTE prophylaxis: Lovenox 40 mg daily   Diet: regular    Medical co-morbidities: Anxiety, GERD   Dispo: dc home today, follow up at office on 03/17/2019   Signed:  Jari Pigg, PA-C [phone] (C) 03/14/2019, 9:40 AM  [facility] [place] [phone] Domingo Sep (F)

## 2019-03-14 NOTE — Plan of Care (Signed)
  Problem: Activity: Goal: Ability to avoid complications of mobility impairment will improve Outcome: Progressing Goal: Ability to tolerate increased activity will improve Outcome: Progressing   Problem: Education: Goal: Verbalization of understanding the information provided will improve Outcome: Progressing   Problem: Physical Regulation: Goal: Postoperative complications will be avoided or minimized Outcome: Progressing   Problem: Pain Management: Goal: Pain level will decrease Outcome: Progressing   Problem: Skin Integrity: Goal: Signs of wound healing will improve Outcome: Progressing   

## 2019-03-14 NOTE — Progress Notes (Signed)
Physical Therapy Treatment Patient Details Name: Jonathan Bass MRN: [id number] DOB: 1961/04/13 Today's Date: 03/14/2019    History of Present Illness 58 year old male who fell off a ladder sustaining a type III a open right pilon fracture and a right intra-articular distal radius fracture. He had Rt LE closed reduction and external fixation of right pilon fracture using Synthes large medial and external fixation on 03/11/19. And Right intra-articular distal radius fracture s/p ORIF by Dr. Melvyn Novas on 03/11/19.    PT Comments    Pt progressing well towards goals. Required min guard A for ambulation using R platform RW and required min to min guard A for stair navigation. Gave handout and gait belt for increasing safety during stair navigation at home. Reports wife can assist as needed with mobility tasks. Current recommendations appropriate. Will continue to follow acutely to maximize functional mobility independence and safety.    Follow Up Recommendations  Home health PT;Supervision/Assistance - 24 hour;Supervision for mobility/OOB     Equipment Recommendations  Other (comment)(platform RW for R side)    Recommendations for Other Services       Precautions / Restrictions Precautions Precautions: Fall Required Braces or Orthoses: Splint/Cast(R wrist) Splint/Cast: Rt wrist, External fixator Rt ankle Restrictions Weight Bearing Restrictions: Yes RUE Weight Bearing: Non weight bearing RLE Weight Bearing: Non weight bearing Other Position/Activity Restrictions: OK to weight bear through R elbow    Mobility  Bed Mobility Overal bed mobility: Modified Independent                Transfers Overall transfer level: Needs assistance Equipment used: Right platform walker Transfers: Sit to/from Stand Sit to Stand: Min guard         General transfer comment: Min guard for steadying assist to come to standing. Demonstrated safe hand placement.    Ambulation/Gait Ambulation/Gait assistance: Min guard Gait Distance (Feet): 150 Feet Assistive device: Right platform walker Gait Pattern/deviations: Step-to pattern Gait velocity: slowed   General Gait Details: Min guard for steadying assist. Demonstrated safe gait pattern and would take standing rest breaks appropriately as he fatigued. Able to maintain NWB throughout.    Stairs Stairs: Yes Stairs assistance: Min assist;Min guard;+2 safety/equipment Stair Management: Backwards;Forwards;Step to pattern;With walker(R platform RW ) Number of Stairs: 2(X2) General stair comments: Practiced ascending/descending 2 steps X2. Required min min guard A for steadying and +2 to brace RW during stair training. Cues for sequencing using RW. Pt able to maintain NWB. Gave handout for stair navigation using RW and gait belt for increased safety when wife assisting at home.    Wheelchair Mobility    Modified Rankin (Stroke Patients Only)       Balance Overall balance assessment: Needs assistance Sitting-balance support: No upper extremity supported Sitting balance-Leahy Scale: Good     Standing balance support: Bilateral upper extremity supported;During functional activity Standing balance-Leahy Scale: Poor Standing balance comment: needs UE support at this time due to NWB on Rt LE and Rt wrist                            Cognition Arousal/Alertness: Awake/alert Behavior During Therapy: WFL for tasks assessed/performed Overall Cognitive Status: Within Functional Limits for tasks assessed                                        Exercises  General Comments        Pertinent Vitals/Pain Pain Assessment: Faces Faces Pain Scale: Hurts a little bit Pain Location: Rt wrist and Rt ankle Pain Descriptors / Indicators: Aching;Discomfort Pain Intervention(s): Monitored during session;Limited activity within patient's tolerance;Repositioned    Home Living                       Prior Function            PT Goals (current goals can now be found in the care plan section) Acute Rehab PT Goals Patient Stated Goal: to go home  PT Goal Formulation: With patient Time For Goal Achievement: 03/26/19 Potential to Achieve Goals: Good Progress towards PT goals: Progressing toward goals    Frequency    Min 5X/week      PT Plan Current plan remains appropriate    Co-evaluation              AM-PAC PT "6 Clicks" Mobility   Outcome Measure  Help needed turning from your back to your side while in a flat bed without using bedrails?: A Little Help needed moving from lying on your back to sitting on the side of a flat bed without using bedrails?: A Little Help needed moving to and from a bed to a chair (including a wheelchair)?: A Little Help needed standing up from a chair using your arms (e.g., wheelchair or bedside chair)?: A Little Help needed to walk in hospital room?: A Little Help needed climbing 3-5 steps with a railing? : A Little 6 Click Score: 18    End of Session Equipment Utilized During Treatment: Gait belt Activity Tolerance: Patient tolerated treatment well Patient left: in chair;with call bell/phone within reach Nurse Communication: Mobility status;Weight bearing status PT Visit Diagnosis: Unsteadiness on feet (R26.81);Other abnormalities of gait and mobility (R26.89);Muscle weakness (generalized) (M62.81);Pain;Difficulty in walking, not elsewhere classified (R26.2) Pain - Right/Left: Right Pain - part of body: Hand;Ankle and joints of foot     Time: 4163-8453 PT Time Calculation (min) (ACUTE ONLY): 24 min  Charges:  $Gait Training: 23-37 mins                     Gladys Damme, PT, DPT  Acute Rehabilitation Services  Pager: [phone] Office: [phone]    Lehman Prom 03/14/2019, 1:15 PM

## 2019-03-16 ENCOUNTER — Encounter (HOSPITAL_COMMUNITY): Payer: Self-pay | Admitting: Student

## 2019-03-16 ENCOUNTER — Telehealth: Payer: Self-pay | Admitting: *Deleted

## 2019-03-16 NOTE — Telephone Encounter (Signed)
-----   Message from Marykay Lex, MD sent at 03/04/2019  5:58 PM EDT -----  Predominant Rhythm was NSR - Avg HR 67 bpm.  1 short burt of 4 beats NSVT noted - related to patient trigger.  Otherwise minimal PACs or PVCs & no other arrhtymias noted.   Relatively benign monitor findings.  Would not usually do much further evaluation for ~ 4 PVCs in a row.  Treatment would be based upon symptoms.  Recommend avoiding triggers => stress, caffeine, sugar/sweets, decongestants.  Bryan Lemma, MD

## 2019-03-16 NOTE — Telephone Encounter (Signed)
Left message to call back - for results of holter monitor

## 2019-03-20 ENCOUNTER — Encounter (HOSPITAL_COMMUNITY): Payer: Self-pay | Admitting: Student

## 2019-03-24 ENCOUNTER — Encounter (HOSPITAL_COMMUNITY): Payer: Self-pay | Admitting: *Deleted

## 2019-03-24 ENCOUNTER — Other Ambulatory Visit: Payer: Self-pay

## 2019-03-24 NOTE — Progress Notes (Signed)
Mr. Wlodarski denies chest pain or shortness of breath. Patient denies that he or his family has experienced any of the following: Cough Fever >100.4 Runny Nose Sore Throat Difficulty breathing/ shortness of breath Travel in past 14 days.  Mr. Fudala was seen in the ED in November 2019 and January 2020 with chest pain.  Patient was then seen by Dr.Harding and had an ECHO and wore a heart monitor. Dr. Herbie Baltimore felt that the pain was caused by "heartburn"-patient reported. Mr Ballerini states he took Nexium for 2 weeks and has not experienced the pain again. I instructed patient to hold Ibuprofen and Vitamins.  Mr Heber has an appointment with Dr Jena Gauss this afternoon and surgery may be cancelled if there is too much swelling. PCP is Dr. Zachery Dauer with [facility].

## 2019-03-25 ENCOUNTER — Ambulatory Visit (HOSPITAL_COMMUNITY): Admission: RE | Admit: 2019-03-25 | Payer: 59 | Source: Home / Self Care | Admitting: Student

## 2019-03-25 HISTORY — DX: Gastro-esophageal reflux disease without esophagitis: K21.9

## 2019-03-25 SURGERY — OPEN REDUCTION INTERNAL FIXATION (ORIF) TIBIA/FIBULA FRACTURE
Anesthesia: General | Laterality: Right

## 2019-05-05 NOTE — H&P (Signed)
Orthopaedic Trauma Service (OTS) H&P  Patient ID: Jonathan Bass MRN: [id number] DOB/AGE: 60-24-1962 60 y.o.  Reason for Surgery: Removal of right ankle external fixation and placement of cast   HPI: Jonathan Bass is an 58 y.o. male presenting for removal of external fixator and subsequent placement of cast to right ankle. Patient had a fall from ladder on 03/11/19 which resulted in right open pilon fracture. He was placed in external fixator on that day with original plan to return to OR for definitive fixation. Patient was followed in the outpatient orthopaedic trauma clinic for several weeks, and unfortunately swelling of the skin overlying ankle did not improve enough to return to the operating room to safely proceed with open reduction internal fixation of the ankle. It was ultimately decided that due to the swelling and presence of wounds over anterior ankle, this injury would best be treated with external fixation of the ankle for 6 weeks. Patient has tolerated the fixator well and has been diligent with dressings his wounds daily. He has been compliant with non-weightbearing status on the leg. He now presents for removal of the external fixator and placement of cast to the right ankle.   Past Medical History:  Diagnosis Date  . Anxiety   . GERD (gastroesophageal reflux disease)    02/21/2019- not current   . Pulmonary nodule 10/2014   Noted on CT scan -> stable and follow-up 2019.  No further evaluation necessary.    Past Surgical History:  Procedure Laterality Date  . CLOSED REDUCTION WRIST FRACTURE Right 03/11/2019   Procedure: CLOSED REDUCTION WRIST;  Surgeon: [name], MD;  Location: [facility] OR;  Service: Orthopedics;  Laterality: Right;  . COLONOSCOPY    . EXTERNAL FIXATION LEG Right 03/11/2019   Procedure: EXTERNAL FIXATION LEG;  Surgeon: [name], MD;  Location: [facility] OR;  Service: Orthopedics;  Laterality: Right;  . FOOT SURGERY Bilateral    hammer toe  . HERNIA  REPAIR Bilateral 2009   Inguinial  . I&D EXTREMITY Right 03/11/2019   Procedure: IRRIGATION AND DEBRIDEMENT EXTREMITY;  Surgeon: [name], MD;  Location: [facility] OR;  Service: Orthopedics;  Laterality: Right;  . ORIF ANKLE FRACTURE Right 03/11/2019   Procedure: OPEN REDUCTION INTERNAL FIXATION (ORIF) ANKLE FRACTURE;  Surgeon: [name], MD;  Location: [facility] OR;  Service: Orthopedics;  Laterality: Right;  . ORIF WRIST FRACTURE Right 03/11/2019   Procedure: OPEN REDUCTION INTERNAL FIXATION (ORIF) WRIST FRACTURE;  Surgeon: [name], MD;  Location: [facility] OR;  Service: Orthopedics;  Laterality: Right;  . TONSILLECTOMY      Family History  Problem Relation Age of Onset  . Prostate cancer Father   . Valvular heart disease Father        Had a heart valve replaced (unsure of details)  . Breast cancer Mother   . Parkinson's disease Mother   . Healthy Sister   . Healthy Brother   . Parkinson's disease Maternal Grandmother   . Diabetes Maternal Grandfather     Social History:  reports that he has never smoked. He has never used smokeless tobacco. He reports that he does not drink alcohol or use drugs.  Allergies: No Known Allergies  Medications: I have reviewed the patient's current medications.  ROS: Constitutional: No fever or chills Vision: No changes in vision ENT: No difficulty swallowing CV: No chest pain Pulm: No SOB or wheezing GI: No nausea or vomiting GU: No urgency or inability to hold urine Skin: +  large escharanterior ankle Neurologic: No numbness or tingling Psychiatric: No depression or anxiety Heme: No bruising Allergic: No reaction to medications or food   Exam: There were no vitals taken for this visit. General: Well appearing, NAD Orientation:Alert and oriented x 3. Mood and Affect: Mood and affect appropriate Gait: Gait not assessed  Coordination and balance: Within normal limits  Right Lower Extremity: Ex-fix in place. Eschar present on anterior  ankle. Swelling on ankle and foot improving. Full knee ROM. Able to wiggle toes. Sensation intact distally. +DP pulse  Left Lower Extremity: Skin without lesions. No tenderness to palpation. Full painless ROM, full strength in each muscle group without evidence of instability.   Medical Decision Making: Imaging: AP, lateral, and oblique views of right ankle shows comminuted fracture with fairly good alignment. Some callus formation has occurred over the fracture. Ankle mortise well maintained.   Labs: No results found for this or any previous visit (from the past 24 hour(s)).   Medical history and chart was reviewed  Assessment/Plan: 58 year old male s/p fall from ladder which resulted in right open pilon fracture, initially treated with external fixation on 3/35/20.  Plan to take patient to operative suite for removal of external fixator and placement of cast. Discussed plan with patient and wife in the clinic and he would like to proceed with removal of the external fixation device at this time. Patient will be non-weightbearing on the right lower extremity and will be discharged home following the procedure.     Jonathan Perz A. Ladonna SnideYacobi, PA-C [facility] ?[phone]? (phone)

## 2019-05-06 ENCOUNTER — Other Ambulatory Visit (HOSPITAL_COMMUNITY)
Admission: RE | Admit: 2019-05-06 | Discharge: 2019-05-06 | Disposition: A | Discharge status: Home / Self Care | Payer: 59 | Source: Ambulatory Visit | Attending: Student | Admitting: Student

## 2019-05-06 DIAGNOSIS — Z1159 Encounter for screening for other viral diseases: Secondary | ICD-10-CM | POA: Diagnosis present

## 2019-05-06 LAB — SARS CORONAVIRUS 2 BY RT PCR (HOSPITAL ORDER, PERFORMED IN ~~LOC~~ HOSPITAL LAB): SARS Coronavirus 2: NEGATIVE

## 2019-05-07 ENCOUNTER — Other Ambulatory Visit: Payer: Self-pay

## 2019-05-07 NOTE — Progress Notes (Signed)
Patient instructed to arrive to [facility], Entrance A at 0530.  Patient given instructions to be NPO after midnight and verbalized understanding.  Instructed patient that if needed he could take Propranolol, Hydrocodone-acetaminophen, Sertraline (Zoloft), Gabapentin (Neurontin).  Patient instructed to stop taking Ibuprofen, vitamins, and aspirin.    Patient denies fever, shortness of breath, cough, nausea, vomiting, diarrhea, loss of smell, loss of taste.  Patient states that he has quarantined since he had his Covid testing completed.

## 2019-05-07 NOTE — Anesthesia Preprocedure Evaluation (Addendum)
Anesthesia Evaluation  Patient identified by MRN, date of birth, ID band Patient awake    Reviewed: Allergy & Precautions, H&P , NPO status , Patient's Chart, lab work & pertinent test results  Airway Mallampati: I  TM Distance: >3 FB Neck ROM: Full    Dental no notable dental hx. (+) Teeth Intact, Dental Advisory Given   Pulmonary neg pulmonary ROS,    Pulmonary exam normal breath sounds clear to auscultation       Cardiovascular Exercise Tolerance: Good negative cardio ROS Normal cardiovascular exam Rhythm:Regular Rate:Normal  02/03/2019  Echocardiogram results: Normal left ventricle size and function. Ejection fraction 55 to 60%. Normal wall motion. Grade 1 diastolic dysfunction, impaired relaxation.   Neuro/Psych Anxiety negative neurological ROS  negative psych ROS   GI/Hepatic Neg liver ROS, GERD  ,  Endo/Other  negative endocrine ROS  Renal/GU negative Renal ROS  negative genitourinary   Musculoskeletal negative musculoskeletal ROS (+)   Abdominal   Peds negative pediatric ROS (+)  Hematology negative hematology ROS (+)   Anesthesia Other Findings   Reproductive/Obstetrics negative OB ROS                            Anesthesia Physical Anesthesia Plan  ASA: II  Anesthesia Plan: General   Post-op Pain Management:    Induction: Intravenous  PONV Risk Score and Plan: 2 and Ondansetron and Treatment may vary due to age or medical condition  Airway Management Planned: LMA  Additional Equipment:   Intra-op Plan:   Post-operative Plan:   Informed Consent: I have reviewed the patients History and Physical, chart, labs and discussed the procedure including the risks, benefits and alternatives for the proposed anesthesia with the patient or authorized representative who has indicated his/her understanding and acceptance.       Plan Discussed with: CRNA, Surgeon and  Anesthesiologist  Anesthesia Plan Comments: ( )       Anesthesia Quick Evaluation

## 2019-05-08 ENCOUNTER — Other Ambulatory Visit: Payer: Self-pay

## 2019-05-08 ENCOUNTER — Ambulatory Visit (HOSPITAL_COMMUNITY): Payer: 59 | Admitting: Anesthesiology

## 2019-05-08 ENCOUNTER — Encounter (HOSPITAL_COMMUNITY)
Admission: RE | Disposition: A | Discharge status: Home / Self Care | Payer: Self-pay | Source: Home / Self Care | Attending: Student

## 2019-05-08 ENCOUNTER — Ambulatory Visit (HOSPITAL_COMMUNITY): Payer: 59

## 2019-05-08 ENCOUNTER — Encounter (HOSPITAL_COMMUNITY): Payer: Self-pay

## 2019-05-08 ENCOUNTER — Ambulatory Visit (HOSPITAL_COMMUNITY)
Admission: RE | Admit: 2019-05-08 | Discharge: 2019-05-08 | Disposition: A | Discharge status: Home / Self Care | Payer: 59 | Attending: Student | Admitting: Student

## 2019-05-08 DIAGNOSIS — W11XXXD Fall on and from ladder, subsequent encounter: Secondary | ICD-10-CM | POA: Diagnosis not present

## 2019-05-08 DIAGNOSIS — Z419 Encounter for procedure for purposes other than remedying health state, unspecified: Secondary | ICD-10-CM

## 2019-05-08 DIAGNOSIS — S82871F Displaced pilon fracture of right tibia, subsequent encounter for open fracture type IIIA, IIIB, or IIIC with routine healing: Secondary | ICD-10-CM | POA: Diagnosis not present

## 2019-05-08 DIAGNOSIS — S82871C Displaced pilon fracture of right tibia, initial encounter for open fracture type IIIA, IIIB, or IIIC: Secondary | ICD-10-CM | POA: Diagnosis present

## 2019-05-08 HISTORY — PX: EXTERNAL FIXATION REMOVAL: SHX5040

## 2019-05-08 LAB — CBC
HCT: 41.9 % (ref 39.0–52.0)
Hemoglobin: 13.7 g/dL (ref 13.0–17.0)
MCH: 29.7 pg (ref 26.0–34.0)
MCHC: 32.7 g/dL (ref 30.0–36.0)
MCV: 90.9 fL (ref 80.0–100.0)
Platelets: 238 10*3/uL (ref 150–400)
RBC: 4.61 MIL/uL (ref 4.22–5.81)
RDW: 13.2 % (ref 11.5–15.5)
WBC: 6.4 10*3/uL (ref 4.0–10.5)
nRBC: 0 % (ref 0.0–0.2)

## 2019-05-08 LAB — BASIC METABOLIC PANEL
Anion gap: 11 (ref 5–15)
BUN: 22 mg/dL — ABNORMAL HIGH (ref 6–20)
CO2: 23 mmol/L (ref 22–32)
Calcium: 9.7 mg/dL (ref 8.9–10.3)
Chloride: 106 mmol/L (ref 98–111)
Creatinine, Ser: 1.09 mg/dL (ref 0.61–1.24)
GFR calc Af Amer: >60 mL/min (ref >60)
GFR calc non Af Amer: >60 mL/min (ref >60)
Glucose, Bld: 100 mg/dL — ABNORMAL HIGH (ref 70–99)
Potassium: 3.9 mmol/L (ref 3.5–5.1)
Sodium: 140 mmol/L (ref 135–145)

## 2019-05-08 SURGERY — REMOVAL, EXTERNAL FIXATION DEVICE, LOWER EXTREMITY
Anesthesia: General | Site: Ankle | Laterality: Right

## 2019-05-08 MED ORDER — ONDANSETRON HCL 4 MG/2ML IJ SOLN
INTRAMUSCULAR | Status: DC | PRN
  Administered 2019-05-08: 4 mg via INTRAVENOUS

## 2019-05-08 MED ORDER — EPHEDRINE 5 MG/ML INJ
INTRAVENOUS | Status: AC
  Filled 2019-05-08: qty 10

## 2019-05-08 MED ORDER — MIDAZOLAM HCL 2 MG/2ML IJ SOLN
INTRAMUSCULAR | Status: AC
  Filled 2019-05-08: qty 2

## 2019-05-08 MED ORDER — LACTATED RINGERS IV SOLN
INTRAVENOUS | Status: DC | PRN
  Administered 2019-05-08: 07:00:00 via INTRAVENOUS

## 2019-05-08 MED ORDER — FENTANYL CITRATE (PF) 100 MCG/2ML IJ SOLN
INTRAMUSCULAR | Status: DC | PRN
  Administered 2019-05-08 (×2): 25 ug via INTRAVENOUS

## 2019-05-08 MED ORDER — PHENYLEPHRINE 40 MCG/ML (10ML) SYRINGE FOR IV PUSH (FOR BLOOD PRESSURE SUPPORT)
PREFILLED_SYRINGE | INTRAVENOUS | Status: AC
  Filled 2019-05-08: qty 10

## 2019-05-08 MED ORDER — OXYCODONE HCL 5 MG/5ML PO SOLN: 5.0000 mg | Freq: Once | ORAL | Status: DC | PRN

## 2019-05-08 MED ORDER — ONDANSETRON HCL 4 MG/2ML IJ SOLN
INTRAMUSCULAR | Status: AC
  Filled 2019-05-08: qty 2

## 2019-05-08 MED ORDER — LIDOCAINE 2% (20 MG/ML) 5 ML SYRINGE
INTRAMUSCULAR | Status: AC
  Filled 2019-05-08: qty 5

## 2019-05-08 MED ORDER — ONDANSETRON HCL 4 MG/2ML IJ SOLN: 4.0000 mg | Freq: Once | INTRAMUSCULAR | Status: DC | PRN

## 2019-05-08 MED ORDER — EPHEDRINE SULFATE-NACL 50-0.9 MG/10ML-% IV SOSY
PREFILLED_SYRINGE | INTRAVENOUS | Status: DC | PRN
  Administered 2019-05-08 (×2): 10 mg via INTRAVENOUS

## 2019-05-08 MED ORDER — DEXAMETHASONE SODIUM PHOSPHATE 10 MG/ML IJ SOLN
INTRAMUSCULAR | Status: AC
  Filled 2019-05-08: qty 1

## 2019-05-08 MED ORDER — ACETAMINOPHEN 160 MG/5ML PO SOLN: 325.0000 mg | ORAL | Status: DC | PRN

## 2019-05-08 MED ORDER — FENTANYL CITRATE (PF) 250 MCG/5ML IJ SOLN
INTRAMUSCULAR | Status: AC
  Filled 2019-05-08: qty 5

## 2019-05-08 MED ORDER — OXYCODONE HCL 5 MG PO TABS: 5.0000 mg | ORAL_TABLET | Freq: Once | ORAL | Status: DC | PRN

## 2019-05-08 MED ORDER — FENTANYL CITRATE (PF) 100 MCG/2ML IJ SOLN: 25.0000 ug | INTRAMUSCULAR | Status: DC | PRN

## 2019-05-08 MED ORDER — PROPOFOL 10 MG/ML IV BOLUS
INTRAVENOUS | Status: DC | PRN
  Administered 2019-05-08: 10 mg via INTRAVENOUS
  Administered 2019-05-08: 200 mg via INTRAVENOUS
  Administered 2019-05-08: 10 mg via INTRAVENOUS

## 2019-05-08 MED ORDER — CEFAZOLIN SODIUM-DEXTROSE 2-4 GM/100ML-% IV SOLN
2.0000 g | INTRAVENOUS | Status: DC
  Filled 2019-05-08: qty 100

## 2019-05-08 MED ORDER — ACETAMINOPHEN 325 MG PO TABS: 325.0000 mg | ORAL_TABLET | ORAL | Status: DC | PRN

## 2019-05-08 MED ORDER — LIDOCAINE 2% (20 MG/ML) 5 ML SYRINGE
INTRAMUSCULAR | Status: DC | PRN
  Administered 2019-05-08: 100 mg via INTRAVENOUS

## 2019-05-08 MED ORDER — PROPOFOL 10 MG/ML IV BOLUS
INTRAVENOUS | Status: AC
  Filled 2019-05-08: qty 20

## 2019-05-08 MED ORDER — MEPERIDINE HCL 25 MG/ML IJ SOLN: 6.2500 mg | INTRAMUSCULAR | Status: DC | PRN

## 2019-05-08 MED ORDER — MIDAZOLAM HCL 5 MG/5ML IJ SOLN
INTRAMUSCULAR | Status: DC | PRN
  Administered 2019-05-08: 2 mg via INTRAVENOUS

## 2019-05-08 MED ORDER — PHENYLEPHRINE 40 MCG/ML (10ML) SYRINGE FOR IV PUSH (FOR BLOOD PRESSURE SUPPORT)
PREFILLED_SYRINGE | INTRAVENOUS | Status: DC | PRN
  Administered 2019-05-08: 80 ug via INTRAVENOUS
  Administered 2019-05-08: 40 ug via INTRAVENOUS
  Administered 2019-05-08: 80 ug via INTRAVENOUS

## 2019-05-08 SURGICAL SUPPLY — 39 items
BANDAGE ACE 4X5 VEL STRL LF (GAUZE/BANDAGES/DRESSINGS) ×3 IMPLANT
BANDAGE ACE 6X5 VEL STRL LF (GAUZE/BANDAGES/DRESSINGS) ×3 IMPLANT
BNDG GAUZE ELAST 4 BULKY (GAUZE/BANDAGES/DRESSINGS) ×6 IMPLANT
BRUSH SCRUB SURG 4.25 DISP (MISCELLANEOUS) IMPLANT
CHLORAPREP W/TINT 26ML (MISCELLANEOUS) ×3 IMPLANT
COVER SURGICAL LIGHT HANDLE (MISCELLANEOUS) ×6 IMPLANT
COVER WAND RF STERILE (DRAPES) ×3 IMPLANT
DRAPE C-ARM 42X72 X-RAY (DRAPES) IMPLANT
DRAPE C-ARMOR (DRAPES) IMPLANT
DRAPE U-SHAPE 47X51 STRL (DRAPES) ×3 IMPLANT
DRSG ADAPTIC 3X8 NADH LF (GAUZE/BANDAGES/DRESSINGS) ×3 IMPLANT
ELECT REM PT RETURN 9FT ADLT (ELECTROSURGICAL) ×3
ELECTRODE REM PT RTRN 9FT ADLT (ELECTROSURGICAL) ×1 IMPLANT
GAUZE SPONGE 4X4 12PLY STRL (GAUZE/BANDAGES/DRESSINGS) ×3 IMPLANT
GLOVE BIO SURGEON STRL SZ 6.5 (GLOVE) ×6 IMPLANT
GLOVE BIO SURGEON STRL SZ7.5 (GLOVE) ×12 IMPLANT
GLOVE BIO SURGEONS STRL SZ 6.5 (GLOVE) ×3
GLOVE BIOGEL PI IND STRL 6.5 (GLOVE) ×1 IMPLANT
GLOVE BIOGEL PI IND STRL 7.5 (GLOVE) ×1 IMPLANT
GLOVE BIOGEL PI INDICATOR 6.5 (GLOVE) ×2
GLOVE BIOGEL PI INDICATOR 7.5 (GLOVE) ×2
GOWN STRL REUS W/ TWL LRG LVL3 (GOWN DISPOSABLE) ×2 IMPLANT
GOWN STRL REUS W/TWL LRG LVL3 (GOWN DISPOSABLE) ×4
KIT BASIN OR (CUSTOM PROCEDURE TRAY) ×3 IMPLANT
KIT TURNOVER KIT B (KITS) ×3 IMPLANT
MANIFOLD NEPTUNE II (INSTRUMENTS) IMPLANT
NS IRRIG 1000ML POUR BTL (IV SOLUTION) ×3 IMPLANT
PACK TOTAL JOINT (CUSTOM PROCEDURE TRAY) ×3 IMPLANT
PAD ARMBOARD 7.5X6 YLW CONV (MISCELLANEOUS) ×6 IMPLANT
PADDING CAST COTTON 6X4 STRL (CAST SUPPLIES) ×9 IMPLANT
SCOTCHCAST PLUS 3X4 WHITE (CAST SUPPLIES) ×9 IMPLANT
SPONGE LAP 18X18 RF (DISPOSABLE) ×3 IMPLANT
STAPLER VISISTAT 35W (STAPLE) IMPLANT
SUT MNCRL AB 3-0 PS2 18 (SUTURE) ×3 IMPLANT
SUT MON AB 2-0 CT1 36 (SUTURE) ×3 IMPLANT
TOWEL OR 17X24 6PK STRL BLUE (TOWEL DISPOSABLE) ×6 IMPLANT
TOWEL OR 17X26 10 PK STRL BLUE (TOWEL DISPOSABLE) ×6 IMPLANT
UNDERPAD 30X30 (UNDERPADS AND DIAPERS) ×3 IMPLANT
WATER STERILE IRR 1000ML POUR (IV SOLUTION) IMPLANT

## 2019-05-08 NOTE — Discharge Instructions (Signed)
Orthopaedic Trauma Service Discharge Instructions   General Discharge Instructions  WEIGHT BEARING STATUS: non-weightbearing right leg  RANGE OF MOTION/ACTIVITY: full unrestricted range of motion of the knee  Wound Care: do not remove the cast or get it wet  DVT/PE prophylaxis: Continue daily Aspirin 81mg   Diet: as you were eating previously.  Can use over the counter stool softeners and bowel preparations, such as Miralax, to help with bowel movements.  Narcotics can be constipating.  Be sure to drink plenty of fluids  PAIN MEDICATION USE AND EXPECTATIONS  You have likely been given narcotic medications to help control your pain.  After a traumatic event that results in an fracture (broken bone) with or without surgery, it is ok to use narcotic pain medications to help control one's pain.  We understand that everyone responds to pain differently and each individual patient will be evaluated on a regular basis for the continued need for narcotic medications. Ideally, narcotic medication use should last no more than 6-8 weeks (coinciding with fracture healing).   As a patient it is your responsibility as well to monitor narcotic medication use and report the amount and frequency you use these medications when you come to your office visit.   We would also advise that if you are using narcotic medications, you should take a dose prior to therapy to maximize you participation.  IF YOU ARE ON NARCOTIC MEDICATIONS IT IS NOT PERMISSIBLE TO OPERATE A MOTOR VEHICLE (MOTORCYCLE/CAR/TRUCK/MOPED) OR HEAVY MACHINERY DO NOT MIX NARCOTICS WITH OTHER CNS (CENTRAL NERVOUS SYSTEM) DEPRESSANTS SUCH AS ALCOHOL   STOP SMOKING OR USING NICOTINE PRODUCTS!!!!  As discussed nicotine severely impairs your body's ability to heal surgical and traumatic wounds but also impairs bone healing.  Wounds and bone heal by forming microscopic blood vessels (angiogenesis) and nicotine is a vasoconstrictor (essentially,  shrinks blood vessels).  Therefore, if vasoconstriction occurs to these microscopic blood vessels they essentially disappear and are unable to deliver necessary nutrients to the healing tissue.  This is one modifiable factor that you can do to dramatically increase your chances of healing your injury.    (This means no smoking, no nicotine gum, patches, etc)  DO NOT USE NONSTEROIDAL ANTI-INFLAMMATORY DRUGS (NSAID'S)  Using products such as Advil (ibuprofen), Aleve (naproxen), Motrin (ibuprofen) for additional pain control during fracture healing can delay and/or prevent the healing response.  If you would like to take over the counter (OTC) medication, Tylenol (acetaminophen) is ok.  However, some narcotic medications that are given for pain control contain acetaminophen as well. Therefore, you should not exceed more than 4000 mg of tylenol in a day if you do not have liver disease.  Also note that there are may OTC medicines, such as cold medicines and allergy medicines that my contain tylenol as well.  If you have any questions about medications and/or interactions please ask your doctor/PA or your pharmacist.      ICE AND ELEVATE INJURED/OPERATIVE EXTREMITY  Using ice and elevating the injured extremity above your heart can help with swelling and pain control.  Icing in a pulsatile fashion, such as 20 minutes on and 20 minutes off, can be followed.    Do not place ice directly on skin. Make sure there is a barrier between to skin and the ice pack.    Using frozen items such as frozen peas works well as the conform nicely to the are that needs to be iced.  USE AN ACE WRAP OR TED HOSE FOR  SWELLING CONTROL  In addition to icing and elevation, Ace wraps or TED hose are used to help limit and resolve swelling.  It is recommended to use Ace wraps or TED hose until you are informed to stop.    When using Ace Wraps start the wrapping distally (farthest away from the body) and wrap proximally (closer to the  body)   Example: If you had surgery on your leg or thing and you do not have a splint on, start the ace wrap at the toes and work your way up to the thigh        If you had surgery on your upper extremity and do not have a splint on, start the ace wrap at your fingers and work your way up to the upper arm  IF YOU ARE IN A SPLINT OR CAST DO NOT REMOVE IT FOR ANY REASON   If your splint gets wet for any reason please contact the office immediately. You may shower in your splint or cast as long as you keep it dry.  This can be done by wrapping in a cast cover or garbage back (or similar)  Do Not stick any thing down your splint or cast such as pencils, money, or hangers to try and scratch yourself with.  If you feel itchy take benadryl as prescribed on the bottle for itching   CALL THE OFFICE WITH ANY QUESTIONS OR CONCERNS: [phone]   VISIT OUR WEBSITE FOR ADDITIONAL INFORMATION: [email or web address]     Discharge Wound Care Instructions  Do NOT apply any ointments, solutions or lotions to pin sites or surgical wounds.  These prevent needed drainage and even though solutions like hydrogen peroxide kill bacteria, they also damage cells lining the pin sites that help fight infection.  Applying lotions or ointments can keep the wounds moist and can cause them to breakdown and open up as well. This can increase the risk for infection. When in doubt call the office.  Surgical incisions should be dressed daily.  If any drainage is noted, use one layer of adaptic, then gauze, Kerlix, and an ace wrap.  Once the incision is completely dry and without drainage, it may be left open to air out.  Showering may begin 36-48 hours later.  Cleaning gently with soap and water.  Traumatic wounds should be dressed daily as well.    One layer of adaptic, gauze, Kerlix, then ace wrap.  The adaptic can be discontinued once the draining has ceased    If you have a wet to dry dressing: wet the gauze with saline  the squeeze as much saline out so the gauze is moist (not soaking wet), place moistened gauze over wound, then place a dry gauze over the moist one, followed by Kerlix wrap, then ace wrap.

## 2019-05-08 NOTE — Op Note (Signed)
Orthopaedic Surgery Operative Note (CSN: [id number] ) Date of Surgery: 05/08/2019  Admit Date: 05/08/2019   Diagnoses: Pre-Op Diagnoses: Type IIIA open pilon fracture S/p External fixation   Post-Op Diagnosis: Same  Procedures: 1. CPT 20694-Removal of external fixator right ankle 2. CPT 29405-Application of short leg cast  Surgeons : Primary: Bass, Gillie Manners, MD  Assistant: Ulyses Southward, PA-C  Location: OR 4   Anesthesia:General  Antibiotics: Ancef 2g preop   Tourniquet time:None  Estimated Blood Loss:Minimal  Complications:None   Specimens:None   Implants: * No implants in log *   Indications for Surgery: 58 year old male who fell from a ladder and sustained a severe type III a open pilon fracture.  I performed irrigation debridement with external fixation.  Unfortunately over the course of the next month to 6 weeks the skin did not resolve significantly enough to allow for definitive fixation with incision.  After full discussion with the patient we decided on proceeding with external fixation for approximately 8 weeks and then removal with transition to a cast versus a boot depending on the stability of the fracture and amount of healing.  I discussed risks and benefits with the patient risks included but not limited to bleeding, infection, malunion, nonunion, posttraumatic arthritis, need for further surgery including ankle fusion, even the possibility of amputation.  The patient understands and wishes to proceed consent was obtained.  Operative Findings: 1.  Removal of external fixation with some motion still present at the fracture site in both the coronal and sagittal planes. 2.  Placement of short leg cast with a anterior wedge to improve the sagittal alignment.  Procedure: The patient was identified in the preoperative holding area. Consent was confirmed with the patient and their family and all questions were answered. The operative extremity was marked after  confirmation with the patient. he was then brought back to the operating room by our anesthesia colleagues.  He was placed under general anesthetic and carefully transferred over to a radiolucent flat top table.  A bump was placed under his operative hip.  A timeout was performed to verify the patient the procedure and the extremity.  Antibiotics were dosed.  I remove the external fixator without difficulty.  I curetted the ex-fix pin sites and irrigated them.  I then stressed the fracture under fluoroscopy which showed some instability noted in the coronal and sagittal planes.  At this point I felt that casting was most appropriate.  I placed gauze over the ex-fix pin sites.  I placed a stockinette and then well-padded cast padding was placed.  I then used fiberglass to mold a cast over the lower extremity.  Fluoroscopy was used to maintain alignment.  After the cast hardened there was a slight recurvatum deformity to the fracture.  As result I marked this out made an anterior univalve wedge and placed 1/4 inch plastic wedge and rewrapped it.  This improved the alignment in a near anatomic and acceptable position.  The patient was then awoken from anesthesia and taken to the PACU in stable condition.  Post Op Plan/Instructions: Patient will be nonweightbearing to the right lower extremity.  No DVT prophylaxis will be needed.  We will plan to have him return in 2 weeks with x-rays in the cast.  I imagine a total cast time 4 to 6 weeks.  I was present and performed the entire surgery.  Ulyses Southward, PA-C did assist me throughout the case. An assistant was necessary given the difficulty in approach, maintenance of  reduction and ability to instrument the fracture.   Jonathan MerleKevin Haddix, MD [facility]

## 2019-05-08 NOTE — Anesthesia Procedure Notes (Signed)
Procedure Name: LMA Insertion Date/Time: 05/08/2019 7:30 AM Performed by: Quentin Ore, CRNA Pre-anesthesia Checklist: Patient identified, Emergency Drugs available, Suction available and Patient being monitored Patient Re-evaluated:Patient Re-evaluated prior to induction Oxygen Delivery Method: Circle system utilized Preoxygenation: Pre-oxygenation with 100% oxygen Induction Type: IV induction LMA: LMA inserted LMA Size: 4.0 Number of attempts: 1 Placement Confirmation: positive ETCO2 and breath sounds checked- equal and bilateral Tube secured with: Tape Dental Injury: Teeth and Oropharynx as per pre-operative assessment

## 2019-05-08 NOTE — Interval H&P Note (Signed)
History and Physical Interval Note:  05/08/2019 7:21 AM  Jonathan Bass  has presented today for surgery, with the diagnosis of RIGHT TILON FRACTURE STATUS POST EXTERNAL FIXATOR.  The various methods of treatment have been discussed with the patient and family. After consideration of risks, benefits and other options for treatment, the patient has consented to  Procedure(s): REMOVAL EXTERNAL FIXATION ANKLE, PLACEMENT OF CAST (Right) as a surgical intervention.  The patient's history has been reviewed, patient examined, no change in status, stable for surgery.  I have reviewed the patient's chart and labs.  Questions were answered to the patient's satisfaction.     Caryn Bee P [facility] [place]

## 2019-05-08 NOTE — Anesthesia Postprocedure Evaluation (Signed)
Anesthesia Post Note  Patient: Jonathan Bass  Procedure(s) Performed: REMOVAL EXTERNAL FIXATION ANKLE, PLACEMENT OF CAST (Right Ankle)     Patient location during evaluation: PACU Anesthesia Type: General Level of consciousness: awake and alert Pain management: pain level controlled Vital Signs Assessment: post-procedure vital signs reviewed and stable Respiratory status: spontaneous breathing, nonlabored ventilation, respiratory function stable and patient connected to nasal cannula oxygen Cardiovascular status: blood pressure returned to baseline and stable Postop Assessment: no apparent nausea or vomiting Anesthetic complications: no    Last Vitals:  Vitals:   05/08/19 0853 05/08/19 0920  BP: 135/84 138/83  Pulse: 68 69  Resp: 13 14  Temp: (!) 36.2 C   SpO2: 99% 99%    Last Pain:  Vitals:   05/08/19 0853  TempSrc:   PainSc: 0-No pain                 Tashara Suder

## 2019-05-08 NOTE — Transfer of Care (Signed)
Immediate Anesthesia Transfer of Care Note  Patient: Jonathan Bass  Procedure(s) Performed: REMOVAL EXTERNAL FIXATION ANKLE, PLACEMENT OF CAST (Right Ankle)  Patient Location: PACU  Anesthesia Type:General  Level of Consciousness: awake, alert  and oriented  Airway & Oxygen Therapy: Patient Spontanous Breathing and Patient connected to nasal cannula oxygen  Post-op Assessment: Report given to RN, Post -op Vital signs reviewed and stable and Patient moving all extremities  Post vital signs: Reviewed and stable  Last Vitals:  Vitals Value Taken Time  BP 113/86 05/08/2019  8:26 AM  Temp    Pulse 75 05/08/2019  8:26 AM  Resp 10 05/08/2019  8:26 AM  SpO2 96 % 05/08/2019  8:26 AM  Vitals shown include unvalidated device data.  Last Pain:  Vitals:   05/08/19 0636  TempSrc:   PainSc: 0-No pain      Patients Stated Pain Goal: 2 (05/08/19 0636)  Complications: No apparent anesthesia complications

## 2019-05-09 ENCOUNTER — Encounter (HOSPITAL_COMMUNITY): Payer: Self-pay | Admitting: Student

## 2019-06-03 ENCOUNTER — Ambulatory Visit: Payer: 59 | Admitting: Family

## 2019-06-08 ENCOUNTER — Ambulatory Visit (INDEPENDENT_AMBULATORY_CARE_PROVIDER_SITE_OTHER): Payer: 59 | Admitting: Orthopedic Surgery

## 2019-06-08 ENCOUNTER — Encounter: Payer: Self-pay | Admitting: Orthopedic Surgery

## 2019-06-08 ENCOUNTER — Other Ambulatory Visit: Payer: Self-pay

## 2019-06-08 VITALS — Ht 71.0 in | Wt 195.0 lb

## 2019-06-08 DIAGNOSIS — Z8781 Personal history of (healed) traumatic fracture: Secondary | ICD-10-CM

## 2019-06-08 DIAGNOSIS — M86261 Subacute osteomyelitis, right tibia and fibula: Secondary | ICD-10-CM

## 2019-06-08 NOTE — H&P (View-Only) (Signed)
Office Visit Note   Patient: Jonathan Bass           Date of Birth: 1961-10-27           MRN: [id number] Visit Date: 06/08/2019              Requested by: Juluis RainierBarnes, Elizabeth, MD [address] [place] [id number] PCP: Juluis RainierBarnes, Elizabeth, MD  Chief Complaint  Patient presents with   Right Ankle - Routine Post Op    05/08/19 right ankle wound, NP      HPI: Patient is a 58 year old gentleman who is status post an open distal tibia and fibular fracture on 03/11/2019 after falling off a ladder.  Patient had significant soft tissue injury and underwent multiple surgical interventions for debridement, closure and bony stabilization.  Patient presents at this time for evaluation for a necrotic wound over the traumatic wound on the right distal leg.  Assessment & Plan: Visit Diagnoses:  1. 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  1 option would be to resect the necrotic tissue from the open fracture wound and  resect approximately a centimeter of the tibia and fibula to get back to healthy bone, this chronic wound may be due to a infection within the bone.  And then immobilization with either casting or external fixator.  Follow-Up Instructions: No follow-ups on file.   Ortho Exam  Patient is alert, oriented, no adenopathy, well-dressed, normal affect, normal respiratory effort. Examination the patient's right lower extremity is in a cast with a window over the traumatic wounds.  Patient's foot is neurovascular intact he has good warmth good capillary refill I cannot palpate a pulse due to the cast.  The necrotic wound is unroofed and this shows an area of approximately half a centimeter in diameter and the length of the traumatic wound that has necrotic tissue that extends all the way down to the nonunion fracture site.  There is no cellulitis no  odor no drainage.  Patient does have venous stasis swelling with the right lower extremity much larger than the left lower extremity.  He has good hair growth on the left lower extremity.  Imaging: No results found. No images are attached to the encounter.  Labs: No results found for: HGBA1C, ESRSEDRATE, CRP, LABURIC, REPTSTATUS, GRAMSTAIN, CULT, LABORGA   Lab Results  Component Value Date   ALBUMIN 4.0 03/11/2019   ALBUMIN 4.0 01/19/2012    Body mass index is 27.2 kg/m.  Orders:  No orders of the defined types were placed in this encounter.  No orders of the defined types were placed in this encounter.    Procedures: No procedures performed  Clinical Data: No additional findings.  ROS:  All other systems negative, except as noted in the HPI. Review of Systems  Objective: Vital Signs: Ht 5\' 11"  (1.803 m)    Wt 195 lb (88.5 kg)    BMI 27.20 kg/m   Specialty Comments:  No specialty comments available.  PMFS History: Patient Active Problem List   Diagnosis Date Noted   Anxiety    Open pilon fracture, right, type III, initial encounter 03/11/2019   Closed fracture of right distal radius 03/11/2019   Type III open pilon fracture of right tibia 03/11/2019   Pounding heartbeat 01/21/2019   Precordial chest pain 03/05/2014   Past  Medical History:  Diagnosis Date   Anxiety    GERD (gastroesophageal reflux disease)    02/21/2019- not current    Pulmonary nodule 10/2014   Noted on CT scan -> stable and follow-up 2019.  No further evaluation necessary.    Family History  Problem Relation Age of Onset   Prostate cancer Father    Valvular heart disease Father        Had a heart valve replaced (unsure of details)   Breast cancer Mother    Parkinson's disease Mother    Healthy Sister    Healthy Brother    Parkinson's disease Maternal Grandmother    Diabetes Maternal Grandfather     Past Surgical History:  Procedure Laterality Date   CLOSED  REDUCTION WRIST FRACTURE Right 03/11/2019   Procedure: CLOSED REDUCTION WRIST;  Surgeon: Shona Needles, MD;  Location: [facility];  Service: Orthopedics;  Laterality: Right;   COLONOSCOPY     EXTERNAL FIXATION LEG Right 03/11/2019   Procedure: EXTERNAL FIXATION LEG;  Surgeon: Shona Needles, MD;  Location: [facility];  Service: Orthopedics;  Laterality: Right;   EXTERNAL FIXATION REMOVAL Right 05/08/2019   Procedure: REMOVAL EXTERNAL FIXATION ANKLE, PLACEMENT OF CAST;  Surgeon: Shona Needles, MD;  Location: [facility];  Service: Orthopedics;  Laterality: Right;   FOOT SURGERY Bilateral    hammer toe   HERNIA REPAIR Bilateral 2009   Inguinial   I&D EXTREMITY Right 03/11/2019   Procedure: IRRIGATION AND DEBRIDEMENT EXTREMITY;  Surgeon: Shona Needles, MD;  Location: [facility];  Service: Orthopedics;  Laterality: Right;   ORIF ANKLE FRACTURE Right 03/11/2019   Procedure: OPEN REDUCTION INTERNAL FIXATION (ORIF) ANKLE FRACTURE;  Surgeon: Shona Needles, MD;  Location: [facility];  Service: Orthopedics;  Laterality: Right;   ORIF WRIST FRACTURE Right 03/11/2019   Procedure: OPEN REDUCTION INTERNAL FIXATION (ORIF) WRIST FRACTURE;  Surgeon: Shona Needles, MD;  Location: [facility];  Service: Orthopedics;  Laterality: Right;   TONSILLECTOMY     Social History   Occupational History   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

## 2019-06-08 NOTE — Progress Notes (Signed)
° °Office Visit Note °  °Patient: Jonathan Bass           °Date of Birth: 12/29/1960           °MRN: [id number] °Visit Date: 06/08/2019 °             °Requested by: Barnes, Elizabeth, MD °[address] °[place],  [place] [address] °PCP: Barnes, Elizabeth, MD ° °Chief Complaint  °Patient presents with  °• Right Ankle - Routine Post Op  °  05/08/19 right ankle wound, NP  ° ° ° ° °HPI: °Patient is a 58-year-old gentleman who is status post an open distal tibia and fibular fracture on 03/11/2019 after falling off a ladder.  Patient had significant soft tissue injury and underwent multiple surgical interventions for debridement, closure and bony stabilization.  Patient presents at this time for evaluation for a necrotic wound over the traumatic wound on the right distal leg. ° °Assessment & Plan: °Visit Diagnoses:  °1. 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  1 option would be to resect the necrotic tissue from the open fracture wound and  resect approximately a centimeter of the tibia and fibula to get back to healthy bone, this chronic wound may be due to a infection within the bone.  And then immobilization with either casting or external fixator. ° °Follow-Up Instructions: No follow-ups on file.  ° °Ortho Exam ° °Patient is alert, oriented, no adenopathy, well-dressed, normal affect, normal respiratory effort. °Examination the patient's right lower extremity is in a cast with a window over the traumatic wounds.  Patient's foot is neurovascular intact he has good warmth good capillary refill I cannot palpate a pulse due to the cast.  The necrotic wound is unroofed and this shows an area of approximately half a centimeter in diameter and the length of the traumatic wound that has necrotic tissue that extends all the way down to the nonunion fracture site.  There is no cellulitis no  odor no drainage.  Patient does have venous stasis swelling with the right lower extremity much larger than the left lower extremity.  He has good hair growth on the left lower extremity. ° °Imaging: °No results found. °No images are attached to the encounter. ° °Labs: °No results found for: HGBA1C, ESRSEDRATE, CRP, LABURIC, REPTSTATUS, GRAMSTAIN, CULT, LABORGA ° ° °Lab Results  °Component Value Date  ° ALBUMIN 4.0 03/11/2019  ° ALBUMIN 4.0 01/19/2012  ° ° °Body mass index is 27.2 kg/m². ° °Orders:  °No orders of the defined types were placed in this encounter. ° °No orders of the defined types were placed in this encounter. ° ° ° Procedures: °No procedures performed ° °Clinical Data: °No additional findings. ° °ROS: ° °All other systems negative, except as noted in the HPI. °Review of Systems ° °Objective: °Vital Signs: Ht 5' 11" (1.803 m)    Wt 195 lb (88.5 kg)    BMI 27.20 kg/m²  ° °Specialty Comments:  °No specialty comments available. ° °PMFS History: °Patient Active Problem List  ° Diagnosis Date Noted  °• Anxiety   °• Open pilon fracture, right, type III, initial encounter 03/11/2019  °• Closed fracture of right distal radius 03/11/2019  °• Type III open pilon fracture of right tibia 03/11/2019  °• Pounding heartbeat 01/21/2019  °• Precordial chest pain 03/05/2014  ° °Past   Medical History:  Diagnosis Date   Anxiety    GERD (gastroesophageal reflux disease)    02/21/2019- not current    Pulmonary nodule 10/2014   Noted on CT scan -> stable and follow-up 2019.  No further evaluation necessary.    Family History  Problem Relation Age of Onset   Prostate cancer Father    Valvular heart disease Father        Had a heart valve replaced (unsure of details)   Breast cancer Mother    Parkinson's disease Mother    Healthy Sister    Healthy Brother    Parkinson's disease Maternal Grandmother    Diabetes Maternal Grandfather     Past Surgical History:  Procedure Laterality Date   CLOSED  REDUCTION WRIST FRACTURE Right 03/11/2019   Procedure: CLOSED REDUCTION WRIST;  Surgeon: Shona Needles, MD;  Location: [facility];  Service: Orthopedics;  Laterality: Right;   COLONOSCOPY     EXTERNAL FIXATION LEG Right 03/11/2019   Procedure: EXTERNAL FIXATION LEG;  Surgeon: Shona Needles, MD;  Location: [facility];  Service: Orthopedics;  Laterality: Right;   EXTERNAL FIXATION REMOVAL Right 05/08/2019   Procedure: REMOVAL EXTERNAL FIXATION ANKLE, PLACEMENT OF CAST;  Surgeon: Shona Needles, MD;  Location: [facility];  Service: Orthopedics;  Laterality: Right;   FOOT SURGERY Bilateral    hammer toe   HERNIA REPAIR Bilateral 2009   Inguinial   I&D EXTREMITY Right 03/11/2019   Procedure: IRRIGATION AND DEBRIDEMENT EXTREMITY;  Surgeon: Shona Needles, MD;  Location: [facility];  Service: Orthopedics;  Laterality: Right;   ORIF ANKLE FRACTURE Right 03/11/2019   Procedure: OPEN REDUCTION INTERNAL FIXATION (ORIF) ANKLE FRACTURE;  Surgeon: Shona Needles, MD;  Location: [facility];  Service: Orthopedics;  Laterality: Right;   ORIF WRIST FRACTURE Right 03/11/2019   Procedure: OPEN REDUCTION INTERNAL FIXATION (ORIF) WRIST FRACTURE;  Surgeon: Shona Needles, MD;  Location: [facility];  Service: Orthopedics;  Laterality: Right;   TONSILLECTOMY     Social History   Occupational History   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

## 2019-06-09 ENCOUNTER — Other Ambulatory Visit: Payer: Self-pay

## 2019-06-09 ENCOUNTER — Encounter (HOSPITAL_COMMUNITY): Payer: Self-pay | Admitting: *Deleted

## 2019-06-09 NOTE — Progress Notes (Signed)
Jonathan Bass denies chest painor shortness of breath. Patient denies that he nor her family has experienced any of the following: Cough Fever >100.4 Runny Nose Sore Throat Difficulty breathing/ shortness of breath Travel in past 14 days- no

## 2019-06-10 ENCOUNTER — Inpatient Hospital Stay (HOSPITAL_COMMUNITY): Payer: 59 | Admitting: Certified Registered"

## 2019-06-10 ENCOUNTER — Encounter (HOSPITAL_COMMUNITY): Payer: Self-pay | Admitting: *Deleted

## 2019-06-10 ENCOUNTER — Other Ambulatory Visit: Payer: Self-pay

## 2019-06-10 ENCOUNTER — Encounter (HOSPITAL_COMMUNITY)
Admission: RE | Disposition: A | Discharge status: Home Health | Payer: Self-pay | Source: Home / Self Care | Attending: Orthopedic Surgery

## 2019-06-10 ENCOUNTER — Inpatient Hospital Stay (HOSPITAL_COMMUNITY): Payer: 59

## 2019-06-10 ENCOUNTER — Inpatient Hospital Stay (HOSPITAL_COMMUNITY)
Admission: RE | Admit: 2019-06-10 | Discharge: 2019-06-15 | DRG: 493 | Disposition: A | Discharge status: Home Health | Payer: 59 | Attending: Orthopedic Surgery | Admitting: Orthopedic Surgery

## 2019-06-10 DIAGNOSIS — S82871S Displaced pilon fracture of right tibia, sequela: Secondary | ICD-10-CM

## 2019-06-10 DIAGNOSIS — Z8249 Family history of ischemic heart disease and other diseases of the circulatory system: Secondary | ICD-10-CM | POA: Diagnosis not present

## 2019-06-10 DIAGNOSIS — Z7982 Long term (current) use of aspirin: Secondary | ICD-10-CM | POA: Diagnosis not present

## 2019-06-10 DIAGNOSIS — S82871C Displaced pilon fracture of right tibia, initial encounter for open fracture type IIIA, IIIB, or IIIC: Secondary | ICD-10-CM

## 2019-06-10 DIAGNOSIS — Z79899 Other long term (current) drug therapy: Secondary | ICD-10-CM | POA: Diagnosis not present

## 2019-06-10 DIAGNOSIS — S82301J Unspecified fracture of lower end of right tibia, subsequent encounter for open fracture type IIIA, IIIB, or IIIC with delayed healing: Secondary | ICD-10-CM

## 2019-06-10 DIAGNOSIS — B952 Enterococcus as the cause of diseases classified elsewhere: Secondary | ICD-10-CM | POA: Diagnosis present

## 2019-06-10 DIAGNOSIS — M86261 Subacute osteomyelitis, right tibia and fibula: Principal | ICD-10-CM

## 2019-06-10 DIAGNOSIS — W11XXXD Fall on and from ladder, subsequent encounter: Secondary | ICD-10-CM | POA: Diagnosis present

## 2019-06-10 DIAGNOSIS — Z803 Family history of malignant neoplasm of breast: Secondary | ICD-10-CM | POA: Diagnosis not present

## 2019-06-10 DIAGNOSIS — Z820 Family history of epilepsy and other diseases of the nervous system: Secondary | ICD-10-CM | POA: Diagnosis not present

## 2019-06-10 DIAGNOSIS — Z419 Encounter for procedure for purposes other than remedying health state, unspecified: Secondary | ICD-10-CM | POA: Diagnosis not present

## 2019-06-10 DIAGNOSIS — M86262 Subacute osteomyelitis, left tibia and fibula: Secondary | ICD-10-CM | POA: Diagnosis present

## 2019-06-10 DIAGNOSIS — Z833 Family history of diabetes mellitus: Secondary | ICD-10-CM | POA: Diagnosis not present

## 2019-06-10 DIAGNOSIS — Z978 Presence of other specified devices: Secondary | ICD-10-CM | POA: Diagnosis not present

## 2019-06-10 DIAGNOSIS — W19XXXS Unspecified fall, sequela: Secondary | ICD-10-CM | POA: Diagnosis not present

## 2019-06-10 DIAGNOSIS — Z1159 Encounter for screening for other viral diseases: Secondary | ICD-10-CM

## 2019-06-10 DIAGNOSIS — Z8042 Family history of malignant neoplasm of prostate: Secondary | ICD-10-CM | POA: Diagnosis not present

## 2019-06-10 DIAGNOSIS — S82871K Displaced pilon fracture of right tibia, subsequent encounter for closed fracture with nonunion: Secondary | ICD-10-CM | POA: Diagnosis not present

## 2019-06-10 DIAGNOSIS — F419 Anxiety disorder, unspecified: Secondary | ICD-10-CM | POA: Diagnosis present

## 2019-06-10 DIAGNOSIS — R911 Solitary pulmonary nodule: Secondary | ICD-10-CM | POA: Diagnosis not present

## 2019-06-10 DIAGNOSIS — K219 Gastro-esophageal reflux disease without esophagitis: Secondary | ICD-10-CM | POA: Diagnosis present

## 2019-06-10 DIAGNOSIS — B965 Pseudomonas (aeruginosa) (mallei) (pseudomallei) as the cause of diseases classified elsewhere: Secondary | ICD-10-CM | POA: Diagnosis not present

## 2019-06-10 DIAGNOSIS — M25571 Pain in right ankle and joints of right foot: Secondary | ICD-10-CM | POA: Diagnosis present

## 2019-06-10 DIAGNOSIS — T148XXA Other injury of unspecified body region, initial encounter: Secondary | ICD-10-CM

## 2019-06-10 DIAGNOSIS — B957 Other staphylococcus as the cause of diseases classified elsewhere: Secondary | ICD-10-CM | POA: Diagnosis not present

## 2019-06-10 DIAGNOSIS — S82871G Displaced pilon fracture of right tibia, subsequent encounter for closed fracture with delayed healing: Secondary | ICD-10-CM | POA: Diagnosis not present

## 2019-06-10 HISTORY — PX: APPLICATION OF WOUND VAC: SHX5189

## 2019-06-10 HISTORY — PX: OPEN REDUCTION INTERNAL FIXATION (ORIF) TIBIA/FIBULA FRACTURE: SHX5992

## 2019-06-10 LAB — SARS CORONAVIRUS 2 BY RT PCR (HOSPITAL ORDER, PERFORMED IN ~~LOC~~ HOSPITAL LAB): SARS Coronavirus 2: NEGATIVE

## 2019-06-10 LAB — HEMOGLOBIN: Hemoglobin: 14.7 g/dL (ref 13.0–17.0)

## 2019-06-10 SURGERY — OPEN REDUCTION INTERNAL FIXATION (ORIF) TIBIA/FIBULA FRACTURE
Anesthesia: General | Site: Ankle | Laterality: Right

## 2019-06-10 MED ORDER — 0.9 % SODIUM CHLORIDE (POUR BTL) OPTIME
TOPICAL | Status: DC | PRN
  Administered 2019-06-10: 1000 mL

## 2019-06-10 MED ORDER — DEXAMETHASONE SODIUM PHOSPHATE 10 MG/ML IJ SOLN
INTRAMUSCULAR | Status: AC
  Filled 2019-06-10: qty 1

## 2019-06-10 MED ORDER — ONDANSETRON HCL 4 MG/2ML IJ SOLN: 4.0000 mg | Freq: Four times a day (QID) | INTRAMUSCULAR | Status: DC | PRN

## 2019-06-10 MED ORDER — ASPIRIN EC 81 MG PO TBEC
81.0000 mg | DELAYED_RELEASE_TABLET | Freq: Every day | ORAL | Status: DC
  Administered 2019-06-11 – 2019-06-15 (×4): 81 mg via ORAL
  Filled 2019-06-10 (×4): qty 1

## 2019-06-10 MED ORDER — PROPOFOL 10 MG/ML IV BOLUS
INTRAVENOUS | Status: AC
  Filled 2019-06-10: qty 20

## 2019-06-10 MED ORDER — METHOCARBAMOL 500 MG PO TABS
500.0000 mg | ORAL_TABLET | Freq: Four times a day (QID) | ORAL | Status: DC | PRN
  Administered 2019-06-10 – 2019-06-11 (×3): 500 mg via ORAL
  Filled 2019-06-10 (×2): qty 1

## 2019-06-10 MED ORDER — LIDOCAINE 2% (20 MG/ML) 5 ML SYRINGE
INTRAMUSCULAR | Status: DC | PRN
  Administered 2019-06-10: 100 mg via INTRAVENOUS

## 2019-06-10 MED ORDER — CEFAZOLIN SODIUM-DEXTROSE 2-4 GM/100ML-% IV SOLN
2.0000 g | INTRAVENOUS | Status: AC
  Administered 2019-06-10: 2 g via INTRAVENOUS
  Filled 2019-06-10: qty 100

## 2019-06-10 MED ORDER — SERTRALINE HCL 50 MG PO TABS
150.0000 mg | ORAL_TABLET | Freq: Every day | ORAL | Status: DC
  Administered 2019-06-11 – 2019-06-15 (×5): 150 mg via ORAL
  Filled 2019-06-10 (×5): qty 1

## 2019-06-10 MED ORDER — HYDROMORPHONE HCL 1 MG/ML IJ SOLN
0.5000 mg | INTRAMUSCULAR | Status: DC | PRN
  Administered 2019-06-10 (×2): 0.5 mg via INTRAVENOUS

## 2019-06-10 MED ORDER — LIDOCAINE 2% (20 MG/ML) 5 ML SYRINGE
INTRAMUSCULAR | Status: AC
  Filled 2019-06-10: qty 5

## 2019-06-10 MED ORDER — MIDAZOLAM HCL 2 MG/2ML IJ SOLN
INTRAMUSCULAR | Status: AC
  Filled 2019-06-10: qty 2

## 2019-06-10 MED ORDER — METOCLOPRAMIDE HCL 5 MG/ML IJ SOLN: 5.0000 mg | Freq: Three times a day (TID) | INTRAMUSCULAR | Status: DC | PRN

## 2019-06-10 MED ORDER — PROPRANOLOL HCL 20 MG PO TABS
10.0000 mg | ORAL_TABLET | Freq: Two times a day (BID) | ORAL | Status: DC | PRN
  Administered 2019-06-14: 09:00:00 10 mg via ORAL
  Filled 2019-06-10: qty 1

## 2019-06-10 MED ORDER — POLYETHYLENE GLYCOL 3350 17 G PO PACK: 17.0000 g | PACK | Freq: Every day | ORAL | Status: DC | PRN

## 2019-06-10 MED ORDER — ACETAMINOPHEN 325 MG PO TABS: 325.0000 mg | ORAL_TABLET | Freq: Four times a day (QID) | ORAL | Status: DC | PRN

## 2019-06-10 MED ORDER — OXYCODONE HCL 5 MG PO TABS
10.0000 mg | ORAL_TABLET | ORAL | Status: DC | PRN
  Administered 2019-06-10: 16:00:00 10 mg via ORAL
  Administered 2019-06-10: 11:00:00 5 mg via ORAL
  Administered 2019-06-11 – 2019-06-13 (×3): 10 mg via ORAL
  Filled 2019-06-10 (×3): qty 2

## 2019-06-10 MED ORDER — EPHEDRINE SULFATE-NACL 50-0.9 MG/10ML-% IV SOSY
PREFILLED_SYRINGE | INTRAVENOUS | Status: DC | PRN
  Administered 2019-06-10 (×3): 10 mg via INTRAVENOUS

## 2019-06-10 MED ORDER — FENTANYL CITRATE (PF) 250 MCG/5ML IJ SOLN
INTRAMUSCULAR | Status: DC | PRN
  Administered 2019-06-10 (×5): 25 ug via INTRAVENOUS

## 2019-06-10 MED ORDER — ONDANSETRON HCL 4 MG PO TABS: 4.0000 mg | ORAL_TABLET | Freq: Four times a day (QID) | ORAL | Status: DC | PRN

## 2019-06-10 MED ORDER — DOCUSATE SODIUM 100 MG PO CAPS
100.0000 mg | ORAL_CAPSULE | Freq: Two times a day (BID) | ORAL | Status: DC
  Administered 2019-06-10 – 2019-06-11 (×4): 100 mg via ORAL
  Filled 2019-06-10 (×4): qty 1

## 2019-06-10 MED ORDER — LACTATED RINGERS IV SOLN
INTRAVENOUS | Status: DC
  Administered 2019-06-10: 07:00:00 via INTRAVENOUS

## 2019-06-10 MED ORDER — BISACODYL 10 MG RE SUPP: 10.0000 mg | Freq: Every day | RECTAL | Status: DC | PRN

## 2019-06-10 MED ORDER — OXYCODONE HCL 5 MG PO TABS
ORAL_TABLET | ORAL | Status: AC
  Administered 2019-06-10: 11:00:00 5 mg via ORAL
  Filled 2019-06-10: qty 1

## 2019-06-10 MED ORDER — CEFAZOLIN SODIUM-DEXTROSE 1-4 GM/50ML-% IV SOLN
1.0000 g | Freq: Four times a day (QID) | INTRAVENOUS | Status: DC
  Administered 2019-06-10 – 2019-06-11 (×4): 1 g via INTRAVENOUS
  Filled 2019-06-10 (×4): qty 50

## 2019-06-10 MED ORDER — ONDANSETRON HCL 4 MG/2ML IJ SOLN
INTRAMUSCULAR | Status: DC | PRN
  Administered 2019-06-10: 4 mg via INTRAVENOUS

## 2019-06-10 MED ORDER — SUCCINYLCHOLINE CHLORIDE 200 MG/10ML IV SOSY
PREFILLED_SYRINGE | INTRAVENOUS | Status: AC
  Filled 2019-06-10: qty 10

## 2019-06-10 MED ORDER — HYDROMORPHONE HCL 1 MG/ML IJ SOLN
INTRAMUSCULAR | Status: AC
  Administered 2019-06-10: 12:00:00 0.5 mg via INTRAVENOUS
  Filled 2019-06-10: qty 1

## 2019-06-10 MED ORDER — METHOCARBAMOL 1000 MG/10ML IJ SOLN
500.0000 mg | Freq: Four times a day (QID) | INTRAVENOUS | Status: DC | PRN
  Filled 2019-06-10: qty 5

## 2019-06-10 MED ORDER — FENTANYL CITRATE (PF) 250 MCG/5ML IJ SOLN
INTRAMUSCULAR | Status: AC
  Filled 2019-06-10: qty 5

## 2019-06-10 MED ORDER — DEXAMETHASONE SODIUM PHOSPHATE 10 MG/ML IJ SOLN
INTRAMUSCULAR | Status: DC | PRN
  Administered 2019-06-10: 5 mg via INTRAVENOUS

## 2019-06-10 MED ORDER — CHLORHEXIDINE GLUCONATE 4 % EX LIQD: 60.0000 mL | Freq: Once | CUTANEOUS | Status: DC

## 2019-06-10 MED ORDER — METOCLOPRAMIDE HCL 5 MG PO TABS: 5.0000 mg | ORAL_TABLET | Freq: Three times a day (TID) | ORAL | Status: DC | PRN

## 2019-06-10 MED ORDER — MAGNESIUM CITRATE PO SOLN: 1.0000 | Freq: Once | ORAL | Status: DC | PRN

## 2019-06-10 MED ORDER — MIDAZOLAM HCL 5 MG/5ML IJ SOLN
INTRAMUSCULAR | Status: DC | PRN
  Administered 2019-06-10: 2 mg via INTRAVENOUS

## 2019-06-10 MED ORDER — OXYCODONE HCL 5 MG PO TABS
5.0000 mg | ORAL_TABLET | ORAL | Status: DC | PRN
  Administered 2019-06-10 – 2019-06-13 (×10): 10 mg via ORAL
  Administered 2019-06-14: 09:00:00 5 mg via ORAL
  Filled 2019-06-10 (×2): qty 2
  Filled 2019-06-10: qty 1
  Filled 2019-06-10 (×7): qty 2
  Filled 2019-06-10: qty 1
  Filled 2019-06-10 (×2): qty 2

## 2019-06-10 MED ORDER — ENSURE PRE-SURGERY PO LIQD
296.0000 mL | Freq: Once | ORAL | Status: DC
  Filled 2019-06-10: qty 296

## 2019-06-10 MED ORDER — PROPOFOL 10 MG/ML IV BOLUS
INTRAVENOUS | Status: DC | PRN
  Administered 2019-06-10: 180 mg via INTRAVENOUS

## 2019-06-10 MED ORDER — METHOCARBAMOL 500 MG PO TABS
ORAL_TABLET | ORAL | Status: AC
  Administered 2019-06-10: 11:00:00 500 mg via ORAL
  Filled 2019-06-10: qty 1

## 2019-06-10 MED ORDER — ROCURONIUM BROMIDE 10 MG/ML (PF) SYRINGE
PREFILLED_SYRINGE | INTRAVENOUS | Status: AC
  Filled 2019-06-10: qty 10

## 2019-06-10 MED ORDER — POVIDONE-IODINE 10 % EX SWAB: 2.0000 "application " | Freq: Once | CUTANEOUS | Status: DC

## 2019-06-10 MED ORDER — GABAPENTIN 300 MG PO CAPS
300.0000 mg | ORAL_CAPSULE | Freq: Three times a day (TID) | ORAL | Status: DC
  Administered 2019-06-10 – 2019-06-15 (×14): 300 mg via ORAL
  Filled 2019-06-10 (×15): qty 1

## 2019-06-10 MED ORDER — SODIUM CHLORIDE 0.9 % IV SOLN
INTRAVENOUS | Status: DC
  Administered 2019-06-10: 14:00:00 via INTRAVENOUS

## 2019-06-10 SURGICAL SUPPLY — 68 items
BIT DRILL 2.5 X LONG (BIT) ×2
BIT DRILL X LONG 2.5 (BIT) ×2 IMPLANT
BLADE CLIPPER SURG (BLADE) IMPLANT
BLADE SAGITTAL (BLADE) ×2
BLADE SAW RECIP 87.9 MT (BLADE) ×4 IMPLANT
BLADE SAW THK.89X75X18XSGTL (BLADE) ×2 IMPLANT
BLADE SURG 10 STRL SS (BLADE) ×8 IMPLANT
BNDG COHESIVE 6X5 TAN STRL LF (GAUZE/BANDAGES/DRESSINGS) IMPLANT
BNDG GAUZE ELAST 4 BULKY (GAUZE/BANDAGES/DRESSINGS) ×4 IMPLANT
CLEANER TIP ELECTROSURG 2X2 (MISCELLANEOUS) ×4 IMPLANT
COVER MAYO STAND STRL (DRAPES) ×4 IMPLANT
COVER SURGICAL LIGHT HANDLE (MISCELLANEOUS) ×4 IMPLANT
COVER WAND RF STERILE (DRAPES) ×4 IMPLANT
CUFF TOURNIQUET SINGLE 34IN LL (TOURNIQUET CUFF) IMPLANT
CUFF TOURNIQUET SINGLE 44IN (TOURNIQUET CUFF) IMPLANT
DRAPE C-ARM 42X72 X-RAY (DRAPES) IMPLANT
DRAPE C-ARM MINI (DRAPES) ×4 IMPLANT
DRAPE INCISE IOBAN 66X45 STRL (DRAPES) ×8 IMPLANT
DRAPE U-SHAPE 47X51 STRL (DRAPES) ×4 IMPLANT
DRESSING PREVENA PLUS CUSTOM (GAUZE/BANDAGES/DRESSINGS) ×2 IMPLANT
DRILL BIT X LONG 2.5 (BIT) ×2
DRSG ADAPTIC 3X8 NADH LF (GAUZE/BANDAGES/DRESSINGS) ×4 IMPLANT
DRSG PAD ABDOMINAL 8X10 ST (GAUZE/BANDAGES/DRESSINGS) ×8 IMPLANT
DRSG PREVENA PLUS CUSTOM (GAUZE/BANDAGES/DRESSINGS) ×4
DURAPREP 26ML APPLICATOR (WOUND CARE) ×4 IMPLANT
ELECT REM PT RETURN 9FT ADLT (ELECTROSURGICAL) ×4
ELECTRODE REM PT RTRN 9FT ADLT (ELECTROSURGICAL) ×2 IMPLANT
EVACUATOR 1/8 PVC DRAIN (DRAIN) IMPLANT
GAUZE SPONGE 4X4 12PLY STRL (GAUZE/BANDAGES/DRESSINGS) ×8 IMPLANT
GLOVE BIOGEL PI IND STRL 9 (GLOVE) ×2 IMPLANT
GLOVE BIOGEL PI INDICATOR 9 (GLOVE) ×2
GLOVE SURG ORTHO 9.0 STRL STRW (GLOVE) ×4 IMPLANT
GOWN STRL REUS W/ TWL XL LVL3 (GOWN DISPOSABLE) ×6 IMPLANT
GOWN STRL REUS W/TWL XL LVL3 (GOWN DISPOSABLE) ×6
KIT BASIN OR (CUSTOM PROCEDURE TRAY) ×4 IMPLANT
KIT TURNOVER KIT B (KITS) ×4 IMPLANT
MANIFOLD NEPTUNE II (INSTRUMENTS) ×4 IMPLANT
NEEDLE 22X1 1/2 (OR ONLY) (NEEDLE) ×4 IMPLANT
NS IRRIG 1000ML POUR BTL (IV SOLUTION) ×4 IMPLANT
PACK ORTHO EXTREMITY (CUSTOM PROCEDURE TRAY) ×4 IMPLANT
PAD ARMBOARD 7.5X6 YLW CONV (MISCELLANEOUS) ×8 IMPLANT
PLATE LCP 3.5 7H 98 (Plate) ×4 IMPLANT
PUTTY DBX 5CC (Putty) ×4 IMPLANT
SCREW CORTEX 3.5 34MM (Screw) ×2 IMPLANT
SCREW CORTEX 3.5 36MM (Screw) ×2 IMPLANT
SCREW CORTEX 3.5 45MM (Screw) ×4 IMPLANT
SCREW CORTEX 3.5 50MM (Screw) ×12 IMPLANT
SCREW CORTEX 3.5 60MM (Screw) ×12 IMPLANT
SCREW LOCK CORT ST 3.5X34 (Screw) ×2 IMPLANT
SCREW LOCK CORT ST 3.5X36 (Screw) ×2 IMPLANT
SPONGE LAP 18X18 RF (DISPOSABLE) ×4 IMPLANT
SPONGE LAP 18X18 X RAY DECT (DISPOSABLE) ×4 IMPLANT
STAPLER VISISTAT 35W (STAPLE) IMPLANT
STOCKINETTE IMPERVIOUS LG (DRAPES) ×4 IMPLANT
SUCTION FRAZIER HANDLE 10FR (MISCELLANEOUS) ×2
SUCTION TUBE FRAZIER 10FR DISP (MISCELLANEOUS) ×2 IMPLANT
SUT ETHILON 2 0 PSLX (SUTURE) ×16 IMPLANT
SUT ETHILON 4 0 PS 2 18 (SUTURE) IMPLANT
SUT VIC AB 0 CTB1 27 (SUTURE) ×4 IMPLANT
SUT VIC AB 1 CTB1 27 (SUTURE) ×4 IMPLANT
SUT VIC AB 2-0 CTB1 (SUTURE) ×4 IMPLANT
SYR 20ML ECCENTRIC (SYRINGE) ×4 IMPLANT
TOWEL GREEN STERILE FF (TOWEL DISPOSABLE) ×4 IMPLANT
TOWEL OR 17X26 10 PK STRL BLUE (TOWEL DISPOSABLE) ×4 IMPLANT
TUBE CONNECTING 12'X1/4 (SUCTIONS) ×1
TUBE CONNECTING 12X1/4 (SUCTIONS) ×3 IMPLANT
WATER STERILE IRR 1000ML POUR (IV SOLUTION) ×8 IMPLANT
YANKAUER SUCT BULB TIP NO VENT (SUCTIONS) ×8 IMPLANT

## 2019-06-10 NOTE — Anesthesia Procedure Notes (Signed)
Procedure Name: LMA Insertion Date/Time: 06/10/2019 9:27 AM Performed by: Myna Bright, CRNA Pre-anesthesia Checklist: Patient identified, Emergency Drugs available, Suction available and Patient being monitored Patient Re-evaluated:Patient Re-evaluated prior to induction Oxygen Delivery Method: Circle system utilized Preoxygenation: Pre-oxygenation with 100% oxygen Induction Type: IV induction Ventilation: Mask ventilation without difficulty LMA: LMA inserted LMA Size: 4.0 Number of attempts: 1 Placement Confirmation: positive ETCO2 and breath sounds checked- equal and bilateral Tube secured with: Tape Dental Injury: Teeth and Oropharynx as per pre-operative assessment

## 2019-06-10 NOTE — Progress Notes (Signed)
Orthopedic Tech Progress Note Patient Details:  Jonathan Bass 1961-03-12 [id number] Pt states he has cam walker at home and family will bring it. Patient ID: Jonathan Bass, male   DOB: Oct 11, 1961, 58 y.o.   MRN: [id number]   Melony Overly T 06/10/2019, 1:14 PM

## 2019-06-10 NOTE — Anesthesia Preprocedure Evaluation (Addendum)
Anesthesia Evaluation  Patient identified by MRN, date of birth, ID band Patient awake    Reviewed: Allergy & Precautions, NPO status , Patient's Chart, lab work & pertinent test results  Airway Mallampati: II  TM Distance: >3 FB     Dental   Pulmonary    breath sounds clear to auscultation       Cardiovascular negative cardio ROS   Rhythm:Regular Rate:Normal     Neuro/Psych    GI/Hepatic Neg liver ROS, GERD  ,  Endo/Other  negative endocrine ROS  Renal/GU negative Renal ROS     Musculoskeletal   Abdominal   Peds  Hematology   Anesthesia Other Findings   Reproductive/Obstetrics                             Anesthesia Physical Anesthesia Plan  ASA: III  Anesthesia Plan: General   Post-op Pain Management:    Induction: Intravenous  PONV Risk Score and Plan: 2 and Ondansetron, Dexamethasone and Midazolam  Airway Management Planned: LMA  Additional Equipment:   Intra-op Plan:   Post-operative Plan: Extubation in OR  Informed Consent: I have reviewed the patients History and Physical, chart, labs and discussed the procedure including the risks, benefits and alternatives for the proposed anesthesia with the patient or authorized representative who has indicated his/her understanding and acceptance.     Dental advisory given  Plan Discussed with: Anesthesiologist, CRNA and Surgeon  Anesthesia Plan Comments:        Anesthesia Quick Evaluation

## 2019-06-10 NOTE — Plan of Care (Signed)
  Problem: Clinical Measurements: Goal: Ability to maintain clinical measurements within normal limits will improve Outcome: Progressing   Problem: Coping: Goal: Level of anxiety will decrease Outcome: Progressing   Problem: Elimination: Goal: Will not experience complications related to bowel motility Outcome: Progressing   Problem: Pain Managment: Goal: General experience of comfort will improve Outcome: Progressing   Problem: Safety: Goal: Ability to remain free from injury will improve Outcome: Progressing   Problem: Skin Integrity: Goal: Risk for impaired skin integrity will decrease Outcome: Progressing   

## 2019-06-10 NOTE — Anesthesia Postprocedure Evaluation (Signed)
Anesthesia Post Note  Patient: Jonathan Bass  Procedure(s) Performed: EXCISION RIGHT TIBIA AND FIBULA, WOUND DEBRIDEMENT, AND FIXATION OF TIBIA AND FIBULA (Right ) Application Of Wound Vac -Prevena (Right Ankle)     Patient location during evaluation: PACU Anesthesia Type: General Level of consciousness: awake Pain management: pain level controlled Vital Signs Assessment: post-procedure vital signs reviewed and stable    Last Vitals:  Vitals:   06/10/19 0643 06/10/19 1105  BP: 138/89   Pulse: 71   Resp: 18   Temp: 36.6 C (!) 36.2 C  SpO2: 98%     Last Pain:  Vitals:   06/10/19 1130  TempSrc:   PainSc: 7                  Hyatt Capobianco

## 2019-06-10 NOTE — Transfer of Care (Signed)
Immediate Anesthesia Transfer of Care Note  Patient: Jonathan Bass  Procedure(s) Performed: EXCISION RIGHT TIBIA AND FIBULA, WOUND DEBRIDEMENT, AND FIXATION OF TIBIA AND FIBULA (Right ) Application Of Wound Vac -Prevena (Right Ankle)  Patient Location: PACU  Anesthesia Type:General  Level of Consciousness: awake and oriented  Airway & Oxygen Therapy: Patient Spontanous Breathing and Patient connected to nasal cannula oxygen  Post-op Assessment: Report given to RN, Post -op Vital signs reviewed and stable and Patient moving all extremities  Post vital signs: Reviewed and stable  Last Vitals:  Vitals Value Taken Time  BP 122/80 06/10/19 1106  Temp    Pulse 86 06/10/19 1115  Resp 11 06/10/19 1115  SpO2 100 % 06/10/19 1115  Vitals shown include unvalidated device data.  Last Pain:  Vitals:   06/10/19 0658  TempSrc:   PainSc: 0-No pain      Patients Stated Pain Goal: 0 (49/20/10 0712)  Complications: No apparent anesthesia complications

## 2019-06-10 NOTE — Op Note (Signed)
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 Application of cancellus bone chips and demineralized bone matrix 10 cc. C-arm fluoroscopy to verify reduction.  SURGEON:  Nadara MustardMarcus V Duda, MD  PHYSICIAN ASSISTANT:None ANESTHESIA:   General  PREOPERATIVE INDICATIONS:  Jonathan Bass is a  58 y.o. male with a diagnosis of Open Right Tibia and Fibula Fracture who failed conservative measures and elected for surgical management.    The risks benefits and alternatives were discussed with the patient preoperatively including but not limited to the risks of infection, bleeding, nerve injury, cardiopulmonary complications, the need for revision surgery, among others, and the patient was willing to proceed.  OPERATIVE IMPLANTS: 7-hole LCP Synthes plate. 10 cc Synthes demineralized bone matrix with cancellus chips.  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  The necrotic soft tissue was sent for cultures.  OPERATIVE PROCEDURE: Patient was brought the operating room and underwent a general anesthetic.  After adequate levels anesthesia were obtained patient's right lower extremity was first prepped using a ChloraPrep scrub dried and then prepped using DuraPrep draped into a sterile field a timeout was called.  Patient received Kefzol preoperatively.  The necrotic traumatic wound anterior was ellipsed out to leave the wound 9 x 5 cm.  This tissue was sent for cultures.  This wound went directly to the fracture site.  Using an oscillating saw the distal tibia was resected approximately half a centimeter back to healthy viable bleeding petechial  bone.  The distal aspect of the tibial fracture was also resected approximately half a centimeter with an oscillating saw.  Osteotomes and curettes were used to complete the resection of the fibrous nonunion presumed infected bone.  This was irrigated with normal saline.  The soft tissue was resected back until there were healthy bleeding margins.  Attention was then focused laterally.  A posterior lateral incision was made this was carried down to the fibula.  The fibula nonunion fracture site was resected approximately 1 cm.  This was resected with an oscillating saw a curette and osteotome.  This was then also irrigated with normal saline.  The resected margins were then reduced.  Using a LCP plate laterally this was secured distally the osteotomy sites were reduced and then secured proximally on the fibula screws across the tibia to provide stabilization for the tibia without hardware being involved with the open traumatic wound.  3 screws were placed proximally and 3 screws placed distally.  C-arm fluoroscopy verified alignment in both AP and lateral planes.  After further irrigation the fracture site was packed with 10 cc of the demineralized bone matrix and cancellus chips.  Local tissue rearrangement was used to close the anterior wound that was 9 x 5 cm.  The lateral incision was closed also with 2-0 nylon.  Using the customizable Praveena wound VAC this was cut to essentially circumferentially wrap the ankle to cover all of the wounds and incisions.  There was an additional wound anterior and distal to the large excised wound and this was debrided back to healthy viable granulation tissue.  The wound VAC had a good suction fit radiograph showed stable alignment patient was extubated  taken to PACU in stable condition.  Chronic   DISCHARGE PLANNING:  Antibiotic duration will need to continue antibiotics for at least 6 weeks.  Weightbearing: Nonweightbearing on the right  Pain medication: Opioid  pathway  Dressing care/ Wound VAC: Continue wound VAC for 1 week  Ambulatory devices: Walker or crutches  Discharge to: Anticipate discharge to home once the cultures are finalized  Follow-up: In the office 1 week post operative.

## 2019-06-10 NOTE — H&P (Signed)
Jonathan Bass is an 58 y.o. male.   Chief Complaint: Right ankle pain with open wounds. HPI: Patient is a 58 year old gentleman who is status post an open distal tibia and fibular fracture on 03/11/2019 after falling off a ladder.  Patient had significant soft tissue injury and underwent multiple surgical interventions for debridement, closure and bony stabilization.  Patient presents at this time for evaluation for a necrotic wound over the traumatic wound on the right distal leg.  Past Medical History:  Diagnosis Date  . Anxiety   . GERD (gastroesophageal reflux disease)    02/21/2019- not current   . Pulmonary nodule 10/2014   Noted on CT scan -> stable and follow-up 2019.  No further evaluation necessary.    Past Surgical History:  Procedure Laterality Date  . CLOSED REDUCTION WRIST FRACTURE Right 03/11/2019   Procedure: CLOSED REDUCTION WRIST;  Surgeon: Jonathan LoftsHaddix, Kevin P, MD;  Location: [facility] OR;  Service: Orthopedics;  Laterality: Right;  . COLONOSCOPY    . EXTERNAL FIXATION LEG Right 03/11/2019   Procedure: EXTERNAL FIXATION LEG;  Surgeon: Jonathan LoftsHaddix, Kevin P, MD;  Location: [facility] OR;  Service: Orthopedics;  Laterality: Right;  . EXTERNAL FIXATION REMOVAL Right 05/08/2019   Procedure: REMOVAL EXTERNAL FIXATION ANKLE, PLACEMENT OF CAST;  Surgeon: Jonathan LoftsHaddix, Kevin P, MD;  Location: [facility] OR;  Service: Orthopedics;  Laterality: Right;  . FOOT SURGERY Bilateral    hammer toe  . HERNIA REPAIR Bilateral 2009   Inguinial  . I&D EXTREMITY Right 03/11/2019   Procedure: IRRIGATION AND DEBRIDEMENT EXTREMITY;  Surgeon: Jonathan LoftsHaddix, Kevin P, MD;  Location: [facility] OR;  Service: Orthopedics;  Laterality: Right;  . ORIF ANKLE FRACTURE Right 03/11/2019   Procedure: OPEN REDUCTION INTERNAL FIXATION (ORIF) ANKLE FRACTURE;  Surgeon: Jonathan LoftsHaddix, Kevin P, MD;  Location: [facility] OR;  Service: Orthopedics;  Laterality: Right;  . ORIF WRIST FRACTURE Right 03/11/2019   Procedure: OPEN REDUCTION INTERNAL FIXATION (ORIF) WRIST FRACTURE;  Surgeon:  Jonathan LoftsHaddix, Kevin P, MD;  Location: [facility] OR;  Service: Orthopedics;  Laterality: Right;  . TONSILLECTOMY      Family History  Problem Relation Age of Onset  . Prostate cancer Father   . Valvular heart disease Father        Had a heart valve replaced (unsure of details)  . Breast cancer Mother   . Parkinson's disease Mother   . Healthy Sister   . Healthy Brother   . Parkinson's disease Maternal Grandmother   . Diabetes Maternal Grandfather    Social History:  reports that he has never smoked. He has never used smokeless tobacco. He reports that he does not drink alcohol or use drugs.  Allergies: No Known Allergies  Medications Prior to Admission  Medication Sig Dispense Refill  . acetaminophen (TYLENOL) 325 MG tablet Take 650 mg by mouth 2 (two) times daily as needed (pain.).    Marland Kitchen. aspirin EC 81 MG tablet Take 81 mg by mouth daily.    . Cholecalciferol (VITAMIN D3) 125 MCG (5000 UT) TABS Take 1 tablet (5,000 Units total) by mouth daily. 30 tablet 2  . gabapentin (NEURONTIN) 300 MG capsule Take 1 capsule (300 mg total) by mouth 3 (three) times daily. 90 capsule 0  . HYDROcodone-acetaminophen (NORCO/VICODIN) 5-325 MG tablet Take 1 tablet by mouth 3 (three) times daily. Scheduled    . hydrocortisone cream 1 % Apply 1 application topically 2 (two) times daily as needed for itching (irritated skin.).    Marland Kitchen. ibuprofen (ADVIL) 600 MG tablet Take 600 mg  by mouth every 8 (eight) hours as needed (for pain.).    Marland Kitchen propranolol (INDERAL) 10 MG tablet Take 10 mg by mouth 2 (two) times daily as needed (anxiety).     . sertraline (ZOLOFT) 100 MG tablet Take 150 mg by mouth daily.     Marland Kitchen sulfamethoxazole-trimethoprim (BACTRIM DS) 800-160 MG tablet Take 1 tablet by mouth 2 (two) times daily. Med to be completed 06/11/2019    . vitamin C (VITAMIN C) 500 MG tablet Take 1 tablet (500 mg total) by mouth daily. 60 tablet 0    No results found for this or any previous visit (from the past 48 hour(s)). No results  found.  Review of Systems  All other systems reviewed and are negative.   Blood pressure 138/89, pulse 71, temperature 97.8 F (36.6 C), temperature source Oral, resp. rate 18, height 5\' 11"  (1.803 m), weight 86.2 kg, SpO2 98 %. Physical Exam  Patient is alert, oriented, no adenopathy, well-dressed, normal affect, normal respiratory effort. Examination the patient's right lower extremity is in a cast with a window over the traumatic wounds.  Patient's foot is neurovascular intact he has good warmth good capillary refill I cannot palpate a pulse due to the cast.  The necrotic wound is unroofed and this shows an area of approximately half a centimeter in diameter and the length of the traumatic wound that has necrotic tissue that extends all the way down to the nonunion fracture site.  There is no cellulitis no odor no drainage.  Patient does have venous stasis swelling with the right lower extremity much larger than the left lower extremity.  He has good hair growth on the left lower extremity. Assessment/Plan 1. 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    Recommended resecting the necrotic tissue from the open fracture wound and  resect approximately a centimeter of the tibia and fibula to get back to healthy bone, this chronic wound may be due to a infection within the bone.  And then immobilization with either casting or external fixator.  With the goal to be able to close the soft tissue envelope.  Risk and benefits were discussed including additional surgery recurrent infection nonhealing of the wound.  Patient states he understands wished to proceed at this time.    Jonathan Minion, MD 06/10/2019, 7:27 AM

## 2019-06-11 DIAGNOSIS — R911 Solitary pulmonary nodule: Secondary | ICD-10-CM

## 2019-06-11 DIAGNOSIS — S82871K Displaced pilon fracture of right tibia, subsequent encounter for closed fracture with nonunion: Secondary | ICD-10-CM

## 2019-06-11 LAB — ACID FAST SMEAR (AFB, MYCOBACTERIA): Acid Fast Smear: NEGATIVE

## 2019-06-11 LAB — BASIC METABOLIC PANEL
Anion gap: 10 (ref 5–15)
BUN: 12 mg/dL (ref 6–20)
CO2: 24 mmol/L (ref 22–32)
Calcium: 8.9 mg/dL (ref 8.9–10.3)
Chloride: 105 mmol/L (ref 98–111)
Creatinine, Ser: 1.18 mg/dL (ref 0.61–1.24)
GFR calc Af Amer: >60 mL/min (ref >60)
GFR calc non Af Amer: >60 mL/min (ref >60)
Glucose, Bld: 90 mg/dL (ref 70–99)
Potassium: 3.9 mmol/L (ref 3.5–5.1)
Sodium: 139 mmol/L (ref 135–145)

## 2019-06-11 MED ORDER — POVIDONE-IODINE 10 % EX SWAB: 2.0000 "application " | Freq: Once | CUTANEOUS | Status: DC

## 2019-06-11 MED ORDER — RIFAMPIN 300 MG PO CAPS
300.0000 mg | ORAL_CAPSULE | Freq: Two times a day (BID) | ORAL | Status: DC
  Administered 2019-06-11 – 2019-06-15 (×8): 300 mg via ORAL
  Filled 2019-06-11 (×10): qty 1

## 2019-06-11 MED ORDER — ENSURE PRE-SURGERY PO LIQD
296.0000 mL | Freq: Once | ORAL | Status: DC
  Administered 2019-06-12: 296 mL via ORAL

## 2019-06-11 MED ORDER — ENSURE PRE-SURGERY PO LIQD
296.0000 mL | Freq: Once | ORAL | Status: DC
  Filled 2019-06-11: qty 296

## 2019-06-11 MED ORDER — SODIUM CHLORIDE 0.9 % IV SOLN
2.0000 g | Freq: Every day | INTRAVENOUS | Status: DC
  Administered 2019-06-11 – 2019-06-14 (×4): 2 g via INTRAVENOUS
  Filled 2019-06-11 (×4): qty 20

## 2019-06-11 MED ORDER — CEFAZOLIN SODIUM-DEXTROSE 2-4 GM/100ML-% IV SOLN: 2.0000 g | INTRAVENOUS | Status: DC

## 2019-06-11 MED ORDER — VANCOMYCIN HCL IN DEXTROSE 1-5 GM/200ML-% IV SOLN
1000.0000 mg | Freq: Two times a day (BID) | INTRAVENOUS | Status: DC
  Administered 2019-06-12 – 2019-06-15 (×7): 1000 mg via INTRAVENOUS
  Filled 2019-06-11 (×9): qty 200

## 2019-06-11 MED ORDER — VANCOMYCIN HCL 10 G IV SOLR
1500.0000 mg | Freq: Once | INTRAVENOUS | Status: AC
  Administered 2019-06-11: 13:00:00 1500 mg via INTRAVENOUS
  Filled 2019-06-11: qty 1500

## 2019-06-11 MED ORDER — CHLORHEXIDINE GLUCONATE 4 % EX LIQD
60.0000 mL | CUTANEOUS | Status: AC
  Administered 2019-06-12: 4 via TOPICAL
  Filled 2019-06-11: qty 60

## 2019-06-11 MED ORDER — VANCOMYCIN HCL IN DEXTROSE 1-5 GM/200ML-% IV SOLN: 1000.0000 mg | Freq: Two times a day (BID) | INTRAVENOUS | Status: DC

## 2019-06-11 MED ORDER — POVIDONE-IODINE 10 % EX SWAB: 2.0000 "application " | CUTANEOUS | Status: DC

## 2019-06-11 MED ORDER — RIFAMPIN 300 MG PO CAPS
300.0000 mg | ORAL_CAPSULE | Freq: Two times a day (BID) | ORAL | Status: DC
  Filled 2019-06-11: qty 1

## 2019-06-11 MED ORDER — CHLORHEXIDINE GLUCONATE 4 % EX LIQD: 60.0000 mL | Freq: Once | CUTANEOUS | Status: DC

## 2019-06-11 MED ORDER — CEFAZOLIN SODIUM-DEXTROSE 2-4 GM/100ML-% IV SOLN
2.0000 g | INTRAVENOUS | Status: DC
  Filled 2019-06-11: qty 100

## 2019-06-11 NOTE — Progress Notes (Signed)
Orthopedic Tech Progress Note Patient Details:  [name] 06/04/1961 [id number] RN called this morning asking me to bring up a CAM WALKER. The patient does have one but its really heavy on the patient. So he asked could he feel the weight of boot and decided he would take ours.  Ortho Devices Type of Ortho Device: CAM walker Ortho Device/Splint Location: LRE Ortho Device/Splint Interventions: Adjustment, Application, Ordered   Post Interventions Patient Tolerated: Well Instructions Provided: Care of device, Adjustment of device   Janit Pagan 06/11/2019, 1:28 PM

## 2019-06-11 NOTE — Progress Notes (Signed)
Pharmacy Antibiotic Note  Jonathan Bass is a 58 y.o. male admitted on 06/10/2019 with necrotic wound over previous open fracture site of right tibia/fibula.  Pharmacy has been consulted for vancomycin dosing. Patient is s/p debridement in the OR on 6/24. Infected tissue was noted to penetrate to bone.   Plan: Vancomycin 1500 mg X 1  Then 1000 mg every 12 hours  Predicted AUC 509 with SCr- 1.18 Also on rifampin and ceftriaxone due to open fracture and associated hardware   Height: 5\' 11"  (180.3 cm) Weight: 190 lb (86.2 kg) IBW/kg (Calculated) : 75.3  Temp (24hrs), Avg:97.4 F (36.3 C), Min:97 F (36.1 C), Max:97.6 F (36.4 C)   No Known Allergies  Antimicrobials this admission: Ancef 6/24>>6/25 Vanc 6/25>>  Microbiology results: 6/24 wound cx: GPC on gm stain   Thank you for allowing pharmacy to be a part of this patient's care.  Jimmy Footman, PharmD, BCPS, BCIDP Infectious Diseases Clinical Pharmacist Phone: [phone] 06/11/2019 11:26 AM

## 2019-06-11 NOTE — Consult Note (Signed)
[facility]    Date of Admission:  06/10/2019     Total days of antibiotics 2               Reason for Consult: Wound infection / Osteomyelitis    Referring Provider: Lajoyce Cornersuda Primary Care Provider: Juluis RainierBarnes, Elizabeth, MD   Assessment/Plan:  Jonathan Bass is a 58 year old male admitted with subacute osteomyelitis s/p excision and fixation of the right tibia/fibula after sustaining a fall from a ladder at the end of March. Surgical specimens with gram stains showing gram positive cocci. Currently on Day 2 of Ancef.   Subacute osteomyelitis of the right tibia - POD #1 with wound vac in place. Surgical cultures remain pending. Recommend expanding spectrum of antibiotics to vancomycin and ceftraixone. Given new hardware will also add rifampin. Discussed plan of care including PICC line with likely need of 6 weeks of antibiotic therapy. Dr. Lajoyce Cornersuda with plans to return to OR tomorrow. Continue to monitor cultures and fever curve.    Active Problems:   Displaced pilon fracture of right tibia, initial encounter for open fracture type IIIA, IIIB, or IIIC   Subacute osteomyelitis of right tibia [facility](HCC)   Surgery, elective   Pilon fracture of right tibia, sequela    aspirin EC  81 mg Oral Daily   docusate sodium  100 mg Oral BID   gabapentin  300 mg Oral TID   sertraline  150 mg Oral Daily     HPI: Jonathan Bass is a 58 y.o. male with previous medical history of pulmonary nodule admitted with subacute osteomyelitis and non-union of previous traumatic fracture.   Jonathan Bass fell off a ladder in March 2020 sustaining a Type III open right pilon fracture and right intra-articular distal radius fracture and underwent I&D and external fixation of the ankle. During initial surgery 1 g of vancomycin and 1.2 g of tobramycin powder were placed in the lacerations. Dr. Melvyn Novasrtmann performed open treatment of the right wrist intra-articular distal radius fracture with a distal  radius plate with combination of pegs and screws. Discharged on 03/14/19 to home with uncomplicated hospital course and received Bactrim at discharge for 7 days. On follow up, the skin around the laceration had not resolved significantly enough to allow for definitive fixation with incision. It was decided on 05/08/19 to proceed with external fixation for approximately 8 weeks then removal with transition to a cast. Dr. Jena GaussHaddix performed removal of external fixation with some motion still present at the site and placement of a short leg cast with anterior wedge. On 06/08/19 Jonathan Bass was seen by Dr. Lajoyce Cornersuda for evaluation of necrotic wound over the traumatic wound on the right distal leg. Necrotic soft tissue extended down to the nonunion of the tibia. Dr. Lajoyce Cornersuda performed an excision of the right tibia and fibula, wound debridement and fixation of the tibia and fibula. Wound tracked directly down to the fracture site. Cultures were obtained from tissue and bone with concern for bone infection.   Jonathan Bass has been on several courses of Bactrim over the course of his treatment with 7 day courses starting on 3/31, 5/8, 6/10 and 6/18. His last dose of Bactrim was taken on 06/09/19 prior to admission. Surgical specimen with gram stain showing gram positive cocci. Afebrile since admission and currently on Day 2 of Ancef.   Noted some increasing drainage from his wound when he was placed in the cast and it was removed as well  as when he was in the removable boot. May have had some cold sweats at times but does not recall having a measurable fever. t   Review of Systems: Review of Systems  Constitutional: Negative for chills, fever and weight loss.  Respiratory: Negative for cough, shortness of breath and wheezing.   Cardiovascular: Negative for chest pain and leg swelling.  Gastrointestinal: Negative for abdominal pain, constipation, diarrhea, nausea and vomiting.  Skin: Negative for rash.     Past Medical  History:  Diagnosis Date   Anxiety    GERD (gastroesophageal reflux disease)    02/21/2019- not current    Pulmonary nodule 10/2014   Noted on CT scan -> stable and follow-up 2019.  No further evaluation necessary.    Social History   Tobacco Use   Smoking status: Never Smoker   Smokeless tobacco: Never Used  Substance Use Topics   Alcohol use: No   Drug use: No    Family History  Problem Relation Age of Onset   Prostate cancer Father    Valvular heart disease Father        Had a heart valve replaced (unsure of details)   Breast cancer Mother    Parkinson's disease Mother    Healthy Sister    Healthy Brother    Parkinson's disease Maternal Grandmother    Diabetes Maternal Grandfather     No Known Allergies  OBJECTIVE: Blood pressure 114/70, pulse 72, temperature (!) 97.5 F (36.4 C), temperature source Oral, resp. rate 17, height 5\' 11"  (1.803 m), weight 86.2 kg, SpO2 99 %.  Physical Exam Constitutional:      General: He is not in acute distress.    Appearance: He is well-developed.  Cardiovascular:     Rate and Rhythm: Normal rate and regular rhythm.     Heart sounds: Normal heart sounds.  Pulmonary:     Effort: Pulmonary effort is normal.     Breath sounds: Normal breath sounds.  Musculoskeletal:     Comments: Wound vac on right distal lower extremity patent and with sanguinous drainage in the canister.   Skin:    General: Skin is warm and dry.  Neurological:     Mental Status: He is alert and oriented to person, place, and time.  Psychiatric:        Mood and Affect: Mood normal.     Lab Results Lab Results  Component Value Date   WBC 6.4 05/08/2019   HGB 14.7 06/10/2019   HCT 41.9 05/08/2019   MCV 90.9 05/08/2019   PLT 238 05/08/2019    Lab Results  Component Value Date   CREATININE 1.09 05/08/2019   BUN 22 (H) 05/08/2019   NA 140 05/08/2019   K 3.9 05/08/2019   CL 106 05/08/2019   CO2 23 05/08/2019    Lab Results    Component Value Date   ALT 22 03/11/2019   AST 27 03/11/2019   ALKPHOS 65 03/11/2019   BILITOT 0.5 03/11/2019     Microbiology: Recent Results (from the past 240 hour(s))  SARS Coronavirus 2 (CEPHEID - Performed in [place] hospital lab), Hosp Order     Status: None   Collection Time: 06/10/19  6:50 AM   Specimen: Nasopharyngeal Swab  Result Value Ref Range Status   SARS Coronavirus 2 NEGATIVE NEGATIVE Final    Comment: (NOTE) If result is NEGATIVE SARS-CoV-2 target nucleic acids are NOT DETECTED. The SARS-CoV-2 RNA is generally detectable in upper and lower  respiratory specimens during  the acute phase of infection. The lowest  concentration of SARS-CoV-2 viral copies this assay can detect is 250  copies / mL. A negative result does not preclude SARS-CoV-2 infection  and should not be used as the sole basis for treatment or other  patient management decisions.  A negative result may occur with  improper specimen collection / handling, submission of specimen other  than nasopharyngeal swab, presence of viral mutation(s) within the  areas targeted by this assay, and inadequate number of viral copies  (<250 copies / mL). A negative result must be combined with clinical  observations, patient history, and epidemiological information. If result is POSITIVE SARS-CoV-2 target nucleic acids are DETECTED. The SARS-CoV-2 RNA is generally detectable in upper and lower  respiratory specimens dur ing the acute phase of infection.  Positive  results are indicative of active infection with SARS-CoV-2.  Clinical  correlation with patient history and other diagnostic information is  necessary to determine patient infection status.  Positive results do  not rule out bacterial infection or co-infection with other viruses. If result is PRESUMPTIVE POSTIVE SARS-CoV-2 nucleic acids MAY BE PRESENT.   A presumptive positive result was obtained on the submitted specimen  and confirmed on repeat  testing.  While 2019 novel coronavirus  (SARS-CoV-2) nucleic acids may be present in the submitted sample  additional confirmatory testing may be necessary for epidemiological  and / or clinical management purposes  to differentiate between  SARS-CoV-2 and other Sarbecovirus currently known to infect humans.  If clinically indicated additional testing with an alternate test  methodology [phone]([id number]) is advised. The SARS-CoV-2 RNA is generally  detectable in upper and lower respiratory sp ecimens during the acute  phase of infection. The expected result is Negative. Fact Sheet for Patients:  BoilerBrush.com.cyhttps://www.fda.gov/media/136312/download Fact Sheet for Healthcare Providers: https://pope.com/https://www.fda.gov/media/136313/download This test is not yet approved or cleared by the [place] FDA and has been authorized for detection and/or diagnosis of SARS-CoV-2 by FDA under an Emergency Use Authorization (EUA).  This EUA will remain in effect (meaning this test can be used) for the duration of the COVID-19 declaration under Section 564(b)(1) of the Act, 21 U.S.C. section 360bbb-3(b)(1), unless the authorization is terminated or revoked sooner. Performed at [facility], [address] [place], [place] [address]   Aerobic/Anaerobic Culture (surgical/deep wound)     Status: None (Preliminary result)   Collection Time: 06/10/19  9:47 AM   Specimen: Soft Tissue, Other  Result Value Ref Range Status   Specimen Description TISSUE RIGHT ANKLE  Final   Special Requests PATIENT ON FOLLOWING ANCEF  Final   Gram Stain   Final    RARE WBC PRESENT, PREDOMINANTLY PMN RARE GRAM POSITIVE COCCI Performed at [facility], [address] [place], [place] [address]    Culture PENDING  Incomplete   Report Status PENDING  Incomplete     Marcos EkeGreg Wadsworth Skolnick, NP [facility] [phone] Pager  06/11/2019  10:52 AM

## 2019-06-11 NOTE — Plan of Care (Signed)
  Problem: Education: Goal: Knowledge of General Education information will improve Description: Including pain rating scale, medication(s)/side effects and non-pharmacologic comfort measures Outcome: Progressing   Problem: Clinical Measurements: Goal: Diagnostic test results will improve Outcome: Progressing   Problem: Activity: Goal: Risk for activity intolerance will decrease Outcome: Progressing   Problem: Nutrition: Goal: Adequate nutrition will be maintained Outcome: Progressing   Problem: Coping: Goal: Level of anxiety will decrease Outcome: Progressing   Problem: Pain Managment: Goal: General experience of comfort will improve Outcome: Progressing

## 2019-06-11 NOTE — Progress Notes (Signed)
PT Cancellation Note  Patient Details Name: Jonathan Bass MRN: [id number] DOB: 05/01/1961   Cancelled Treatment:    Reason Eval/Treat Not Completed: Other (comment) Per Dr. Sharol Given, patient must have walking boot donned for OOB activity. Patient has cam boot at home that he will have brought here. Hold eval for today due to lack of equipment.     [name] 06/11/2019, 12:07 PM

## 2019-06-11 NOTE — Progress Notes (Signed)
Patient ID: Jonathan Bass, male   DOB: [birth year], 58 y.o.   MRN: [id number] Postoperative day 1 excision of infected necrotic tissue open pilon fracture with Gram stain's positive for gram-positive cocci.  There is 75 cc in the wound VAC canister.  I am concerned that the tissue cultures are positive and will plan for repeat irrigation and debridement of the fusion site with taking down the hardware and repeating the debridement and revising the fusion.  Will plan for infectious disease consultation for 6 weeks of IV antibiotics.

## 2019-06-12 ENCOUNTER — Encounter (HOSPITAL_COMMUNITY)
Admission: RE | Disposition: A | Discharge status: Home Health | Payer: Self-pay | Source: Home / Self Care | Attending: Orthopedic Surgery

## 2019-06-12 ENCOUNTER — Inpatient Hospital Stay (HOSPITAL_COMMUNITY): Payer: 59 | Admitting: Anesthesiology

## 2019-06-12 ENCOUNTER — Inpatient Hospital Stay: Payer: Self-pay

## 2019-06-12 ENCOUNTER — Encounter (HOSPITAL_COMMUNITY): Payer: Self-pay | Admitting: Orthopedic Surgery

## 2019-06-12 DIAGNOSIS — Z978 Presence of other specified devices: Secondary | ICD-10-CM

## 2019-06-12 DIAGNOSIS — W11XXXD Fall on and from ladder, subsequent encounter: Secondary | ICD-10-CM

## 2019-06-12 DIAGNOSIS — M86261 Subacute osteomyelitis, right tibia and fibula: Principal | ICD-10-CM

## 2019-06-12 DIAGNOSIS — S82871G Displaced pilon fracture of right tibia, subsequent encounter for closed fracture with delayed healing: Secondary | ICD-10-CM

## 2019-06-12 HISTORY — PX: OPEN REDUCTION INTERNAL FIXATION (ORIF) TIBIA/FIBULA FRACTURE: SHX5992

## 2019-06-12 SURGERY — OPEN REDUCTION INTERNAL FIXATION (ORIF) TIBIA/FIBULA FRACTURE
Anesthesia: General | Site: Leg Lower | Laterality: Right

## 2019-06-12 MED ORDER — SODIUM CHLORIDE 0.9 % IV SOLN
INTRAVENOUS | Status: DC
  Administered 2019-06-15: 02:00:00 via INTRAVENOUS

## 2019-06-12 MED ORDER — LACTATED RINGERS IV SOLN
INTRAVENOUS | Status: DC | PRN
  Administered 2019-06-12: 14:00:00 via INTRAVENOUS

## 2019-06-12 MED ORDER — ONDANSETRON HCL 4 MG PO TABS: 4.0000 mg | ORAL_TABLET | Freq: Four times a day (QID) | ORAL | Status: DC | PRN

## 2019-06-12 MED ORDER — POLYETHYLENE GLYCOL 3350 17 G PO PACK: 17.0000 g | PACK | Freq: Every day | ORAL | Status: DC | PRN

## 2019-06-12 MED ORDER — DOCUSATE SODIUM 100 MG PO CAPS
100.0000 mg | ORAL_CAPSULE | Freq: Two times a day (BID) | ORAL | Status: DC
  Administered 2019-06-12 – 2019-06-15 (×6): 100 mg via ORAL
  Filled 2019-06-12 (×6): qty 1

## 2019-06-12 MED ORDER — METOCLOPRAMIDE HCL 5 MG/ML IJ SOLN: 5.0000 mg | Freq: Three times a day (TID) | INTRAMUSCULAR | Status: DC | PRN

## 2019-06-12 MED ORDER — LIDOCAINE 2% (20 MG/ML) 5 ML SYRINGE
INTRAMUSCULAR | Status: DC | PRN
  Administered 2019-06-12: 100 mg via INTRAVENOUS

## 2019-06-12 MED ORDER — METOCLOPRAMIDE HCL 5 MG PO TABS: 5.0000 mg | ORAL_TABLET | Freq: Three times a day (TID) | ORAL | Status: DC | PRN

## 2019-06-12 MED ORDER — SODIUM CHLORIDE 0.9 % IR SOLN
Status: DC | PRN
  Administered 2019-06-12: 3000 mL
  Administered 2019-06-12: 1000 mL

## 2019-06-12 MED ORDER — PROPOFOL 10 MG/ML IV BOLUS
INTRAVENOUS | Status: DC | PRN
  Administered 2019-06-12: 30 mg via INTRAVENOUS
  Administered 2019-06-12: 20 mg via INTRAVENOUS
  Administered 2019-06-12: 200 mg via INTRAVENOUS

## 2019-06-12 MED ORDER — ONDANSETRON HCL 4 MG/2ML IJ SOLN
INTRAMUSCULAR | Status: DC | PRN
  Administered 2019-06-12: 4 mg via INTRAVENOUS

## 2019-06-12 MED ORDER — METHOCARBAMOL 500 MG PO TABS: 500.0000 mg | ORAL_TABLET | Freq: Four times a day (QID) | ORAL | Status: DC | PRN

## 2019-06-12 MED ORDER — ONDANSETRON HCL 4 MG/2ML IJ SOLN
INTRAMUSCULAR | Status: AC
  Filled 2019-06-12: qty 2

## 2019-06-12 MED ORDER — CEFAZOLIN SODIUM-DEXTROSE 2-3 GM-%(50ML) IV SOLR
INTRAVENOUS | Status: DC | PRN
  Administered 2019-06-12: 2 g via INTRAVENOUS

## 2019-06-12 MED ORDER — FENTANYL CITRATE (PF) 250 MCG/5ML IJ SOLN
INTRAMUSCULAR | Status: DC | PRN
  Administered 2019-06-12 (×2): 50 ug via INTRAVENOUS
  Administered 2019-06-12 (×2): 25 ug via INTRAVENOUS
  Administered 2019-06-12: 50 ug via INTRAVENOUS

## 2019-06-12 MED ORDER — SODIUM CHLORIDE (PF) 0.9 % IJ SOLN
INTRAMUSCULAR | Status: DC | PRN
  Administered 2019-06-12: 4 mL

## 2019-06-12 MED ORDER — MAGNESIUM CITRATE PO SOLN: 1.0000 | Freq: Once | ORAL | Status: DC | PRN

## 2019-06-12 MED ORDER — OXYCODONE HCL 5 MG PO TABS
5.0000 mg | ORAL_TABLET | ORAL | Status: DC | PRN
  Administered 2019-06-12: 10 mg via ORAL
  Administered 2019-06-13: 16:00:00 5 mg via ORAL
  Administered 2019-06-13: 10 mg via ORAL
  Filled 2019-06-12 (×2): qty 2
  Filled 2019-06-12: qty 1

## 2019-06-12 MED ORDER — LACTATED RINGERS IV SOLN
INTRAVENOUS | Status: DC
  Administered 2019-06-12: 12:00:00 via INTRAVENOUS

## 2019-06-12 MED ORDER — PROMETHAZINE HCL 25 MG/ML IJ SOLN: 6.2500 mg | INTRAMUSCULAR | Status: DC | PRN

## 2019-06-12 MED ORDER — METHOCARBAMOL 1000 MG/10ML IJ SOLN
500.0000 mg | Freq: Four times a day (QID) | INTRAVENOUS | Status: DC | PRN
  Filled 2019-06-12: qty 5

## 2019-06-12 MED ORDER — ONDANSETRON HCL 4 MG/2ML IJ SOLN: 4.0000 mg | Freq: Four times a day (QID) | INTRAMUSCULAR | Status: DC | PRN

## 2019-06-12 MED ORDER — MIDAZOLAM HCL 2 MG/2ML IJ SOLN
INTRAMUSCULAR | Status: AC
  Filled 2019-06-12: qty 2

## 2019-06-12 MED ORDER — OXYCODONE HCL 5 MG PO TABS: 10.0000 mg | ORAL_TABLET | ORAL | Status: DC | PRN

## 2019-06-12 MED ORDER — EPHEDRINE SULFATE 50 MG/ML IJ SOLN
INTRAMUSCULAR | Status: DC | PRN
  Administered 2019-06-12: 5 mg via INTRAVENOUS

## 2019-06-12 MED ORDER — DEXAMETHASONE SODIUM PHOSPHATE 10 MG/ML IJ SOLN
INTRAMUSCULAR | Status: DC | PRN
  Administered 2019-06-12: 10 mg via INTRAVENOUS

## 2019-06-12 MED ORDER — BISACODYL 10 MG RE SUPP
10.0000 mg | Freq: Every day | RECTAL | Status: DC | PRN
  Administered 2019-06-14 – 2019-06-15 (×2): 10 mg via RECTAL
  Filled 2019-06-12 (×2): qty 1

## 2019-06-12 MED ORDER — PHENYLEPHRINE HCL (PRESSORS) 10 MG/ML IV SOLN
INTRAVENOUS | Status: DC | PRN
  Administered 2019-06-12 (×2): 80 ug via INTRAVENOUS

## 2019-06-12 MED ORDER — OXYCODONE HCL 5 MG PO TABS
ORAL_TABLET | ORAL | Status: AC
  Filled 2019-06-12: qty 2

## 2019-06-12 MED ORDER — KETOROLAC TROMETHAMINE 30 MG/ML IJ SOLN
30.0000 mg | Freq: Once | INTRAMUSCULAR | Status: AC | PRN
  Administered 2019-06-12: 30 mg via INTRAVENOUS

## 2019-06-12 MED ORDER — FENTANYL CITRATE (PF) 100 MCG/2ML IJ SOLN: 25.0000 ug | INTRAMUSCULAR | Status: DC | PRN

## 2019-06-12 MED ORDER — OXYCODONE HCL 5 MG/5ML PO SOLN: 5.0000 mg | Freq: Once | ORAL | Status: DC | PRN

## 2019-06-12 MED ORDER — HYDROMORPHONE HCL 1 MG/ML IJ SOLN: 0.5000 mg | INTRAMUSCULAR | Status: DC | PRN

## 2019-06-12 MED ORDER — KETOROLAC TROMETHAMINE 30 MG/ML IJ SOLN
INTRAMUSCULAR | Status: AC
  Filled 2019-06-12: qty 1

## 2019-06-12 MED ORDER — OXYCODONE HCL 5 MG PO TABS: 5.0000 mg | ORAL_TABLET | Freq: Once | ORAL | Status: DC | PRN

## 2019-06-12 MED ORDER — PROPOFOL 10 MG/ML IV BOLUS
INTRAVENOUS | Status: AC
  Filled 2019-06-12: qty 20

## 2019-06-12 MED ORDER — FENTANYL CITRATE (PF) 250 MCG/5ML IJ SOLN
INTRAMUSCULAR | Status: AC
  Filled 2019-06-12: qty 5

## 2019-06-12 MED ORDER — ACETAMINOPHEN 325 MG PO TABS: 325.0000 mg | ORAL_TABLET | Freq: Four times a day (QID) | ORAL | Status: DC | PRN

## 2019-06-12 MED ORDER — SODIUM CHLORIDE 0.9 % IV SOLN
INTRAVENOUS | Status: DC | PRN
  Administered 2019-06-12: 15 ug/min via INTRAVENOUS

## 2019-06-12 MED ORDER — MIDAZOLAM HCL 5 MG/5ML IJ SOLN
INTRAMUSCULAR | Status: DC | PRN
  Administered 2019-06-12: 2 mg via INTRAVENOUS

## 2019-06-12 SURGICAL SUPPLY — 43 items
BANDAGE ESMARK 6X9 LF (GAUZE/BANDAGES/DRESSINGS) IMPLANT
BIT DRILL 2.5X110 QC LCP DISP (BIT) ×2 IMPLANT
BNDG COHESIVE 4X5 TAN STRL (GAUZE/BANDAGES/DRESSINGS) ×3 IMPLANT
BNDG ESMARK 6X9 LF (GAUZE/BANDAGES/DRESSINGS)
BNDG GAUZE ELAST 4 BULKY (GAUZE/BANDAGES/DRESSINGS) ×3 IMPLANT
CANISTER WOUNDNEG PRESSURE 500 (CANNISTER) ×2 IMPLANT
COVER SURGICAL LIGHT HANDLE (MISCELLANEOUS) ×3 IMPLANT
COVER WAND RF STERILE (DRAPES) ×3 IMPLANT
DRAPE INCISE IOBAN 66X45 STRL (DRAPES) ×2 IMPLANT
DRAPE OEC MINIVIEW 54X84 (DRAPES) ×2 IMPLANT
DRAPE U-SHAPE 47X51 STRL (DRAPES) ×3 IMPLANT
DRESSING VERAFLO CLEANSE CC (GAUZE/BANDAGES/DRESSINGS) IMPLANT
DRSG ADAPTIC 3X8 NADH LF (GAUZE/BANDAGES/DRESSINGS) ×3 IMPLANT
DRSG PAD ABDOMINAL 8X10 ST (GAUZE/BANDAGES/DRESSINGS) ×3 IMPLANT
DRSG VERAFLO CLEANSE CC (GAUZE/BANDAGES/DRESSINGS) ×3
DURAPREP 26ML APPLICATOR (WOUND CARE) ×3 IMPLANT
ELECT REM PT RETURN 9FT ADLT (ELECTROSURGICAL) ×3
ELECTRODE REM PT RTRN 9FT ADLT (ELECTROSURGICAL) ×1 IMPLANT
GAUZE SPONGE 4X4 12PLY STRL (GAUZE/BANDAGES/DRESSINGS) ×3 IMPLANT
GLOVE BIOGEL PI IND STRL 9 (GLOVE) ×1 IMPLANT
GLOVE BIOGEL PI INDICATOR 9 (GLOVE) ×2
GLOVE SURG ORTHO 9.0 STRL STRW (GLOVE) ×3 IMPLANT
GOWN STRL REUS W/ TWL XL LVL3 (GOWN DISPOSABLE) ×3 IMPLANT
GOWN STRL REUS W/TWL XL LVL3 (GOWN DISPOSABLE) ×6
KIT BASIN OR (CUSTOM PROCEDURE TRAY) ×3 IMPLANT
KIT TURNOVER KIT B (KITS) ×3 IMPLANT
MANIFOLD NEPTUNE II (INSTRUMENTS) ×3 IMPLANT
NS IRRIG 1000ML POUR BTL (IV SOLUTION) ×3 IMPLANT
PACK ORTHO EXTREMITY (CUSTOM PROCEDURE TRAY) ×3 IMPLANT
PAD ARMBOARD 7.5X6 YLW CONV (MISCELLANEOUS) ×6 IMPLANT
PAD NEG PRESSURE SENSATRAC (MISCELLANEOUS) ×2 IMPLANT
PUTTY DBM ALLOSYNC PURE 5CC (Putty) ×2 IMPLANT
SCREW CORTEX 3.5 50MM (Screw) ×6 IMPLANT
STAPLER VISISTAT 35W (STAPLE) IMPLANT
SUCTION FRAZIER HANDLE 10FR (MISCELLANEOUS) ×2
SUCTION TUBE FRAZIER 10FR DISP (MISCELLANEOUS) ×1 IMPLANT
SUT ETHILON 2 0 PSLX (SUTURE) ×6 IMPLANT
SUT VIC AB 2-0 CT1 27 (SUTURE) ×2
SUT VIC AB 2-0 CT1 TAPERPNT 27 (SUTURE) ×1 IMPLANT
TOWEL GREEN STERILE FF (TOWEL DISPOSABLE) ×3 IMPLANT
TOWEL OR 17X26 10 PK STRL BLUE (TOWEL DISPOSABLE) ×3 IMPLANT
TUBE CONNECTING 12'X1/4 (SUCTIONS) ×1
TUBE CONNECTING 12X1/4 (SUCTIONS) ×2 IMPLANT

## 2019-06-12 NOTE — Plan of Care (Signed)

## 2019-06-12 NOTE — Progress Notes (Signed)
[facility]  Date of Admission:  06/10/2019     Total days of antibiotics 3         ASSESSMENT/PLAN  Mr. Lalani is a 58 year old male admitted with subacute osteomyelitis status post incision and fixation of the right tibia/fibula after sustaining a fall from a ladder at the end of March.  Surgical specimens Gram stain showing gram-positive cocci.  Currently on day 3 of Ancef and scheduled for repeat I&D in the OR today.  Subacute osteomyelitis of left tibia -postop day 2 with wound VAC in place.  Scheduled for repeat I&D today.  Remains on vancomycin, ceftriaxone, and rifampin.  Operative specimens remain reintubated for better growth.  Continue broad-spectrum coverage pending identification of organism as able.  Will require at least 6 weeks of antibiotic therapy from the date of final surgery.  Place PICC line following surgery today.   Active Problems:   Displaced pilon fracture of right tibia, initial encounter for open fracture type IIIA, IIIB, or IIIC   Subacute osteomyelitis of right tibia [facility])   Surgery, elective   Pilon fracture of right tibia, sequela   . [MAR Hold] aspirin EC  81 mg Oral Daily  . [MAR Hold] docusate sodium  100 mg Oral BID  . feeding supplement  296 mL Oral Once  . [MAR Hold] gabapentin  300 mg Oral TID  . povidone-iodine  2 application Topical On Call to OR  . [MAR Hold] rifampin  300 mg Oral BID WC  . [MAR Hold] sertraline  150 mg Oral Daily    SUBJECTIVE:  Doing well. Afebrile with no complaints today. Pain is well managed.    No Known Allergies   Review of Systems: Review of Systems  Constitutional: Negative for chills, fever and weight loss.  Respiratory: Negative for cough, shortness of breath and wheezing.   Cardiovascular: Negative for chest pain and leg swelling.  Gastrointestinal: Negative for abdominal pain, constipation, diarrhea, nausea and vomiting.  Skin: Negative for rash.      OBJECTIVE:  Vitals:   06/11/19 1943 06/12/19 0507 06/12/19 0756 06/12/19 1221  BP: 125/71 119/69 131/74   Pulse: 74 73 75   Resp: 16 16 18    Temp: 98.7 F (37.1 C) 98.6 F (37 C) 98.4 F (36.9 C)   TempSrc: Oral Oral Oral   SpO2: 100% 96% 98%   Weight:    86.2 kg  Height:    5' 10.98" (1.803 m)   Body mass index is 26.51 kg/m.  Physical Exam Constitutional:      General: He is not in acute distress.    Appearance: Normal appearance. He is well-developed.  Cardiovascular:     Rate and Rhythm: Normal rate and regular rhythm.     Heart sounds: Normal heart sounds.  Pulmonary:     Effort: Pulmonary effort is normal.     Breath sounds: Normal breath sounds.  Musculoskeletal:     Comments: Wound vac patent with serosanguinous drainage in canister and tubing.   Skin:    General: Skin is warm and dry.  Neurological:     Mental Status: He is alert and oriented to person, place, and time.  Psychiatric:        Behavior: Behavior normal.        Thought Content: Thought content normal.        Judgment: Judgment normal.     Lab Results Lab Results  Component Value Date   WBC 6.4 05/08/2019  HGB 14.7 06/10/2019   HCT 41.9 05/08/2019   MCV 90.9 05/08/2019   PLT 238 05/08/2019    Lab Results  Component Value Date   CREATININE 1.18 06/11/2019   BUN 12 06/11/2019   NA 139 06/11/2019   K 3.9 06/11/2019   CL 105 06/11/2019   CO2 24 06/11/2019    Lab Results  Component Value Date   ALT 22 03/11/2019   AST 27 03/11/2019   ALKPHOS 65 03/11/2019   BILITOT 0.5 03/11/2019     Microbiology: Recent Results (from the past 240 hour(s))  SARS Coronavirus 2 (CEPHEID - Performed in [facility] hospital lab), Hosp Order     Status: None   Collection Time: 06/10/19  6:50 AM   Specimen: Nasopharyngeal Swab  Result Value Ref Range Status   SARS Coronavirus 2 NEGATIVE NEGATIVE Final    Comment: (NOTE) If result is NEGATIVE SARS-CoV-2 target nucleic acids are NOT DETECTED. The SARS-CoV-2  RNA is generally detectable in upper and lower  respiratory specimens during the acute phase of infection. The lowest  concentration of SARS-CoV-2 viral copies this assay can detect is 250  copies / mL. A negative result does not preclude SARS-CoV-2 infection  and should not be used as the sole basis for treatment or other  patient management decisions.  A negative result may occur with  improper specimen collection / handling, submission of specimen other  than nasopharyngeal swab, presence of viral mutation(s) within the  areas targeted by this assay, and inadequate number of viral copies  (<250 copies / mL). A negative result must be combined with clinical  observations, patient history, and epidemiological information. If result is POSITIVE SARS-CoV-2 target nucleic acids are DETECTED. The SARS-CoV-2 RNA is generally detectable in upper and lower  respiratory specimens dur ing the acute phase of infection.  Positive  results are indicative of active infection with SARS-CoV-2.  Clinical  correlation with patient history and other diagnostic information is  necessary to determine patient infection status.  Positive results do  not rule out bacterial infection or co-infection with other viruses. If result is PRESUMPTIVE POSTIVE SARS-CoV-2 nucleic acids MAY BE PRESENT.   A presumptive positive result was obtained on the submitted specimen  and confirmed on repeat testing.  While 2019 novel coronavirus  (SARS-CoV-2) nucleic acids may be present in the submitted sample  additional confirmatory testing may be necessary for epidemiological  and / or clinical management purposes  to differentiate between  SARS-CoV-2 and other Sarbecovirus currently known to infect humans.  If clinically indicated additional testing with an alternate test  methodology [phone]([id number]) is advised. The SARS-CoV-2 RNA is generally  detectable in upper and lower respiratory sp ecimens during the acute  phase of  infection. The expected result is Negative. Fact Sheet for Patients:  BoilerBrush.com.cyhttps://www.fda.gov/media/136312/download Fact Sheet for Healthcare Providers: https://pope.com/https://www.fda.gov/media/136313/download This test is not yet approved or cleared by the [place] FDA and has been authorized for detection and/or diagnosis of SARS-CoV-2 by FDA under an Emergency Use Authorization (EUA).  This EUA will remain in effect (meaning this test can be used) for the duration of the COVID-19 declaration under Section 564(b)(1) of the Act, 21 U.S.C. section 360bbb-3(b)(1), unless the authorization is terminated or revoked sooner. Performed at [facility], [address] [place], [place] [address]   Aerobic/Anaerobic Culture (surgical/deep wound)     Status: None (Preliminary result)   Collection Time: 06/10/19  9:47 AM   Specimen: Soft Tissue, Other  Result  Value Ref Range Status   Specimen Description TISSUE RIGHT ANKLE  Final   Special Requests PATIENT ON FOLLOWING ANCEF  Final   Gram Stain   Final    RARE WBC PRESENT, PREDOMINANTLY PMN RARE GRAM POSITIVE COCCI Performed at [facility], [address] [place], [place] [address]    Culture   Final    CULTURE REINCUBATED FOR BETTER GROWTH NO ANAEROBES ISOLATED; CULTURE IN PROGRESS FOR 5 DAYS    Report Status PENDING  Incomplete  Acid Fast Smear (AFB)     Status: None   Collection Time: 06/10/19  9:47 AM   Specimen: Soft Tissue, Other  Result Value Ref Range Status   AFB Specimen Processing Comment  Final    Comment: Tissue Grinding and Digestion/Decontamination   Acid Fast Smear Negative  Final    Comment: (NOTE) Performed At: [facility] [address] [place], [place] [id number] Jolene SchimkeNagendra Sanjai MD XB:[phone]    Source (AFB) TISSUE  Final    Comment: RIGHT ANKLE Performed at [facility], [address] [place], [place] [address]      Marcos EkeGreg Corrie Reder, NP [facility] [phone] Pager  06/12/2019  1:51 PM

## 2019-06-12 NOTE — Interval H&P Note (Signed)
History and Physical Interval Note:  06/12/2019 7:17 AM  Jonathan Bass  has presented today for surgery, with the diagnosis of Infected Right Pilon Fracture.  The various methods of treatment have been discussed with the patient and family. After consideration of risks, benefits and other options for treatment, the patient has consented to  Procedure(s): DEBRIDEMENT AND REVISION INTERNAL FIXATION INFECTED RIGHT PILON FRACTURE (Right) as a surgical intervention.  The patient's history has been reviewed, patient examined, no change in status, stable for surgery.  I have reviewed the patient's chart and labs.  Questions were answered to the patient's satisfaction.     Erlinda Hong, [facility] [phone]

## 2019-06-12 NOTE — Anesthesia Preprocedure Evaluation (Signed)
Anesthesia Evaluation  Patient identified by MRN, date of birth, ID band Patient awake    Reviewed: Allergy & Precautions, NPO status , Patient's Chart, lab work & pertinent test results  Airway Mallampati: II  TM Distance: >3 FB Neck ROM: Full    Dental no notable dental hx.    Pulmonary neg pulmonary ROS,    Pulmonary exam normal breath sounds clear to auscultation       Cardiovascular negative cardio ROS Normal cardiovascular exam Rhythm:Regular Rate:Normal     Neuro/Psych Anxiety negative neurological ROS     GI/Hepatic Neg liver ROS, GERD  ,  Endo/Other  negative endocrine ROS  Renal/GU negative Renal ROS  negative genitourinary   Musculoskeletal negative musculoskeletal ROS (+)   Abdominal   Peds negative pediatric ROS (+)  Hematology negative hematology ROS (+)   Anesthesia Other Findings   Reproductive/Obstetrics negative OB ROS                             Anesthesia Physical Anesthesia Plan  ASA: II  Anesthesia Plan: General   Post-op Pain Management:    Induction: Intravenous  PONV Risk Score and Plan: 2 and Ondansetron, Dexamethasone and Treatment may vary due to age or medical condition  Airway Management Planned: LMA  Additional Equipment:   Intra-op Plan:   Post-operative Plan: Extubation in OR  Informed Consent: I have reviewed the patients History and Physical, chart, labs and discussed the procedure including the risks, benefits and alternatives for the proposed anesthesia with the patient or authorized representative who has indicated his/her understanding and acceptance.     Dental advisory given  Plan Discussed with: CRNA and Surgeon  Anesthesia Plan Comments:         Anesthesia Quick Evaluation

## 2019-06-12 NOTE — Plan of Care (Signed)

## 2019-06-12 NOTE — Transfer of Care (Signed)
Immediate Anesthesia Transfer of Care Note  Patient: Jonathan Bass  Procedure(s) Performed: DEBRIDEMENT AND REVISION INTERNAL FIXATION INFECTED RIGHT PILON FRACTURE (Right Leg Lower)  Patient Location: PACU  Anesthesia Type:General  Level of Consciousness: awake, alert  and oriented  Airway & Oxygen Therapy: Patient Spontanous Breathing and Patient connected to face mask oxygen  Post-op Assessment: Report given to RN and Post -op Vital signs reviewed and stable  Post vital signs: Reviewed and stable  Last Vitals:  Vitals Value Taken Time  BP 136/78 06/12/19 1510  Temp 36.6 C 06/12/19 1510  Pulse 85 06/12/19 1518  Resp 10 06/12/19 1518  SpO2 98 % 06/12/19 1518  Vitals shown include unvalidated device data.  Last Pain:  Vitals:   06/12/19 1510  TempSrc:   PainSc: 5       Patients Stated Pain Goal: 3 (79/15/05 6979)  Complications: No apparent anesthesia complications

## 2019-06-12 NOTE — Anesthesia Procedure Notes (Signed)
Procedure Name: LMA Insertion Date/Time: 06/12/2019 2:06 PM Performed by: Jearld Pies, CRNA Pre-anesthesia Checklist: Patient identified, Emergency Drugs available, Suction available and Patient being monitored Patient Re-evaluated:Patient Re-evaluated prior to induction Oxygen Delivery Method: Circle System Utilized Preoxygenation: Pre-oxygenation with 100% oxygen Induction Type: IV induction LMA: LMA inserted LMA Size: 4.0 Number of attempts: 1 Airway Equipment and Method: Bite block Placement Confirmation: positive ETCO2 Tube secured with: Tape Dental Injury: Teeth and Oropharynx as per pre-operative assessment

## 2019-06-12 NOTE — Op Note (Signed)
06/12/2019  3:39 PM  PATIENT:  Jonathan Bass    PRE-OPERATIVE DIAGNOSIS:  Infected Right Pilon Fracture  POST-OPERATIVE DIAGNOSIS:  Same  PROCEDURE:  DEBRIDEMENT AND REVISION INTERNAL FIXATION INFECTED RIGHT PILON FRACTURE Application of cleanse choice wound VAC. Application of posterior splint.  SURGEON:  Newt Minion, MD  PHYSICIAN ASSISTANT:None ANESTHESIA:   General  PREOPERATIVE INDICATIONS:  Jonathan Bass is a  58 y.o. male with a diagnosis of Infected Right Pilon Fracture who failed conservative measures and elected for surgical management.    The risks benefits and alternatives were discussed with the patient preoperatively including but not limited to the risks of infection, bleeding, nerve injury, cardiopulmonary complications, the need for revision surgery, among others, and the patient was willing to proceed.  OPERATIVE IMPLANTS: Arthrex 5 cc bone graft  @ENCIMAGES @  OPERATIVE FINDINGS: There was additional nonviable tissue that was excised and this was sent for cultures of the tissue.  OPERATIVE PROCEDURE: Patient was brought the operating room and underwent a general anesthetic.  After adequate levels anesthesia were obtained patient's right lower extremity was prepped using DuraPrep draped into a sterile field a timeout was called.  The sutures were removed the wound edges were well approximated.  There was some nonviable tissue medially this was excised and sent for culture of the tissue.  Both wounds were irrigated with pulsatile lavage.  The proximal 3 screws on the lateral fibular plate were removed to allow for further debridement of the distal tibia.  This was debrided back to healthy viable bleeding bone.  Nonviable tissue was excised with a rondure.  After pulsatile lavage the plate was reapplied approximately with 3 new screws the second toe was aligned with the patella.  The screws were placed C-arm fluoroscopy verified alignment.  The incisions were closed  using 2-0 nylon.  The cleanse choice solid foam dressing was applied over both wounds.  This had a good suction fit.  Patient was placed in a posterior and sugar tong splint to provide better protection of the soft tissue.  Patient was extubated taken the PACU in stable condition.   DISCHARGE PLANNING:  Antibiotic duration: Continue antibiotics as per infectious disease anticipate 6 weeks of IV antibiotics.  This may be adjusted pending these last tissue cultures.  Weightbearing: Nonweightbearing on the right  Pain medication: Opioid pathway  Dressing care/ Wound VAC: Continue wound VAC for 1 week  Ambulatory devices: Walker or crutches  Discharge to: Anticipate discharge to home Monday or Tuesday patient will require a PICC line postoperatively.  Follow-up: In the office 1 week post operative.

## 2019-06-12 NOTE — Plan of Care (Signed)
  Problem: Education: Goal: Knowledge of General Education information will improve Description: Including pain rating scale, medication(s)/side effects and non-pharmacologic comfort measures Outcome: Progressing   Problem: Clinical Measurements: Goal: Cardiovascular complication will be avoided Outcome: Progressing   Problem: Activity: Goal: Risk for activity intolerance will decrease Outcome: Progressing   Problem: Nutrition: Goal: Adequate nutrition will be maintained Outcome: Progressing   Problem: Coping: Goal: Level of anxiety will decrease Outcome: Progressing   Problem: Pain Managment: Goal: General experience of comfort will improve Outcome: Progressing   

## 2019-06-12 NOTE — TOC Initial Note (Addendum)
Transition of Care [facility]) - Initial/Assessment Note    Patient Details  Name: Jonathan Bass MRN: [id number] Date of Birth: Apr 28, 1961  Transition of Care [facility]) CM/SW Contact:    Marilu Favre, RN Phone Number: 06/12/2019, 12:47 PM  Clinical Narrative:                  WILL NEED home health RN order and OPAT prescription  Patient from home with wife. Spoke with wife via phone. Discussed home health and IV Antibiotics.   [facility].  Wife aware home health will not be present every time a dose is due, she and patient will be taught how to administer antibiotic.  Confirmed face sheet information.     Expected Discharge Plan: [place] Barriers to Discharge: Continued Medical Work up   Patient Goals and CMS Choice     Choice offered to / list presented to : Spouse  Expected Discharge Plan and Services Expected Discharge Plan: [place]   Discharge Planning Services: CM Consult Post Acute Care Choice: [place] arrangements for the past 2 months: Single Family Home                 DME Arranged: N/A         HH Arranged: RN [place] Agency: [place] Date [place]: 06/12/19 Time HH Agency Contacted: [id number]    Prior Living Arrangements/Services Living arrangements for the past 2 months: [place] Lives with:: Spouse Patient language and need for interpreter reviewed:: Yes        Need for Family Participation in Patient Care: Yes (Comment) Care giver support system in place?: Yes (comment)   Criminal Activity/Legal Involvement Pertinent to Current Situation/Hospitalization: No - Comment as needed  Activities of Daily Living Home Assistive Devices/Equipment: Crutches, Cane (specify quad or straight), Walker (specify type) ADL Screening (condition at time of admission) Patient's cognitive ability adequate to safely complete daily activities?: Yes Is the  patient deaf or have difficulty hearing?: No Does the patient have difficulty seeing, even when wearing glasses/contacts?: No Does the patient have difficulty concentrating, remembering, or making decisions?: No Patient able to express need for assistance with ADLs?: Yes Does the patient have difficulty dressing or bathing?: No Independently performs ADLs?: Yes (appropriate for developmental age) Does the patient have difficulty walking or climbing stairs?: No Weakness of Legs: Right Weakness of Arms/Hands: None  Permission Sought/Granted Permission sought to share information with : Case Manager                Emotional Assessment              Admission diagnosis:  Open Right Tibia and Fibula Fracture Patient Active Problem List   Diagnosis Date Noted  . Pilon fracture of right tibia, sequela 06/10/2019  . Displaced pilon fracture of right tibia, initial encounter for open fracture type IIIA, IIIB, or IIIC   . Subacute osteomyelitis of right tibia ([place])   . Surgery, elective   . Anxiety   . Open pilon fracture, right, type III, initial encounter 03/11/2019  . Closed fracture of right distal radius 03/11/2019  . Type III open pilon fracture of right tibia 03/11/2019  . Pounding heartbeat 01/21/2019  . Precordial chest pain 03/05/2014   PCP:  Leighton Ruff, MD Pharmacy:   [id number] - [place], [place] - [address] [place] [address] Phone: [phone] Fax: [phone]     Social Determinants of Health (SDOH) Interventions    Readmission Risk Interventions No flowsheet data found.

## 2019-06-13 DIAGNOSIS — W19XXXS Unspecified fall, sequela: Secondary | ICD-10-CM

## 2019-06-13 DIAGNOSIS — S82871S Displaced pilon fracture of right tibia, sequela: Secondary | ICD-10-CM

## 2019-06-13 MED ORDER — SODIUM CHLORIDE 0.9% FLUSH: 10.0000 mL | INTRAVENOUS | Status: DC | PRN

## 2019-06-13 MED ORDER — SODIUM CHLORIDE 0.9% FLUSH
10.0000 mL | Freq: Two times a day (BID) | INTRAVENOUS | Status: DC
  Administered 2019-06-14 (×2): 10 mL

## 2019-06-13 NOTE — Evaluation (Signed)
Physical Therapy Evaluation & Discharge Patient Details Name: Jonathan Bass MRN: [id number] DOB: 10-Feb-1961 Today's Date: 06/13/2019   History of Present Illness  Pt is a 58 y.o. male admitted 06/10/19 with infected R pilon fx with R tibial subacute osteomyelitis. On 6/24, s/p R tib/fib excision, debridement, fixation. On 6/26, s/p R pilon fx debridement and revision of ORIF. PMH includes anxiety; initial R ankle fx with ORIF and ex fix on 03/11/19 (removal of ex fix 05/08/19).    Clinical Impression  Patient evaluated by Physical Therapy with no further acute PT needs identified. PTA, pt mod indep with RW/crutches/knee scooter since initial sx 02/2019. Today, pt mod indep with RW; great ability to maintain RLE NWB precautions. Reviewed LE therex. All education has been completed and the patient has no further questions. Acute PT is signing off. Thank you for this referral.    Follow Up Recommendations No PT follow up    Equipment Recommendations  None recommended by PT    Recommendations for Other Services       Precautions / Restrictions Precautions Precautions: Fall Precaution Comments: RLE wound vac Restrictions Weight Bearing Restrictions: Yes RLE Weight Bearing: Non weight bearing Other Position/Activity Restrictions: CAM walker boot in room, but unable to fit over R ankle splint      Mobility  Bed Mobility Overal bed mobility: Independent                Transfers Overall transfer level: Modified independent Equipment used: Rolling walker (2 wheeled)                Ambulation/Gait Ambulation/Gait assistance: Modified independent (Device/Increase time) Gait Distance (Feet): 100 Feet Assistive device: Rolling walker (2 wheeled) Gait Pattern/deviations: Step-to pattern Gait velocity: Decreased Gait velocity interpretation: 1.31 - 2.62 ft/sec, indicative of limited community ambulator General Gait Details: Mod indep gait with RW; good ability to hop on LLE  in order to maintain RLE NWB precautions  Stairs            Wheelchair Mobility    Modified Rankin (Stroke Patients Only)       Balance Overall balance assessment: Modified Independent                                           Pertinent Vitals/Pain Pain Assessment: Faces Faces Pain Scale: Hurts a little bit Pain Location: RLE Pain Descriptors / Indicators: Operative site guarding Pain Intervention(s): Monitored during session;RN gave pain meds during session    Home Living Family/patient expects to be discharged to:: Private residence Living Arrangements: Spouse/significant other Available Help at Discharge: Family;Available 24 hours/day Type of Home: House Home Access: Ramped entrance     Home Layout: Two level;Able to live on main level with bedroom/bathroom Home Equipment: Gilford Rile - 2 wheels;Crutches;Bedside commode;Other (comment)(ankle/knee scooter) Additional Comments: Uses BSC as shower seat    Prior Function Level of Independence: Independent with assistive device(s)         Comments: Since initial injury 03/06/19, pt mod indep with walker, crutches and knee scooter. Drives with L foot. Working part-time     Journalist, newspaper        Extremity/Trunk Assessment   Upper Extremity Assessment Upper Extremity Assessment: Overall WFL for tasks assessed    Lower Extremity Assessment Lower Extremity Assessment: RLE deficits/detail RLE Deficits / Details: s/p R tibial sx; hip/knee [facility]  Communication   Communication: No difficulties  Cognition Arousal/Alertness: Awake/alert Behavior During Therapy: WFL for tasks assessed/performed Overall Cognitive Status: Within Functional Limits for tasks assessed                                        General Comments      Exercises Other Exercises Other Exercises: SLR, LAQ, seated hip flex   Assessment/Plan    PT Assessment Patent does not need any further PT services   PT Problem List         PT Treatment Interventions      PT Goals (Current goals can be found in the Care Plan section)  Acute Rehab PT Goals PT Goal Formulation: All assessment and education complete, DC therapy    Frequency     Barriers to discharge        Co-evaluation               AM-PAC PT "6 Clicks" Mobility  Outcome Measure Help needed turning from your back to your side while in a flat bed without using bedrails?: None Help needed moving from lying on your back to sitting on the side of a flat bed without using bedrails?: None Help needed moving to and from a bed to a chair (including a wheelchair)?: None Help needed standing up from a chair using your arms (e.g., wheelchair or bedside chair)?: None Help needed to walk in hospital room?: None Help needed climbing 3-5 steps with a railing? : A Little 6 Click Score: 23    End of Session   Activity Tolerance: Patient tolerated treatment well Patient left: in chair;with call bell/phone within reach Nurse Communication: Mobility status PT Visit Diagnosis: Other abnormalities of gait and mobility (R26.89)    Time: [id number] PT Time Calculation (min) (ACUTE ONLY): 19 min   Charges:   PT Evaluation $PT Eval Low Complexity: 1 Low        Ina HomesJaclyn Marchella Hibbard, PT, DPT Acute Rehabilitation Services  Pager [phone] Office [phone]  Malachy ChamberJaclyn L Autrey Human 06/13/2019, 11:08 AM

## 2019-06-13 NOTE — Plan of Care (Signed)

## 2019-06-13 NOTE — Progress Notes (Signed)
Patient ID: Jonathan Bass, male   DOB: 1961/05/17, 58 y.o.   MRN: [id number] Patient is postoperative day 1 repeat irrigation and debridement of open pilon fracture right ankle.  Patient did have some nonviable tissue that was sent for cultures for possible persistent infection.  All cultures are pending.  Anticipate discharge on Monday or Tuesday nonweightbearing on the right 50 cc in the wound VAC canister.

## 2019-06-13 NOTE — Progress Notes (Signed)
Spoke with Alyse Low, RN concerning PICC placement. Discharge still pending results from [facility] and appropriate antibiotics per ID. Will follow up later today. Possible placement of SL today per ID.

## 2019-06-13 NOTE — Progress Notes (Signed)
RN attempted to update pt's wife of pt status. Unsuccessful at this time. Will continue to monitor.

## 2019-06-13 NOTE — Progress Notes (Signed)
Peripherally Inserted Central Catheter/Midline Placement  The IV Nurse has discussed with the patient and/or persons authorized to consent for the patient, the purpose of this procedure and the potential benefits and risks involved with this procedure.  The benefits include less needle sticks, lab draws from the catheter, and the patient may be discharged home with the catheter. Risks include, but not limited to, infection, bleeding, blood clot (thrombus formation), and puncture of an artery; nerve damage and irregular heartbeat and possibility to perform a PICC exchange if needed/ordered by physician.  Alternatives to this procedure were also discussed.  Bard Power PICC patient education guide, fact sheet on infection prevention and patient information card has been provided to patient /or left at bedside.    PICC/Midline Placement Documentation  PICC Double Lumen 06/13/19 PICC Right Brachial 42 cm 0 cm (Active)  Indication for Insertion or Continuance of Line Home intravenous therapies (PICC only) 06/13/19 1600  Exposed Catheter (cm) 0 cm 06/13/19 1600  Site Assessment Clean;Dry;Intact 06/13/19 1600  Lumen #1 Status Flushed;Saline locked;Blood return noted 06/13/19 1600  Lumen #2 Status Flushed;Saline locked;Blood return noted 06/13/19 1600  Dressing Type Transparent 06/13/19 1600  Dressing Status Clean;Dry;Intact;Antimicrobial disc in place 06/13/19 1600  Dressing Change Due 06/20/19 06/13/19 1600       Jonathan Bass 06/13/2019, 4:58 PM

## 2019-06-13 NOTE — Progress Notes (Signed)
INFECTIOUS DISEASE PROGRESS NOTE  ID: Jonathan Bass is a 58 y.o. male with  Active Problems:   Displaced pilon fracture of right tibia, initial encounter for open fracture type IIIA, IIIB, or IIIC   Subacute osteomyelitis of right tibia [facility])   Surgery, elective   Pilon fracture of right tibia, sequela  Subjective: No complaints.  Noted orange urine.   Abtx:  Anti-infectives (From admission, onward)   Start     Dose/Rate Route Frequency Ordered Stop   06/12/19 1400  ceFAZolin (ANCEF) IVPB 2g/100 mL premix  Status:  Discontinued     2 g 200 mL/hr over 30 Minutes Intravenous On call to O.R. 06/11/19 2104 06/12/19 1607   06/12/19 0600  ceFAZolin (ANCEF) IVPB 2g/100 mL premix  Status:  Discontinued     2 g 200 mL/hr over 30 Minutes Intravenous On call to O.R. 06/11/19 2104 06/11/19 2121   06/12/19 0100  vancomycin (VANCOCIN) IVPB 1000 mg/200 mL premix     1,000 mg 200 mL/hr over 60 Minutes Intravenous Every 12 hours 06/11/19 1337     06/11/19 2300  vancomycin (VANCOCIN) IVPB 1000 mg/200 mL premix  Status:  Discontinued     1,000 mg 200 mL/hr over 60 Minutes Intravenous Every 12 hours 06/11/19 1125 06/11/19 1134   06/11/19 1700  rifampin (RIFADIN) capsule 300 mg     300 mg Oral 2 times daily with meals 06/11/19 1143     06/11/19 1300  cefTRIAXone (ROCEPHIN) 2 g in sodium chloride 0.9 % 100 mL IVPB     2 g 200 mL/hr over 30 Minutes Intravenous Daily 06/11/19 1142     06/11/19 1300  rifampin (RIFADIN) capsule 300 mg  Status:  Discontinued     300 mg Oral Every 12 hours 06/11/19 1142 06/11/19 1143   06/11/19 1230  vancomycin (VANCOCIN) 1,500 mg in sodium chloride 0.9 % 500 mL IVPB     1,500 mg 250 mL/hr over 120 Minutes Intravenous  Once 06/11/19 1125 06/11/19 1525   06/10/19 1530  ceFAZolin (ANCEF) IVPB 1 g/50 mL premix  Status:  Discontinued     1 g 100 mL/hr over 30 Minutes Intravenous Every 6 hours 06/10/19 1244 06/11/19 1134   06/10/19 0645  ceFAZolin (ANCEF) IVPB 2g/100  mL premix     2 g 200 mL/hr over 30 Minutes Intravenous On call to O.R. 06/10/19 5176 06/10/19 0932      Medications:  Scheduled: . aspirin EC  81 mg Oral Daily  . docusate sodium  100 mg Oral BID  . gabapentin  300 mg Oral TID  . rifampin  300 mg Oral BID WC  . sertraline  150 mg Oral Daily    Objective: Vital signs in last 24 hours: Temp:  [97.8 F (36.6 C)-98.1 F (36.7 C)] 98.1 F (36.7 C) (06/27 1226) Pulse Rate:  [65-87] 65 (06/27 1226) Resp:  [12-17] 17 (06/27 1226) BP: (115-150)/(62-88) 150/88 (06/27 1226) SpO2:  [98 %-100 %] 99 % (06/27 1226)   General appearance: alert and no distress Extremities: RLE dressed, drain in place, clean.   Lab Results Recent Labs    06/11/19 1220  NA 139  K 3.9  CL 105  CO2 24  BUN 12  CREATININE 1.18   Liver Panel No results for input(s): PROT, ALBUMIN, AST, ALT, ALKPHOS, BILITOT, BILIDIR, IBILI in the last 72 hours. Sedimentation Rate No results for input(s): ESRSEDRATE in the last 72 hours. C-Reactive Protein No results for input(s): CRP in the last  72 hours.  Microbiology: Recent Results (from the past 240 hour(s))  SARS Coronavirus 2 (CEPHEID - Performed in [facility] lab), Hosp Order     Status: None   Collection Time: 06/10/19  6:50 AM   Specimen: Nasopharyngeal Swab  Result Value Ref Range Status   SARS Coronavirus 2 NEGATIVE NEGATIVE Final    Comment: (NOTE) If result is NEGATIVE SARS-CoV-2 target nucleic acids are NOT DETECTED. The SARS-CoV-2 RNA is generally detectable in upper and lower  respiratory specimens during the acute phase of infection. The lowest  concentration of SARS-CoV-2 viral copies this assay can detect is 250  copies / mL. A negative result does not preclude SARS-CoV-2 infection  and should not be used as the sole basis for treatment or other  patient management decisions.  A negative result may occur with  improper specimen collection / handling, submission of specimen other   than nasopharyngeal swab, presence of viral mutation(s) within the  areas targeted by this assay, and inadequate number of viral copies  (<250 copies / mL). A negative result must be combined with clinical  observations, patient history, and epidemiological information. If result is POSITIVE SARS-CoV-2 target nucleic acids are DETECTED. The SARS-CoV-2 RNA is generally detectable in upper and lower  respiratory specimens dur ing the acute phase of infection.  Positive  results are indicative of active infection with SARS-CoV-2.  Clinical  correlation with patient history and other diagnostic information is  necessary to determine patient infection status.  Positive results do  not rule out bacterial infection or co-infection with other viruses. If result is PRESUMPTIVE POSTIVE SARS-CoV-2 nucleic acids MAY BE PRESENT.   A presumptive positive result was obtained on the submitted specimen  and confirmed on repeat testing.  While 2019 novel coronavirus  (SARS-CoV-2) nucleic acids may be present in the submitted sample  additional confirmatory testing may be necessary for epidemiological  and / or clinical management purposes  to differentiate between  SARS-CoV-2 and other Sarbecovirus currently known to infect humans.  If clinically indicated additional testing with an alternate test  methodology [phone]([id number]) is advised. The SARS-CoV-2 RNA is generally  detectable in upper and lower respiratory sp ecimens during the acute  phase of infection. The expected result is Negative. Fact Sheet for Patients:  BoilerBrush.com.cyhttps://www.fda.gov/media/136312/download Fact Sheet for Healthcare Providers: https://pope.com/https://www.fda.gov/media/136313/download This test is not yet approved or cleared by the [place] FDA and has been authorized for detection and/or diagnosis of SARS-CoV-2 by FDA under an Emergency Use Authorization (EUA).  This EUA will remain in effect (meaning this test can be used) for the duration of the  COVID-19 declaration under Section 564(b)(1) of the Act, 21 U.S.C. section 360bbb-3(b)(1), unless the authorization is terminated or revoked sooner. Performed at [facility], [address] [place], [place] [address]   Aerobic/Anaerobic Culture (surgical/deep wound)     Status: None (Preliminary result)   Collection Time: 06/10/19  9:47 AM   Specimen: Soft Tissue, Other  Result Value Ref Range Status   Specimen Description TISSUE RIGHT ANKLE  Final   Special Requests PATIENT ON FOLLOWING ANCEF  Final   Gram Stain   Final    RARE WBC PRESENT, PREDOMINANTLY PMN RARE GRAM POSITIVE COCCI Performed at [facility], [address] [place], [place] [address]    Culture   Final    CULTURE REINCUBATED FOR BETTER GROWTH NO ANAEROBES ISOLATED; CULTURE IN PROGRESS FOR 5 DAYS    Report Status PENDING  Incomplete  Acid Fast Smear (AFB)     Status: None   Collection Time: 06/10/19  9:47 AM   Specimen: Soft Tissue, Other  Result Value Ref Range Status   AFB Specimen Processing Comment  Final    Comment: Tissue Grinding and Digestion/Decontamination   Acid Fast Smear Negative  Final    Comment: (NOTE) Performed At: [facility] [address] [place], [place] [id number] Jolene SchimkeNagendra Sanjai MD WU:[id number]:[phone]    Source (AFB) TISSUE  Final    Comment: RIGHT ANKLE Performed at [facility], [address] [place], [place] [address]   Aerobic/Anaerobic Culture (surgical/deep wound)     Status: None (Preliminary result)   Collection Time: 06/12/19  2:23 PM   Specimen: Soft Tissue, Other  Result Value Ref Range Status   Specimen Description TISSUE RIGHT TIBIA  Final   Special Requests A  Final   Gram Stain   Final    RARE WBC PRESENT, PREDOMINANTLY PMN NO ORGANISMS SEEN    Culture   Final    CULTURE REINCUBATED FOR BETTER GROWTH Performed at [facility], [address] [place], [place] [address]    Report Status PENDING  Incomplete    Studies/Results:  [place] Ekg Site Rite  Result Date: 06/12/2019 If Site Rite image not attached, placement could not be confirmed due to current cardiac rhythm.    Assessment/Plan: Osteomyelitis of R ankle  Tib-Fib fixation post fall 02-2019  Total days of antibiotics: 2 vanco/ceftriaxone/rifampin  Cx from 6-24 is pending Cx from 6-26 is pending Will check LFTs on rifampin (nl 02-2019) Will to continue to watch         Johny SaxJeffrey Susanne Baumgarner MD, FACP Infectious Diseases (pager) [phone] [email or web address] 06/13/2019, 1:50 PM  LOS: 3 days

## 2019-06-13 NOTE — Plan of Care (Signed)

## 2019-06-14 DIAGNOSIS — B957 Other staphylococcus as the cause of diseases classified elsewhere: Secondary | ICD-10-CM

## 2019-06-14 DIAGNOSIS — B952 Enterococcus as the cause of diseases classified elsewhere: Secondary | ICD-10-CM

## 2019-06-14 LAB — COMPREHENSIVE METABOLIC PANEL
ALT: 30 U/L (ref 0–44)
AST: 33 U/L (ref 15–41)
Albumin: 3.3 g/dL — ABNORMAL LOW (ref 3.5–5.0)
Alkaline Phosphatase: 82 U/L (ref 38–126)
Anion gap: 9 (ref 5–15)
BUN: 13 mg/dL (ref 6–20)
CO2: 21 mmol/L — ABNORMAL LOW (ref 22–32)
Calcium: 8.9 mg/dL (ref 8.9–10.3)
Chloride: 108 mmol/L (ref 98–111)
Creatinine, Ser: 1.04 mg/dL (ref 0.61–1.24)
GFR calc Af Amer: >60 mL/min (ref >60)
GFR calc non Af Amer: >60 mL/min (ref >60)
Glucose, Bld: 140 mg/dL — ABNORMAL HIGH (ref 70–99)
Potassium: 3.6 mmol/L (ref 3.5–5.1)
Sodium: 138 mmol/L (ref 135–145)
Total Bilirubin: 0.6 mg/dL (ref 0.3–1.2)
Total Protein: 6.1 g/dL — ABNORMAL LOW (ref 6.5–8.1)

## 2019-06-14 MED ORDER — SODIUM CHLORIDE 0.9 % IV SOLN
2.0000 g | Freq: Three times a day (TID) | INTRAVENOUS | Status: DC
  Administered 2019-06-14 – 2019-06-15 (×3): 2 g via INTRAVENOUS
  Filled 2019-06-14 (×3): qty 2

## 2019-06-14 NOTE — Progress Notes (Signed)
Patient ID: Jonathan Bass, male   DOB: 1961-05-12, 58 y.o.   MRN: [id number] PICC line is in.  The patient is comfortable overall.  The VAC on his right ankle has a good seal.  Gram stain shows rare gram negative rods.  Culture pending in terms of identification and sensitivity.

## 2019-06-14 NOTE — Plan of Care (Signed)
  Problem: Education: Goal: Knowledge of General Education information will improve Description: Including pain rating scale, medication(s)/side effects and non-pharmacologic comfort measures Outcome: Progressing   Problem: Clinical Measurements: Goal: Ability to maintain clinical measurements within normal limits will improve Outcome: Progressing   

## 2019-06-14 NOTE — Progress Notes (Addendum)
Pharmacy Antibiotic Note  Jonathan Bass is a 58 y.o. male admitted on 06/10/2019 with necrotic wound over previous open fracture site of right tibia/fibula.  Pharmacy has been consulted for vancomycin dosing. Patient is s/p debridement in the OR on 6/24. Infected tissue was noted to penetrate to bone.   Plan: D5 abx Vanc 1500mg  x1, then 1gm q 12,AUC 509, SCr 1.18 Rifampin 300 mg BID  CTX changed to Cefepime 2gm q8 for GNR resulted as Pseudomonas putida (text to ID MD) 2 wound cx pending: E faecalis, Staph epi, GNR ID consulted, cont all abx for now, await Cx/sens On rifampin and ceftriaxone due to open fracture and associated hardware  VAC in place, LFTs wnl (Rifampin)    Height: 5' 10.98" (180.3 cm) Weight: 190 lb (86.2 kg) IBW/kg (Calculated) : 75.26  Temp (24hrs), Avg:98.3 F (36.8 C), Min:97.9 F (36.6 C), Max:98.8 F (37.1 C)   No Known Allergies  Antimicrobials this admission: Ancef 6/24>>6/25 Vanc 6/25>> CTX 6/25 >> 6/28 Rifampin 6/25 >> Cefepime  Microbiology results: 6/24 wound cx R ankle: few E faecalis, mod Staph epi > Re-isolating culture 6/24 woundCx R tibia: Pseudomonas putida 6/24 Tissue AFB: sent 6/24 Tissue Fungus: sent 6/24 Covid: neg  Thank you for allowing pharmacy to be a part of this patient's care.  Minda Ditto PharmD [phone] 06/14/2019 1:10 PM

## 2019-06-14 NOTE — Progress Notes (Signed)
INFECTIOUS DISEASE PROGRESS NOTE  ID: Jonathan Bass is a 58 y.o. male with  Active Problems:   Displaced pilon fracture of right tibia, initial encounter for open fracture type IIIA, IIIB, or IIIC   Subacute osteomyelitis of right tibia [facility])   Surgery, elective   Pilon fracture of right tibia, sequela  Subjective: No complaints.   Abtx:  Anti-infectives (From admission, onward)   Start     Dose/Rate Route Frequency Ordered Stop   06/12/19 1400  ceFAZolin (ANCEF) IVPB 2g/100 mL premix  Status:  Discontinued     2 g 200 mL/hr over 30 Minutes Intravenous On call to O.R. 06/11/19 2104 06/12/19 1607   06/12/19 0600  ceFAZolin (ANCEF) IVPB 2g/100 mL premix  Status:  Discontinued     2 g 200 mL/hr over 30 Minutes Intravenous On call to O.R. 06/11/19 2104 06/11/19 2121   06/12/19 0100  vancomycin (VANCOCIN) IVPB 1000 mg/200 mL premix     1,000 mg 200 mL/hr over 60 Minutes Intravenous Every 12 hours 06/11/19 1337     06/11/19 2300  vancomycin (VANCOCIN) IVPB 1000 mg/200 mL premix  Status:  Discontinued     1,000 mg 200 mL/hr over 60 Minutes Intravenous Every 12 hours 06/11/19 1125 06/11/19 1134   06/11/19 1700  rifampin (RIFADIN) capsule 300 mg     300 mg Oral 2 times daily with meals 06/11/19 1143     06/11/19 1300  cefTRIAXone (ROCEPHIN) 2 g in sodium chloride 0.9 % 100 mL IVPB     2 g 200 mL/hr over 30 Minutes Intravenous Daily 06/11/19 1142     06/11/19 1300  rifampin (RIFADIN) capsule 300 mg  Status:  Discontinued     300 mg Oral Every 12 hours 06/11/19 1142 06/11/19 1143   06/11/19 1230  vancomycin (VANCOCIN) 1,500 mg in sodium chloride 0.9 % 500 mL IVPB     1,500 mg 250 mL/hr over 120 Minutes Intravenous  Once 06/11/19 1125 06/11/19 1525   06/10/19 1530  ceFAZolin (ANCEF) IVPB 1 g/50 mL premix  Status:  Discontinued     1 g 100 mL/hr over 30 Minutes Intravenous Every 6 hours 06/10/19 1244 06/11/19 1134   06/10/19 0645  ceFAZolin (ANCEF) IVPB 2g/100 mL premix     2 g  200 mL/hr over 30 Minutes Intravenous On call to O.R. 06/10/19 1093 06/10/19 0932      Medications:  Scheduled: . aspirin EC  81 mg Oral Daily  . docusate sodium  100 mg Oral BID  . gabapentin  300 mg Oral TID  . rifampin  300 mg Oral BID WC  . sertraline  150 mg Oral Daily  . sodium chloride flush  10-40 mL Intracatheter Q12H    Objective: Vital signs in last 24 hours: Temp:  [97.9 F (36.6 C)-98.8 F (37.1 C)] 98.2 F (36.8 C) (06/28 0741) Pulse Rate:  [63-70] 66 (06/28 0741) Resp:  [16-18] 18 (06/28 0741) BP: (117-150)/(73-88) 117/73 (06/28 0741) SpO2:  [96 %-100 %] 97 % (06/28 0741)   General appearance: alert, cooperative and no distress Extremities: R foot dressed. normal light touch toes.   Lab Results No results for input(s): WBC, HGB, HCT, NA, K, CL, CO2, BUN, CREATININE, GLU in the last 72 hours.  Invalid input(s): PLATELETS Liver Panel No results for input(s): PROT, ALBUMIN, AST, ALT, ALKPHOS, BILITOT, BILIDIR, IBILI in the last 72 hours. Sedimentation Rate No results for input(s): ESRSEDRATE in the last 72 hours. C-Reactive Protein No results for input(s):  CRP in the last 72 hours.  Microbiology: Recent Results (from the past 240 hour(s))  SARS Coronavirus 2 (CEPHEID - Performed in [facility] hospital lab), Hosp Order     Status: None   Collection Time: 06/10/19  6:50 AM   Specimen: Nasopharyngeal Swab  Result Value Ref Range Status   SARS Coronavirus 2 NEGATIVE NEGATIVE Final    Comment: (NOTE) If result is NEGATIVE SARS-CoV-2 target nucleic acids are NOT DETECTED. The SARS-CoV-2 RNA is generally detectable in upper and lower  respiratory specimens during the acute phase of infection. The lowest  concentration of SARS-CoV-2 viral copies this assay can detect is 250  copies / mL. A negative result does not preclude SARS-CoV-2 infection  and should not be used as the sole basis for treatment or other  patient management decisions.  A negative result  may occur with  improper specimen collection / handling, submission of specimen other  than nasopharyngeal swab, presence of viral mutation(s) within the  areas targeted by this assay, and inadequate number of viral copies  (<250 copies / mL). A negative result must be combined with clinical  observations, patient history, and epidemiological information. If result is POSITIVE SARS-CoV-2 target nucleic acids are DETECTED. The SARS-CoV-2 RNA is generally detectable in upper and lower  respiratory specimens dur ing the acute phase of infection.  Positive  results are indicative of active infection with SARS-CoV-2.  Clinical  correlation with patient history and other diagnostic information is  necessary to determine patient infection status.  Positive results do  not rule out bacterial infection or co-infection with other viruses. If result is PRESUMPTIVE POSTIVE SARS-CoV-2 nucleic acids MAY BE PRESENT.   A presumptive positive result was obtained on the submitted specimen  and confirmed on repeat testing.  While 2019 novel coronavirus  (SARS-CoV-2) nucleic acids may be present in the submitted sample  additional confirmatory testing may be necessary for epidemiological  and / or clinical management purposes  to differentiate between  SARS-CoV-2 and other Sarbecovirus currently known to infect humans.  If clinically indicated additional testing with an alternate test  methodology [phone]([id number]) is advised. The SARS-CoV-2 RNA is generally  detectable in upper and lower respiratory sp ecimens during the acute  phase of infection. The expected result is Negative. Fact Sheet for Patients:  BoilerBrush.com.cyhttps://www.fda.gov/media/136312/download Fact Sheet for Healthcare Providers: https://pope.com/https://www.fda.gov/media/136313/download This test is not yet approved or cleared by the [place] FDA and has been authorized for detection and/or diagnosis of SARS-CoV-2 by FDA under an Emergency Use Authorization (EUA).   This EUA will remain in effect (meaning this test can be used) for the duration of the COVID-19 declaration under Section 564(b)(1) of the Act, 21 U.S.C. section 360bbb-3(b)(1), unless the authorization is terminated or revoked sooner. Performed at [facility], [address] [place], [place] [address]   Aerobic/Anaerobic Culture (surgical/deep wound)     Status: None (Preliminary result)   Collection Time: 06/10/19  9:47 AM   Specimen: Soft Tissue, Other  Result Value Ref Range Status   Specimen Description TISSUE RIGHT ANKLE  Final   Special Requests PATIENT ON FOLLOWING ANCEF  Final   Gram Stain   Final    RARE WBC PRESENT, PREDOMINANTLY PMN RARE GRAM POSITIVE COCCI    Culture   Final    FEW ENTEROCOCCUS FAECALIS MODERATE STAPHYLOCOCCUS EPIDERMIDIS REISOLATING Performed at [facility], [address] [place], [place] [address]    Report Status PENDING  Incomplete  Acid Fast  Smear (AFB)     Status: None   Collection Time: 06/10/19  9:47 AM   Specimen: Soft Tissue, Other  Result Value Ref Range Status   AFB Specimen Processing Comment  Final    Comment: Tissue Grinding and Digestion/Decontamination   Acid Fast Smear Negative  Final    Comment: (NOTE) Performed At: [facility] [address] [place], [place] [id number] Jolene SchimkeNagendra Sanjai MD WU:[id number]:[phone]    Source (AFB) TISSUE  Final    Comment: RIGHT ANKLE Performed at [facility], [address] [place], [place] [address]   Aerobic/Anaerobic Culture (surgical/deep wound)     Status: None (Preliminary result)   Collection Time: 06/12/19  2:23 PM   Specimen: Soft Tissue, Other  Result Value Ref Range Status   Specimen Description TISSUE RIGHT TIBIA  Final   Special Requests A  Final   Gram Stain   Final    RARE WBC PRESENT, PREDOMINANTLY PMN NO ORGANISMS SEEN Performed at [facility], [address] [place], [place] [address]    Culture RARE GRAM NEGATIVE RODS  Final   Report  Status PENDING  Incomplete    Studies/Results: [place] Ekg Site Rite  Result Date: 06/12/2019 If Site Rite image not attached, placement could not be confirmed due to current cardiac rhythm.    Assessment/Plan: Osteomyelitis of R ankle  Tib-Fib fixation post fall 02-2019  Total days of antibiotics: 3 vanco/ceftriaxone/rifampin  Cx from 6-24: E faecalis, S epidermidis Cx from 6-26: GNR Will check LFTs on rifampin (nl 02-2019) With his disparate Cx, stuck with broad anbx at this point (hopefully Staph is not methicillin resistant, enterococcus not Amp resistant, and can drop vanco) Will to continue to watch         Johny SaxJeffrey Kamarius Buckbee MD, FACP Infectious Diseases (pager) [phone] [email or web address] 06/14/2019, 12:22 PM  LOS: 4 days

## 2019-06-15 ENCOUNTER — Encounter (HOSPITAL_COMMUNITY): Payer: Self-pay | Admitting: Orthopedic Surgery

## 2019-06-15 DIAGNOSIS — B965 Pseudomonas (aeruginosa) (mallei) (pseudomallei) as the cause of diseases classified elsewhere: Secondary | ICD-10-CM

## 2019-06-15 LAB — AEROBIC/ANAEROBIC CULTURE W GRAM STAIN (SURGICAL/DEEP WOUND)

## 2019-06-15 MED ORDER — HYDROCODONE-ACETAMINOPHEN 5-325 MG PO TABS: 1.0000 | ORAL_TABLET | ORAL | 0 refills | Status: DC | PRN

## 2019-06-15 MED ORDER — CEFEPIME IV (FOR PTA / DISCHARGE USE ONLY): 2.0000 g | Freq: Three times a day (TID) | INTRAVENOUS | 0 refills | Status: AC

## 2019-06-15 MED ORDER — CIPROFLOXACIN HCL 500 MG PO TABS: 500.0000 mg | ORAL_TABLET | Freq: Two times a day (BID) | ORAL | 0 refills | Status: DC

## 2019-06-15 MED ORDER — VANCOMYCIN IV (FOR PTA / DISCHARGE USE ONLY): 1000.0000 mg | Freq: Two times a day (BID) | INTRAVENOUS | 0 refills | Status: AC

## 2019-06-15 MED ORDER — VANCOMYCIN IV (FOR PTA / DISCHARGE USE ONLY): 1000.0000 mg | Freq: Two times a day (BID) | INTRAVENOUS | 0 refills | Status: DC

## 2019-06-15 MED ORDER — RIFAMPIN 300 MG PO CAPS: 300.0000 mg | ORAL_CAPSULE | Freq: Two times a day (BID) | ORAL | 0 refills | Status: AC

## 2019-06-15 NOTE — Discharge Summary (Signed)
Discharge Diagnoses:  Active Problems:   Displaced pilon fracture of right tibia, initial encounter for open fracture type IIIA, IIIB, or IIIC   Subacute osteomyelitis of right tibia [facility](HCC)   Surgery, elective   Pilon fracture of right tibia, sequela   Surgeries: Procedure(s): DEBRIDEMENT AND REVISION INTERNAL FIXATION INFECTED RIGHT PILON FRACTURE on 06/12/2019    Consultants:   Discharged Condition: Improved  Hospital Course: Aldean AstStanley J Deblasi is an 58 y.o. male who was admitted 06/10/2019 with a chief complaint of infected right pilon fracture, with a final diagnosis of Infected Right Pilon Fracture.  Patient was brought to the operating room on 06/12/2019 and underwent Procedure(s): DEBRIDEMENT AND REVISION INTERNAL FIXATION INFECTED RIGHT PILON FRACTURE.    Patient was given perioperative antibiotics:  Anti-infectives (From admission, onward)   Start     Dose/Rate Route Frequency Ordered Stop   06/15/19 0000  ciprofloxacin (CIPRO) 500 MG tablet     500 mg Oral 2 times daily 06/15/19 0653 06/29/19 2359   06/14/19 2000  ceFEPIme (MAXIPIME) 2 g in sodium chloride 0.9 % 100 mL IVPB     2 g 200 mL/hr over 30 Minutes Intravenous Every 8 hours 06/14/19 1835     06/12/19 1400  ceFAZolin (ANCEF) IVPB 2g/100 mL premix  Status:  Discontinued     2 g 200 mL/hr over 30 Minutes Intravenous On call to O.R. 06/11/19 2104 06/12/19 1607   06/12/19 0600  ceFAZolin (ANCEF) IVPB 2g/100 mL premix  Status:  Discontinued     2 g 200 mL/hr over 30 Minutes Intravenous On call to O.R. 06/11/19 2104 06/11/19 2121   06/12/19 0100  vancomycin (VANCOCIN) IVPB 1000 mg/200 mL premix     1,000 mg 200 mL/hr over 60 Minutes Intravenous Every 12 hours 06/11/19 1337     06/11/19 2300  vancomycin (VANCOCIN) IVPB 1000 mg/200 mL premix  Status:  Discontinued     1,000 mg 200 mL/hr over 60 Minutes Intravenous Every 12 hours 06/11/19 1125 06/11/19 1134   06/11/19 1700  rifampin (RIFADIN) capsule 300 mg     300 mg Oral  2 times daily with meals 06/11/19 1143     06/11/19 1300  cefTRIAXone (ROCEPHIN) 2 g in sodium chloride 0.9 % 100 mL IVPB  Status:  Discontinued     2 g 200 mL/hr over 30 Minutes Intravenous Daily 06/11/19 1142 06/14/19 1835   06/11/19 1300  rifampin (RIFADIN) capsule 300 mg  Status:  Discontinued     300 mg Oral Every 12 hours 06/11/19 1142 06/11/19 1143   06/11/19 1230  vancomycin (VANCOCIN) 1,500 mg in sodium chloride 0.9 % 500 mL IVPB     1,500 mg 250 mL/hr over 120 Minutes Intravenous  Once 06/11/19 1125 06/11/19 1525   06/10/19 1530  ceFAZolin (ANCEF) IVPB 1 g/50 mL premix  Status:  Discontinued     1 g 100 mL/hr over 30 Minutes Intravenous Every 6 hours 06/10/19 1244 06/11/19 1134   06/10/19 0645  ceFAZolin (ANCEF) IVPB 2g/100 mL premix     2 g 200 mL/hr over 30 Minutes Intravenous On call to O.R. 06/10/19 [id number] 06/10/19 0932    .  Patient was given sequential compression devices, early ambulation, and aspirin for DVT prophylaxis.  Recent vital signs:  Patient Vitals for the past 24 hrs:  BP Temp Temp src Pulse Resp SpO2  06/15/19 0358 101/71 98.2 F (36.8 C) Oral 64 16 96 %  06/14/19 1924 (!) 131/91 98.1 F (36.7 C) Oral 67 18  99 %  06/14/19 1440 130/73 98.6 F (37 C) Oral 69 18 100 %  06/14/19 0741 117/73 98.2 F (36.8 C) Oral 66 18 97 %  .  Recent laboratory studies: No results found.  Discharge Medications:   Allergies as of 06/15/2019   No Known Allergies     Medication List    STOP taking these medications   hydrocortisone cream 1 %   ibuprofen 600 MG tablet Commonly known as: ADVIL   sulfamethoxazole-trimethoprim 800-160 MG tablet Commonly known as: BACTRIM DS     TAKE these medications   acetaminophen 325 MG tablet Commonly known as: TYLENOL Take 650 mg by mouth 2 (two) times daily as needed (pain.).   ascorbic acid 500 MG tablet Commonly known as: VITAMIN C Take 1 tablet (500 mg total) by mouth daily.   aspirin EC 81 MG tablet Take 81 mg by  mouth daily.   ciprofloxacin 500 MG tablet Commonly known as: Cipro Take 1 tablet (500 mg total) by mouth 2 (two) times daily for 14 days.   gabapentin 300 MG capsule Commonly known as: NEURONTIN Take 1 capsule (300 mg total) by mouth 3 (three) times daily.   HYDROcodone-acetaminophen 5-325 MG tablet Commonly known as: NORCO/VICODIN Take 1 tablet by mouth every 4 (four) hours as needed. What changed:   when to take this  reasons to take this  additional instructions   propranolol 10 MG tablet Commonly known as: INDERAL Take 10 mg by mouth 2 (two) times daily as needed (anxiety).   sertraline 100 MG tablet Commonly known as: ZOLOFT Take 150 mg by mouth daily.   Vitamin D3 125 MCG (5000 UT) Tabs Take 1 tablet (5,000 Units total) by mouth daily.            Discharge Care Instructions  (From admission, onward)         Start     Ordered   06/15/19 0000  Non weight bearing    Question Answer Comment  Laterality right   Extremity Lower      06/15/19 0655          Diagnostic Studies: Dg Ankle Right Port  Result Date: 06/10/2019 CLINICAL DATA:  Status post surgical repair of right ankle fracture. EXAM: PORTABLE RIGHT ANKLE - 2 VIEW COMPARISON:  Fluoroscopic images of May 08, 2019. Radiographs of March 11, 2019. FINDINGS: Status post surgical internal fixation of comminuted and displaced distal right fibular and tibial fractures. No significant callus formation or healing is seen involving these fractures. No significant soft tissue abnormality is noted. IMPRESSION: Status post surgical internal fixation of comminuted and displaced distal right fibular and tibial fractures. Electronically Signed   By: Marijo Conception M.D.   On: 06/10/2019 13:43   [place] Ekg Site Rite  Result Date: 06/12/2019 If Site Rite image not attached, placement could not be confirmed due to current cardiac rhythm.   Patient benefited maximally from their hospital stay and there were no  complications.     Disposition: Discharge disposition: 01-Home or Self Care      Discharge Instructions    Call MD / Call 911   Complete by: As directed    If you experience chest pain or shortness of breath, CALL 911 and be transported to the hospital emergency room.  If you develope a fever above 101 F, pus (white drainage) or increased drainage or redness at the wound, or calf pain, call your surgeon's office.   Constipation Prevention   Complete by:  As directed    Drink plenty of fluids.  Prune juice may be helpful.  You may use a stool softener, such as Colace (over the counter) 100 mg twice a day.  Use MiraLax (over the counter) for constipation as needed.   Diet - low sodium heart healthy   Complete by: As directed    Elevate operative extremity   Complete by: As directed    Increase activity slowly as tolerated   Complete by: As directed    Non weight bearing   Complete by: As directed    Laterality: right   Extremity: Lower     Follow-up Information    Nadara Mustarduda, Desmon Hitchner V, MD In 1 week.   Specialty: Orthopedic Surgery Contact information: [address] [place] [address] [phone]            Signed: Nadara MustardMarcus V Deette Revak 06/15/2019, 6:55 AM

## 2019-06-15 NOTE — Plan of Care (Signed)
  Problem: Pain Managment: Goal: General experience of comfort will improve Outcome: Progressing   Problem: Safety: Goal: Ability to remain free from injury will improve Outcome: Progressing   Problem: Skin Integrity: Goal: Risk for impaired skin integrity will decrease Outcome: Progressing   

## 2019-06-15 NOTE — Plan of Care (Signed)
  Problem: Health Behavior/Discharge Planning: Goal: Ability to manage health-related needs will improve Outcome: Progressing   Problem: Clinical Measurements: Goal: Will remain free from infection Outcome: Progressing   Problem: Pain Managment: Goal: General experience of comfort will improve Outcome: Progressing   Problem: Safety: Goal: Ability to remain free from injury will improve Outcome: Progressing   

## 2019-06-15 NOTE — Progress Notes (Signed)
Patient discharging home. Discharge instructions explained to patient and wife and they both verbalized understanding. Wound vac switched to prevena with good suction. Took all personal belongings. No further questions or concerns voiced.

## 2019-06-15 NOTE — Progress Notes (Signed)
[name] for Infectious Disease  Date of Admission:  06/10/2019     Total days of antibiotics 4         ASSESSMENT/PLAN  [name] is a 58 year old male admitted with subacute osteomyelitis status post incision and fixation of the right tibia/fibula after sustaining a fall from a ladder at the end of March.  Surgical specimens with cultures positive for Enterococcus faecalis, Staphylococcus epidermidis, and Pseudomonas Putida.  Has remained stable since most recent surgery with no fevers and improved symptoms.  Will require 6 weeks of IV antibiotics to treat osteomyelitis.  Subacute osteomyelitis of the right tibia -postop day 3 from repeat irrigation and debridement and doing well.  Cultures are polymicrobial as noted above.  Recommend 6 weeks of IV antibiotics with vancomycin, cefepime, and rifampin.  OPAT orders placed for home health. Discussed plan of care side effects of medications. Plan for follow up in 4 weeks or sooner if needed.  Diagnosis: Subacute osteomyelitis of the right tibia  Culture Result: Enterococcus faecalis, Staphylococcus epidermidis, Pseudomonas  No Known Allergies  OPAT Orders Discharge antibiotics: Cefepime, vancomycin, and rifampin Per pharmacy protocol Aim for Vancomycin trough 15-20 (unless otherwise indicated) Duration: 6 weeks End Date: 07/24/19  [facility] Care Per Protocol:  Labs weekly while on IV antibiotics: _X_ CBC with differential __ BMP (Biweekly) __ CMP _X_ CRP X__ ESR _X_ Vancomycin trough __ CK  _X_ Please pull PIC at completion of IV antibiotics __ Please leave PIC in place until doctor has seen patient or been notified  Fax weekly labs to [phone]  Clinic Follow Up Appt: 4 weeks 07/13/19 at 2:30 with Dr. Baxter Flattery   Active Problems:   Displaced pilon fracture of right tibia, initial encounter for open fracture type IIIA, IIIB, or IIIC   Subacute osteomyelitis of right tibia [facility])   Surgery, elective   Pilon  fracture of right tibia, sequela   . aspirin EC  81 mg Oral Daily  . docusate sodium  100 mg Oral BID  . gabapentin  300 mg Oral TID  . rifampin  300 mg Oral BID WC  . sertraline  150 mg Oral Daily  . sodium chloride flush  10-40 mL Intracatheter Q12H    SUBJECTIVE:  Afebrile overnight with no acute events.  Currently minimal pain in the right ankle and not requiring much pain medication.  Ready to go home.  No Known Allergies   Review of Systems: Review of Systems  Constitutional: Negative for chills, fever and weight loss.  Respiratory: Negative for cough, shortness of breath and wheezing.   Cardiovascular: Negative for chest pain and leg swelling.  Gastrointestinal: Negative for abdominal pain, constipation, diarrhea, nausea and vomiting.  Skin: Negative for rash.      OBJECTIVE: Vitals:   06/14/19 0741 06/14/19 1440 06/14/19 1924 06/15/19 0358  BP: 117/73 130/73 (!) 131/91 101/71  Pulse: 66 69 67 64  Resp: _0 Temp: 98.2 F (36.8 C) 98.6 F (37 C) 98.1 F (36.7 C) 98.2 F (36.8 C)  TempSrc: Oral Oral Oral Oral  SpO2: 97% 100% 99% 96%  Weight:      Height:       Body mass index is 26.51 kg/m.  Physical Exam Constitutional:      General: He is not in acute distress.    Appearance: He is well-developed.  Cardiovascular:     Rate and Rhythm: Normal rate and regular rhythm.     Heart sounds: Normal heart  sounds.  Pulmonary:     Effort: Pulmonary effort is normal.     Breath sounds: Normal breath sounds.  Musculoskeletal:     Comments: Right ankle surgical dressing with wound VAC in place and patent.  There is serosanguineous drainage in the canister.  Distal pulses and sensation are intact and appropriate.  Skin:    General: Skin is warm and dry.  Neurological:     Mental Status: He is alert and oriented to person, place, and time.  Psychiatric:        Mood and Affect: Mood normal.     Lab Results Lab Results  Component Value Date   WBC  6.4 05/08/2019   HGB 14.7 06/10/2019   HCT 41.9 05/08/2019   MCV 90.9 05/08/2019   PLT 238 05/08/2019    Lab Results  Component Value Date   CREATININE 1.04 06/14/2019   BUN 13 06/14/2019   NA 138 06/14/2019   K 3.6 06/14/2019   CL 108 06/14/2019   CO2 21 (L) 06/14/2019    Lab Results  Component Value Date   ALT 30 06/14/2019   AST 33 06/14/2019   ALKPHOS 82 06/14/2019   BILITOT 0.6 06/14/2019     Microbiology: Recent Results (from the past 240 hour(s))  SARS Coronavirus 2 (CEPHEID - Performed in [place] hospital lab), Hosp Order     Status: None   Collection Time: 06/10/19  6:50 AM   Specimen: Nasopharyngeal Swab  Result Value Ref Range Status   SARS Coronavirus 2 NEGATIVE NEGATIVE Final    Comment: (NOTE) If result is NEGATIVE SARS-CoV-2 target nucleic acids are NOT DETECTED. The SARS-CoV-2 RNA is generally detectable in upper and lower  respiratory specimens during the acute phase of infection. The lowest  concentration of SARS-CoV-2 viral copies this assay can detect is 250  copies / mL. A negative result does not preclude SARS-CoV-2 infection  and should not be used as the sole basis for treatment or other  patient management decisions.  A negative result may occur with  improper specimen collection / handling, submission of specimen other  than nasopharyngeal swab, presence of viral mutation(s) within the  areas targeted by this assay, and inadequate number of viral copies  (<250 copies / mL). A negative result must be combined with clinical  observations, patient history, and epidemiological information. If result is POSITIVE SARS-CoV-2 target nucleic acids are DETECTED. The SARS-CoV-2 RNA is generally detectable in upper and lower  respiratory specimens dur ing the acute phase of infection.  Positive  results are indicative of active infection with SARS-CoV-2.  Clinical  correlation with patient history and other diagnostic information is  necessary to  determine patient infection status.  Positive results do  not rule out bacterial infection or co-infection with other viruses. If result is PRESUMPTIVE POSTIVE SARS-CoV-2 nucleic acids MAY BE PRESENT.   A presumptive positive result was obtained on the submitted specimen  and confirmed on repeat testing.  While 2019 novel coronavirus  (SARS-CoV-2) nucleic acids may be present in the submitted sample  additional confirmatory testing may be necessary for epidemiological  and / or clinical management purposes  to differentiate between  SARS-CoV-2 and other Sarbecovirus currently known to infect humans.  If clinically indicated additional testing with an alternate test  methodology [phone]) is advised. The SARS-CoV-2 RNA is generally  detectable in upper and lower respiratory sp ecimens during the acute  phase of infection. The expected result is Negative. Fact Sheet for Patients:  StrictlyIdeas.no Fact Sheet for Healthcare Providers: BankingDealers.co.za This test is not yet approved or cleared by the [place] FDA and has been authorized for detection and/or diagnosis of SARS-CoV-2 by FDA under an Emergency Use Authorization (EUA).  This EUA will remain in effect (meaning this test can be used) for the duration of the COVID-19 declaration under Section 564(b)(1) of the Act, 21 U.S.C. section 360bbb-3(b)(1), unless the authorization is terminated or revoked sooner. Performed at [facility], [place] [address] [place], [place] [address]   Aerobic/Anaerobic Culture (surgical/deep wound)     Status: None (Preliminary result)   Collection Time: 06/10/19  9:47 AM   Specimen: Soft Tissue, Other  Result Value Ref Range Status   Specimen Description TISSUE RIGHT ANKLE  Final   Special Requests PATIENT ON FOLLOWING ANCEF  Final   Gram Stain   Final    RARE WBC PRESENT, PREDOMINANTLY PMN RARE GRAM POSITIVE COCCI    Culture   Final     FEW ENTEROCOCCUS FAECALIS MODERATE STAPHYLOCOCCUS EPIDERMIDIS SUSCEPTIBILITIES TO FOLLOW    Report Status PENDING  Incomplete   Organism ID, Bacteria STAPHYLOCOCCUS EPIDERMIDIS  Final      Susceptibility   Staphylococcus epidermidis - MIC*    CIPROFLOXACIN 2 INTERMEDIATE Intermediate     ERYTHROMYCIN <=0.25 SENSITIVE Sensitive     GENTAMICIN 4 SENSITIVE Sensitive     OXACILLIN >=4 RESISTANT Resistant     TETRACYCLINE >=16 RESISTANT Resistant     VANCOMYCIN 1 SENSITIVE Sensitive     TRIMETH/SULFA >=320 RESISTANT Resistant     CLINDAMYCIN <=0.25 SENSITIVE Sensitive     RIFAMPIN <=0.5 SENSITIVE Sensitive     Inducible Clindamycin Value in next row Sensitive      NEGATIVEPerformed at [facility] [address], [place], [place] [address]    * MODERATE STAPHYLOCOCCUS EPIDERMIDIS  Acid Fast Smear (AFB)     Status: None   Collection Time: 06/10/19  9:47 AM   Specimen: Soft Tissue, Other  Result Value Ref Range Status   AFB Specimen Processing Comment  Final    Comment: Tissue Grinding and Digestion/Decontamination   Acid Fast Smear Negative  Final    Comment: (NOTE) Performed At: [facility] [place], [place] [id number] Rush Farmer MD WU:[id number]    Source (AFB) TISSUE  Final    Comment: RIGHT ANKLE Performed at [facility], [place] [address], [place], [place] [address]   Aerobic/Anaerobic Culture (surgical/deep wound)     Status: None (Preliminary result)   Collection Time: 06/12/19  2:23 PM   Specimen: Soft Tissue, Other  Result Value Ref Range Status   Specimen Description TISSUE RIGHT TIBIA  Final   Special Requests A  Final   Gram Stain   Final    RARE WBC PRESENT, PREDOMINANTLY PMN NO ORGANISMS SEEN Performed at [facility], [place] [address], [place], [place] [address]    Culture   Final    RARE PSEUDOMONAS PUTIDA NO ANAEROBES ISOLATED; CULTURE IN PROGRESS FOR 5 DAYS    Report Status PENDING  Incomplete   Organism ID,  Bacteria PSEUDOMONAS PUTIDA  Final      Susceptibility   Pseudomonas putida - MIC*    CEFTAZIDIME 4 SENSITIVE Sensitive     CIPROFLOXACIN <=0.25 SENSITIVE Sensitive     GENTAMICIN <=1 SENSITIVE Sensitive     IMIPENEM <=0.25 SENSITIVE Sensitive     PIP/TAZO 16 SENSITIVE Sensitive     CEFEPIME 2 SENSITIVE Sensitive     *  RARE PSEUDOMONAS [place], [place] [facility] [phone] Pager  06/15/2019  12:21 PM

## 2019-06-15 NOTE — Progress Notes (Signed)
PHARMACY CONSULT NOTE FOR:  OUTPATIENT  PARENTERAL ANTIBIOTIC THERAPY (OPAT)  Indication: Polymicrobial Osteomyelitis of right tibia/fibula Regimen: Vancomycin 1000 mg every 12 hours IV + Cefepime 2 gm Q 8 hours IV + Rifampin 300 mg BID PO End date: 07/24/2019  IV antibiotic discharge orders are pended. To discharging provider:  please sign these orders via discharge navigator,  Select New Orders & click on the button choice - Manage This Unsigned Work.     Thank you for allowing pharmacy to be a part of this patient's care.  Jimmy Footman, PharmD, BCPS, BCIDP Infectious Diseases Clinical Pharmacist Phone: [phone] 06/15/2019, 11:12 AM

## 2019-06-15 NOTE — TOC Transition Note (Signed)
Transition of Care [facility]) - CM/SW Discharge Note   Patient Details  Name: Jonathan Bass MRN: [id number] Date of Birth: August 18, 1961  Transition of Care [facility]) CM/SW Contact:  Marilu Favre, RN Phone Number: 06/15/2019, 12:37 PM   Clinical Narrative:     See previous note.  Pam with [facility] and Ronalee Belts with [facility] both aware discharge is today . Requested Dr Sharol Given to order home health RN and sign OPAT script      Barriers to Discharge: No Barriers Identified   Patient Goals and CMS Choice     Choice offered to / list presented to : Spouse  Discharge Placement                       Discharge Plan and Services   Discharge Planning Services: CM Consult Post Acute Care Choice: Home Health          DME Arranged: N/A         HH Arranged: RN [place]: [place] Date [place]: 06/12/19 Time [place]: [place] Representative spoke with at [place]: [place] ([place]) Interventions     Readmission Risk Interventions No flowsheet data found.

## 2019-06-16 ENCOUNTER — Telehealth: Payer: Self-pay | Admitting: Orthopedic Surgery

## 2019-06-16 NOTE — Telephone Encounter (Signed)
Ok for [facility] and PT, labs are per infection disease, I do not know what they want

## 2019-06-16 NOTE — Telephone Encounter (Signed)
Jonathan Bass with [facility] called in requesting verbal order for home health for 2 times a week for 8 weeks and 2 times as needed. Also needs to know lab order dates if you can leave that on vm.   [phone]

## 2019-06-16 NOTE — Telephone Encounter (Signed)
I called and sw Almyra Free to advise of message below. To call with any questions.

## 2019-06-16 NOTE — Telephone Encounter (Signed)
Debridement and revision internal fixation pilon fracture on 06/12/19. The pt is calling and asking for lab results?

## 2019-06-16 NOTE — Telephone Encounter (Signed)
Second debridement cultures positive for pseudomonas

## 2019-06-16 NOTE — Telephone Encounter (Signed)
Wanted to call and give lab results for patient. Requesting a call back @ [phone]

## 2019-06-16 NOTE — Telephone Encounter (Signed)
Requesting lab order dates? Did you order routine labs to be ordered on this pt?

## 2019-06-17 ENCOUNTER — Telehealth: Payer: Self-pay | Admitting: Radiology

## 2019-06-17 LAB — AEROBIC/ANAEROBIC CULTURE W GRAM STAIN (SURGICAL/DEEP WOUND)

## 2019-06-17 NOTE — Telephone Encounter (Signed)
Jonathan Bass from [facility] called Patient refused PT eval order by Dr. Sharol Given, patient stated received PT in hospital and doesn't need anymore

## 2019-06-18 NOTE — Telephone Encounter (Signed)
Noted  

## 2019-06-22 ENCOUNTER — Ambulatory Visit (INDEPENDENT_AMBULATORY_CARE_PROVIDER_SITE_OTHER): Payer: 59

## 2019-06-22 ENCOUNTER — Other Ambulatory Visit: Payer: Self-pay

## 2019-06-22 ENCOUNTER — Inpatient Hospital Stay: Payer: 59 | Admitting: Orthopedic Surgery

## 2019-06-22 ENCOUNTER — Encounter: Payer: Self-pay | Admitting: Orthopedic Surgery

## 2019-06-22 ENCOUNTER — Ambulatory Visit (INDEPENDENT_AMBULATORY_CARE_PROVIDER_SITE_OTHER): Payer: 59 | Admitting: Physician Assistant

## 2019-06-22 VITALS — Ht 70.98 in | Wt 190.0 lb

## 2019-06-22 DIAGNOSIS — M25571 Pain in right ankle and joints of right foot: Secondary | ICD-10-CM

## 2019-06-22 DIAGNOSIS — S82871C Displaced pilon fracture of right tibia, initial encounter for open fracture type IIIA, IIIB, or IIIC: Secondary | ICD-10-CM

## 2019-06-22 MED ORDER — MUPIROCIN 2 % EX OINT: 1.0000 "application " | TOPICAL_OINTMENT | Freq: Every day | CUTANEOUS | 0 refills | Status: DC

## 2019-06-22 NOTE — Anesthesia Postprocedure Evaluation (Signed)
Anesthesia Post Note  Patient: Jonathan Bass  Procedure(s) Performed: DEBRIDEMENT AND REVISION INTERNAL FIXATION INFECTED RIGHT PILON FRACTURE (Right Leg Lower)     Patient location during evaluation: PACU Anesthesia Type: General Level of consciousness: awake and alert Pain management: pain level controlled Vital Signs Assessment: post-procedure vital signs reviewed and stable Respiratory status: spontaneous breathing, nonlabored ventilation, respiratory function stable and patient connected to nasal cannula oxygen Cardiovascular status: blood pressure returned to baseline and stable Postop Assessment: no apparent nausea or vomiting Anesthetic complications: no    Last Vitals:  Vitals:   06/14/19 1924 06/15/19 0358  BP: (!) 131/91 101/71  Pulse: 67 64  Resp: 18 16  Temp: 36.7 C 36.8 C  SpO2: 99% 96%    Last Pain:  Vitals:   06/15/19 1000  TempSrc:   PainSc: 0-No pain                 Dainelle Hun S

## 2019-06-22 NOTE — Progress Notes (Signed)
Office Visit Note   Patient: Jonathan Bass           Date of Birth: 10/14/1961           MRN: [id number] Visit Date: 06/22/2019              Requested by: Juluis RainierBarnes, Elizabeth, MD [address] [place],  [place] [address] PCP: Juluis RainierBarnes, Elizabeth, MD  Chief Complaint  Patient presents with  . Right Ankle - Routine Post Op    06/12/19 Deb & Revision Internal Fixation      HPI: The patient is a 58 yo gentleman who is seen for post operative follow up following Debridement and revision internal fixation of an infected right pilon ankle fracture on 06/12/2019 with VAC placement. 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 He is receiving IV Vancomycin and cefepime and po rifampin through 07/24/2019.  He reports little pain following the surgery and has not taken pain medication since he left the hospital. He is strict non weight bearing using a knee scooter.   Assessment & Plan: Visit Diagnoses:  1. Open pilon fracture, right, type III, initial encounter   2. Pain in right ankle and joints of right foot     Plan: Prevena VAC and splint removed. Ordered bactroban ointment to the open area over the right anterior ankle and dry gauze dressings to this area and incisions daily after washing area with soap and water. Strict non weight bearing in fracture boot using knee scooter. Plan to remove sutures in 2 weeks. Plan for medical compression sock in 2 weeks.  Follow up in 2 weeks with radiographs of the right ankle.   Follow-Up Instructions: Return in about 2 weeks (around 07/06/2019).   Ortho Exam  Patient is alert, oriented, no adenopathy, well-dressed, normal affect, normal respiratory effort. The splint and Prevena VAC were removed. There is moderate edema over the ankle and a small 2 x 1 cm superficial skin open area over the anterior ankle area, medial and lateral incisions are intact with scant serosanguineous drainage. No signs of celllulitis over the area.  Palpable pedal pulses. Sutures under tension.  Bactroban was applied to the open area and then gauze dressings to all areas and Ace wrap and fracture boot was applied.   Imaging: No results found. No images are attached to the encounter.  Labs: Lab Results  Component Value Date   REPTSTATUS 06/17/2019 FINAL 06/12/2019   GRAMSTAIN  06/12/2019    RARE WBC PRESENT, PREDOMINANTLY PMN NO ORGANISMS SEEN    CULT  06/12/2019    RARE PSEUDOMONAS PUTIDA NO ANAEROBES ISOLATED CRITICAL RESULT CALLED TO, READ BACK BY AND VERIFIED WITH: RN Gaynelle CageSABRINA S  [phone] AT 1315 BY CM Performed at [facility], [address] [place], [place] [address]    LABORGA PSEUDOMONAS PUTIDA 06/12/2019     Lab Results  Component Value Date   ALBUMIN 3.3 (L) 06/14/2019   ALBUMIN 4.0 03/11/2019   ALBUMIN 4.0 01/19/2012    No results found for: MG No results found for: VD25OH  No results found for: PREALBUMIN CBC EXTENDED Latest Ref Rng & Units 06/10/2019 05/08/2019 03/12/2019  WBC 4.0 - 10.5 K/uL - 6.4 14.0(H)  RBC 4.22 - 5.81 MIL/uL - 4.61 4.17(L)  HGB 13.0 - 17.0 g/dL 13.014.7 86.513.7 12.4(L)  HCT 39.0 - 52.0 % - 41.9 38.1(L)  PLT 150 - 400 K/uL - 238 206  NEUTROABS 1.7 - 7.7 K/uL - - -  LYMPHSABS 0.7 -  4.0 K/uL - - -     Body mass index is 26.51 kg/m.  Orders:  Orders Placed This Encounter  Procedures  . XR Ankle Complete Right   Meds ordered this encounter  Medications  . mupirocin ointment (BACTROBAN) 2 %    Sig: Apply 1 application topically daily. Apply to right ankle daily    Dispense:  22 g    Refill:  0     Procedures: No procedures performed  Clinical Data: No additional findings.  ROS:  All other systems negative, except as noted in the HPI. Review of Systems  Objective: Vital Signs: Ht 5' 10.98" (1.803 m)   Wt 190 lb (86.2 kg)   BMI 26.51 kg/m   Specialty Comments:  No specialty comments available.  PMFS History: Patient Active Problem List   Diagnosis Date  Noted  . Pilon fracture of right tibia, sequela 06/10/2019  . Displaced pilon fracture of right tibia, initial encounter for open fracture type IIIA, IIIB, or IIIC   . Subacute osteomyelitis of right tibia ([place])   . Surgery, elective   . Anxiety   . Open pilon fracture, right, type III, initial encounter 03/11/2019  . Closed fracture of right distal radius 03/11/2019  . Type III open pilon fracture of right tibia 03/11/2019  . Pounding heartbeat 01/21/2019  . Precordial chest pain 03/05/2014   Past Medical History:  Diagnosis Date  . Anxiety   . GERD (gastroesophageal reflux disease)    02/21/2019- not current   . Pulmonary nodule 10/2014   Noted on CT scan -> stable and follow-up 2019.  No further evaluation necessary.    Family History  Problem Relation Age of Onset  . Prostate cancer Father   . Valvular heart disease Father        Had a heart valve replaced (unsure of details)  . Breast cancer Mother   . Parkinson's disease Mother   . Healthy Sister   . Healthy Brother   . Parkinson's disease Maternal Grandmother   . Diabetes Maternal Grandfather     Past Surgical History:  Procedure Laterality Date  . APPLICATION OF WOUND VAC Right 06/10/2019   Procedure: Application Of Wound Vac -Prevena;  Surgeon: Newt Minion, MD;  Location: [facility];  Service: Orthopedics;  Laterality: Right;  . CLOSED REDUCTION WRIST FRACTURE Right 03/11/2019   Procedure: CLOSED REDUCTION WRIST;  Surgeon: Shona Needles, MD;  Location: [facility];  Service: Orthopedics;  Laterality: Right;  . COLONOSCOPY    . EXTERNAL FIXATION LEG Right 03/11/2019   Procedure: EXTERNAL FIXATION LEG;  Surgeon: Shona Needles, MD;  Location: [facility];  Service: Orthopedics;  Laterality: Right;  . EXTERNAL FIXATION REMOVAL Right 05/08/2019   Procedure: REMOVAL EXTERNAL FIXATION ANKLE, PLACEMENT OF CAST;  Surgeon: Shona Needles, MD;  Location: [facility];  Service: Orthopedics;  Laterality: Right;  . FOOT SURGERY Bilateral    hammer  toe  . HERNIA REPAIR Bilateral 2009   Inguinial  . I&D EXTREMITY Right 03/11/2019   Procedure: IRRIGATION AND DEBRIDEMENT EXTREMITY;  Surgeon: Shona Needles, MD;  Location: [facility];  Service: Orthopedics;  Laterality: Right;  . OPEN REDUCTION INTERNAL FIXATION (ORIF) TIBIA/FIBULA FRACTURE Right 06/10/2019   Procedure: EXCISION RIGHT TIBIA AND FIBULA, WOUND DEBRIDEMENT, AND FIXATION OF TIBIA AND FIBULA;  Surgeon: Newt Minion, MD;  Location: [facility];  Service: Orthopedics;  Laterality: Right;  . OPEN REDUCTION INTERNAL FIXATION (ORIF) TIBIA/FIBULA FRACTURE Right 06/12/2019   Procedure: DEBRIDEMENT AND REVISION  INTERNAL FIXATION INFECTED RIGHT PILON FRACTURE;  Surgeon: Duda, Marcus V, MD;  [facility];  Service: Orthopedics;  Laterality: Right;Nadara Mustard  . ORIF ANKLE FRACTURE Right 03/11/2019   Procedure: OPEN REDUCTION INTERNAL FIXATION (ORIF) ANKLE FRACTURE;  Surgeon: Roby LoftsHaddix, Kevin P, MD;  Location: [facility];  Service: Orthopedics;  Laterality: Right;  . ORIF WRIST FRACTURE Right 03/11/2019   Procedure: OPEN REDUCTION INTERNAL FIXATION (ORIF) WRIST FRACTURE;  Surgeon: Roby LoftsHaddix, Kevin P, MD;  Location: [facility];  Service: Orthopedics;  Laterality: Right;  . TONSILLECTOMY     Social History   Occupational History  . Occupation: Production designer, theatre/television/filmManager at [employer]    Employer: [employer]  Tobacco Use  . Smoking status: Never Smoker  . Smokeless tobacco: Never Used  Substance and Sexual Activity  . Alcohol use: No  . Drug use: No  . Sexual activity: Not on file

## 2019-06-22 NOTE — Telephone Encounter (Signed)
Pt has an appt today.  

## 2019-06-23 ENCOUNTER — Encounter: Payer: Self-pay | Admitting: Orthopedic Surgery

## 2019-07-01 ENCOUNTER — Telehealth: Payer: Self-pay | Admitting: Orthopedic Surgery

## 2019-07-01 NOTE — Telephone Encounter (Signed)
Joelene Millin from [facility] called stating patient only wants home health one time per week on Mondays before 7:30 am.  Joelene Millin is requesting a return call ASAP so she can set her schedule for tomorrow 07/02/19.  Please call back at [phone]

## 2019-07-01 NOTE — Telephone Encounter (Signed)
Ok, can oyu call her

## 2019-07-01 NOTE — Telephone Encounter (Signed)
VO was given for patient

## 2019-07-07 ENCOUNTER — Ambulatory Visit (INDEPENDENT_AMBULATORY_CARE_PROVIDER_SITE_OTHER): Payer: 59 | Admitting: Orthopedic Surgery

## 2019-07-07 ENCOUNTER — Encounter: Payer: Self-pay | Admitting: Orthopedic Surgery

## 2019-07-07 ENCOUNTER — Ambulatory Visit: Payer: Self-pay

## 2019-07-07 DIAGNOSIS — M25571 Pain in right ankle and joints of right foot: Secondary | ICD-10-CM | POA: Diagnosis not present

## 2019-07-07 DIAGNOSIS — S82871C Displaced pilon fracture of right tibia, initial encounter for open fracture type IIIA, IIIB, or IIIC: Secondary | ICD-10-CM

## 2019-07-07 DIAGNOSIS — M86261 Subacute osteomyelitis, right tibia and fibula: Secondary | ICD-10-CM

## 2019-07-07 NOTE — Progress Notes (Signed)
Office Visit Note   Patient: Jonathan Bass           Date of Birth: 12-Oct-1961           MRN: [id number] Visit Date: 07/07/2019              Requested by: Juluis RainierBarnes, Elizabeth, MD [address] [place],  [place] [address] PCP: Juluis RainierBarnes, Elizabeth, MD  Chief Complaint  Patient presents with  . Right Ankle - Routine Post Op    06/12/19 deb and revision internal fixation       HPI: Patient is a 58 year old gentleman who presents almost 4 weeks status post resection of the osteomyelitis of the tibia with closure of the necrotic wound.  Patient is currently on cefepime and vancomycin and right Pham pending for cultures that showed Pseudomonas.  Assessment & Plan: Visit Diagnoses:  1. Pain in right ankle and joints of right foot   2. Open pilon fracture, right, type III, initial encounter   3. Subacute osteomyelitis of right tibia [facility](HCC)     Plan: Patient will go to the medical supply store to obtain a size large medical compression stocking he will wear this around the clock change it daily.  Sutures harvested today.  Continue with the fracture boot continue nonweightbearing.  Repeat three-view radiographs at follow-up  Follow-Up Instructions: Return in about 2 weeks (around 07/21/2019).   Ortho Exam  Patient is alert, oriented, no adenopathy, well-dressed, normal affect, normal respiratory effort. Examination patient's surgical wounds and closure of the large traumatic wound have healed nicely.  He does have 1 area of granulation tissue distal to the traumatic wound and this has 50% granulation tissue.  There is no redness no cellulitis no signs of infection.  His ankle is at 90 degrees.  Imaging: Xr Ankle Complete Right  Result Date: 07/07/2019 3 view radiographs of the right ankle shows stable internal fixation with no hardware failure no complicating features no callus formation yet     Labs: Lab Results  Component Value Date   REPTSTATUS 06/17/2019 FINAL 06/12/2019   GRAMSTAIN  06/12/2019    RARE WBC PRESENT, PREDOMINANTLY PMN NO ORGANISMS SEEN    CULT  06/12/2019    RARE PSEUDOMONAS PUTIDA NO ANAEROBES ISOLATED CRITICAL RESULT CALLED TO, READ BACK BY AND VERIFIED WITH: RN Gaynelle CageSABRINA S  [id number] AT 1315 BY CM Performed at [facility], [address] [place], [place] [address]    LABORGA PSEUDOMONAS PUTIDA 06/12/2019     Lab Results  Component Value Date   ALBUMIN 3.3 (L) 06/14/2019   ALBUMIN 4.0 03/11/2019   ALBUMIN 4.0 01/19/2012    No results found for: MG No results found for: VD25OH  No results found for: PREALBUMIN CBC EXTENDED Latest Ref Rng & Units 06/10/2019 05/08/2019 03/12/2019  WBC 4.0 - 10.5 K/uL - 6.4 14.0(H)  RBC 4.22 - 5.81 MIL/uL - 4.61 4.17(L)  HGB 13.0 - 17.0 g/dL 13.014.7 86.513.7 12.4(L)  HCT 39.0 - 52.0 % - 41.9 38.1(L)  PLT 150 - 400 K/uL - 238 206  NEUTROABS 1.7 - 7.7 K/uL - - -  LYMPHSABS 0.7 - 4.0 K/uL - - -     There is no height or weight on file to calculate BMI.  Orders:  Orders Placed This Encounter  Procedures  . XR Ankle Complete Right   No orders of the defined types were placed in this encounter.    Procedures: No procedures performed  Clinical Data: No additional findings.  ROS:  All other systems negative, except as noted in the HPI. Review of Systems  Objective: Vital Signs: There were no vitals taken for this visit.  Specialty Comments:  No specialty comments available.  PMFS History: Patient Active Problem List   Diagnosis Date Noted  . Pilon fracture of right tibia, sequela 06/10/2019  . Displaced pilon fracture of right tibia, initial encounter for open fracture type IIIA, IIIB, or IIIC   . Subacute osteomyelitis of right tibia (HCC)   . Surgery, elective   . Anxiety   . Open pilon fracture, right, type III, initial encounter 03/11/2019  . Closed fracture of right distal radius 03/11/2019  . Type III open pilon fracture of right tibia 03/11/2019  . Pounding heartbeat  01/21/2019  . Precordial chest pain 03/05/2014   Past Medical History:  Diagnosis Date  . Anxiety   . GERD (gastroesophageal reflux disease)    02/21/2019- not current   . Pulmonary nodule 10/2014   Noted on CT scan -> stable and follow-up 2019.  No further evaluation necessary.    Family History  Problem Relation Age of Onset  . Prostate cancer Father   . Valvular heart disease Father        Had a heart valve replaced (unsure of details)  . Breast cancer Mother   . Parkinson's disease Mother   . Healthy Sister   . Healthy Brother   . Parkinson's disease Maternal Grandmother   . Diabetes Maternal Grandfather     Past Surgical History:  Procedure Laterality Date  . APPLICATION OF WOUND VAC Right 06/10/2019   Procedure: Application Of Wound Vac -Prevena;  Surgeon: Nadara Mustarduda,  V, MD;  [facility];  Service: Orthopedics;  Laterality: Right;  . CLOSED REDUCTION WRIST FRACTURE Right 03/11/2019   Procedure: CLOSED REDUCTION WRIST;  Surgeon: Roby LoftsHaddix, Kevin P, MD;  Location[facility] OR;  Service: Orthopedics;  Laterality: Right;  . COLONOSCOPY    . EXTERNAL FIXATION LEG Right 03/11/2019   Procedure: EXTERNAL FIXATION LEG;  Surgeon: Roby LoftsHaddix, Kevin P, MD;  Location: [facility] OR;  Service: Orthopedics;  Laterality: Right;  . EXTERNAL FIXATION REMOVAL Right 05/08/2019   Procedure: REMOVAL EXTERNAL FIXATION ANKLE, PLACEMENT OF CAST;  Surgeon: Roby LoftsHaddix, Kevin P, MD;  Location: [facility] OR;  Service: Orthopedics;  Laterality: Right;  . FOOT SURGERY Bilateral    hammer toe  . HERNIA REPAIR Bilateral 2009   Inguinial  . I&D EXTREMITY Right 03/11/2019   Procedure: IRRIGATION AND DEBRIDEMENT EXTREMITY;  Surgeon: Roby LoftsHaddix, Kevin P, MD;  Location[facility] OR;  Service: Orthopedics;  Laterality: Right;  . OPEN REDUCTION INTERNAL FIXATION (ORIF) TIBIA/FIBULA FRACTURE Right 06/10/2019   Procedure: EXCISION RIGHT TIBIA AND FIBULA, WOUND DEBRIDEMENT, AND FIXATION OF TIBIA AND FIBULA;  Surgeon: Nadara Mustarduda,  V, MD;  [facility];   Service: Orthopedics;  Laterality: Right;  . OPEN REDUCTION INTERNAL FIXATION (ORIF) TIBIA/FIBULA FRACTURE Right 06/12/2019   Procedure: DEBRIDEMENT AND REVISION INTERNAL FIXATION INFECTED RIGHT PILON FRACTURE;  Surgeon: Nadara Mustarduda,  V, MD;  [facility];  Service: Orthopedics;  Laterality: Right;  . ORIF ANKLE FRACTURE Right 03/11/2019   Procedure: OPEN REDUCTION INTERNAL FIXATION (ORIF) ANKLE FRACTURE;  Surgeon: Roby LoftsHaddix, Kevin P, MD;  [facility];  Service: Orthopedics;  Laterality: Right;  . ORIF WRIST FRACTURE Right 03/11/2019   Procedure: OPEN REDUCTION INTERNAL FIXATION (ORIF) WRIST FRACTURE;  Surgeon: Roby LoftsHaddix, Kevin P, MD;  [facility];  Service: Orthopedics;  Laterality: Right;  . TONSILLECTOMY     Social History  Occupational History  . Occupation: Freight forwarder at [employer]: [place]  Tobacco Use  . Smoking status: Never Smoker  . Smokeless tobacco: Never Used  Substance and Sexual Activity  . Alcohol use: No  . Drug use: No  . Sexual activity: Not on file

## 2019-07-09 ENCOUNTER — Other Ambulatory Visit: Payer: Self-pay

## 2019-07-09 ENCOUNTER — Telehealth: Payer: Self-pay

## 2019-07-09 ENCOUNTER — Emergency Department (HOSPITAL_COMMUNITY): Payer: 59

## 2019-07-09 ENCOUNTER — Emergency Department (HOSPITAL_COMMUNITY)
Admission: EM | Admit: 2019-07-09 | Discharge: 2019-07-09 | Disposition: A | Discharge status: Home / Self Care | Payer: 59 | Attending: Emergency Medicine | Admitting: Emergency Medicine

## 2019-07-09 ENCOUNTER — Encounter (HOSPITAL_COMMUNITY): Payer: Self-pay | Admitting: Emergency Medicine

## 2019-07-09 DIAGNOSIS — Z7982 Long term (current) use of aspirin: Secondary | ICD-10-CM | POA: Diagnosis not present

## 2019-07-09 DIAGNOSIS — Z4589 Encounter for adjustment and management of other implanted devices: Secondary | ICD-10-CM

## 2019-07-09 DIAGNOSIS — Y711 Therapeutic (nonsurgical) and rehabilitative cardiovascular devices associated with adverse incidents: Secondary | ICD-10-CM | POA: Insufficient documentation

## 2019-07-09 DIAGNOSIS — T82898A Other specified complication of vascular prosthetic devices, implants and grafts, initial encounter: Secondary | ICD-10-CM | POA: Insufficient documentation

## 2019-07-09 DIAGNOSIS — B999 Unspecified infectious disease: Secondary | ICD-10-CM

## 2019-07-09 DIAGNOSIS — Z20828 Contact with and (suspected) exposure to other viral communicable diseases: Secondary | ICD-10-CM | POA: Diagnosis not present

## 2019-07-09 DIAGNOSIS — Z79899 Other long term (current) drug therapy: Secondary | ICD-10-CM | POA: Diagnosis not present

## 2019-07-09 HISTORY — PX: IR FLUORO GUIDE CV LINE RIGHT: IMG2283

## 2019-07-09 LAB — SARS CORONAVIRUS 2 BY RT PCR (HOSPITAL ORDER, PERFORMED IN ~~LOC~~ HOSPITAL LAB): SARS Coronavirus 2: NEGATIVE

## 2019-07-09 MED ORDER — LIDOCAINE HCL 1 % IJ SOLN
INTRAMUSCULAR | Status: AC
  Filled 2019-07-09: qty 20

## 2019-07-09 NOTE — Telephone Encounter (Signed)
Spoke with Dr Baxter Flattery.  Patient should hold IV antibiotics, needs IR PICC evaluation today/tomorrow.  No IR availability until next week at [facility] or [facility].  Patient will need to have this evaluated at ER today. Patient notified, agreed.

## 2019-07-09 NOTE — ED Notes (Signed)
Patient back from IR. VSS. Patient denies pain at this time. Dr. Stark Jock made aware of patient return.

## 2019-07-09 NOTE — Discharge Instructions (Addendum)
Your PICC line is ready for immediate use.  Continue medications as before and follow-up with your primary doctor as needed.

## 2019-07-09 NOTE — Telephone Encounter (Signed)
Patient called office today stating his picc line is longer. Home health measured picc line and it is 6 cm longer than before. RN did not draw any labs during today's visit. Patient states he noticed on 7/22 picc line was making a gurgling sound. Denies any redness,swelling, irritation at picc line sight. Will forward to Dr. Baxter Flattery to advise on next steps for patient. [place]

## 2019-07-09 NOTE — Procedures (Signed)
Interventional Radiology Procedure Note  Procedure: Exchange for a new RUE SL PowerPICC, 42 cm.  Catheter tip at the cavoatrial junction and ready for use.   Complications: None  Estimated Blood Loss: None  Recommendations: - Ready for use  Signed,  Criselda Peaches, MD

## 2019-07-09 NOTE — ED Triage Notes (Signed)
Patient reports he has been getting abx and blood drawn from PICC since June after infection in compound fracture. States yesterday noticed "gurgling" when flushing line and line longer than normal. Reports sent by ID physician to verify placement.

## 2019-07-09 NOTE — ED Provider Notes (Signed)
[facility] DEPT Provider Note   CSN: [id number] Arrival date & time: 07/09/19  1257     History   Chief Complaint Chief Complaint  Patient presents with   Vascular Access Problem    HPI DYSHAUN BONZO is a 58 y.o. male.     HPI   Patient is here for concern about dysfunction of his PICC line.  He is currently doing 3 infusions a day, antibiotics for leg infection.  Yesterday while flushing the catheter he noticed "a gurgling sound, near his right proximal humerus.  The PICC line was otherwise working normally.  He did not use it today.  A home health nurse was at his home today, and told him that the PICC line looked like it had been pulled out about 6 cm.  He contacted his ID physician who recommended they come here for assessment.  He denies chest pain, shortness of breath, fever, chills, nausea, vomiting, weakness or dizziness.  There are no other known modifying factors.  Past Medical History:  Diagnosis Date   Anxiety    GERD (gastroesophageal reflux disease)    02/21/2019- not current    Pulmonary nodule 10/2014   Noted on CT scan -> stable and follow-up 2019.  No further evaluation necessary.    Patient Active Problem List   Diagnosis Date Noted   Pilon fracture of right tibia, sequela 06/10/2019   Displaced pilon fracture of right tibia, initial encounter for open fracture type IIIA, IIIB, or IIIC    Subacute osteomyelitis of right tibia [facility])    Surgery, elective    Anxiety    Open pilon fracture, right, type III, initial encounter 03/11/2019   Closed fracture of right distal radius 03/11/2019   Type III open pilon fracture of right tibia 03/11/2019   Pounding heartbeat 01/21/2019   Precordial chest pain 03/05/2014    Past Surgical History:  Procedure Laterality Date   APPLICATION OF WOUND VAC Right 06/10/2019   Procedure: Application Of Wound Vac -Prevena;  Surgeon: Newt Minion, MD;  Location: [facility];  Service:  Orthopedics;  Laterality: Right;   CLOSED REDUCTION WRIST FRACTURE Right 03/11/2019   Procedure: CLOSED REDUCTION WRIST;  Surgeon: Shona Needles, MD;  Location: [facility];  Service: Orthopedics;  Laterality: Right;   COLONOSCOPY     EXTERNAL FIXATION LEG Right 03/11/2019   Procedure: EXTERNAL FIXATION LEG;  Surgeon: Shona Needles, MD;  Location: [place];  Service: Orthopedics;  Laterality: Right;   EXTERNAL FIXATION REMOVAL Right 05/08/2019   Procedure: REMOVAL EXTERNAL FIXATION ANKLE, PLACEMENT OF CAST;  Surgeon: Shona Needles, MD;  Location: [facility];  Service: Orthopedics;  Laterality: Right;   FOOT SURGERY Bilateral    hammer toe   HERNIA REPAIR Bilateral 2009   Inguinial   I&D EXTREMITY Right 03/11/2019   Procedure: IRRIGATION AND DEBRIDEMENT EXTREMITY;  Surgeon: Shona Needles, MD;  Location: [place];  Service: Orthopedics;  Laterality: Right;   OPEN REDUCTION INTERNAL FIXATION (ORIF) TIBIA/FIBULA FRACTURE Right 06/10/2019   Procedure: EXCISION RIGHT TIBIA AND FIBULA, WOUND DEBRIDEMENT, AND FIXATION OF TIBIA AND FIBULA;  Surgeon: Newt Minion, MD;  Location: [place];  Service: Orthopedics;  Laterality: Right;   OPEN REDUCTION INTERNAL FIXATION (ORIF) TIBIA/FIBULA FRACTURE Right 06/12/2019   Procedure: DEBRIDEMENT AND REVISION INTERNAL FIXATION INFECTED RIGHT PILON FRACTURE;  Surgeon: Newt Minion, MD;  Location: [facility];  Service: Orthopedics;  Laterality: Right;   ORIF ANKLE FRACTURE Right 03/11/2019   Procedure:  OPEN REDUCTION INTERNAL FIXATION (ORIF) ANKLE FRACTURE;  Surgeon: Shona Needles, MD;  Location: [place];  Service: Orthopedics;  Laterality: Right;   ORIF WRIST FRACTURE Right 03/11/2019   Procedure: OPEN REDUCTION INTERNAL FIXATION (ORIF) WRIST FRACTURE;  Surgeon: Shona Needles, MD;  Location: [place];  Service: Orthopedics;  Laterality: Right;   TONSILLECTOMY          Home Medications    Prior to Admission medications   Medication Sig Start Date End Date Taking?  Authorizing Provider  acetaminophen (TYLENOL) 325 MG tablet Take 650 mg by mouth 2 (two) times daily as needed (pain.).    [provider]  aspirin EC 81 MG tablet Take 81 mg by mouth daily.    [provider]  ceFEPime (MAXIPIME) IVPB Inject 2 g into the vein every 8 (eight) hours. Indication:  Polymicrobial osteomyelitis of right tibia/fibula Last Day of Therapy:  07/24/2019 Labs - Once weekly:  CBC/D and BMP, Labs - Every other week:  ESR and CRP 06/15/19 07/24/19  Golden Circle, FNP  Cholecalciferol (VITAMIN D3) 125 MCG (5000 UT) TABS Take 1 tablet (5,000 Units total) by mouth daily. 03/14/19   Ainsley Spinner, PA-C  gabapentin (NEURONTIN) 300 MG capsule Take 1 capsule (300 mg total) by mouth 3 (three) times daily. 03/14/19   Ainsley Spinner, PA-C  HYDROcodone-acetaminophen (NORCO/VICODIN) 5-325 MG tablet Take 1 tablet by mouth every 4 (four) hours as needed. 06/15/19   Newt Minion, MD  mupirocin ointment (BACTROBAN) 2 % Apply 1 application topically daily. Apply to right ankle daily 06/22/19   Rayburn, Neta Mends, PA-C  propranolol (INDERAL) 10 MG tablet Take 10 mg by mouth 2 (two) times daily as needed (anxiety).     [provider]  rifampin (RIFADIN) 300 MG capsule Take 1 capsule (300 mg total) by mouth 2 (two) times daily. 06/15/19 07/24/19  Golden Circle, FNP  sertraline (ZOLOFT) 100 MG tablet Take 150 mg by mouth daily.     [provider]  vancomycin IVPB Inject 1,000 mg into the vein every 12 (twelve) hours. Indication:  Polymicrobial osteomyelitis of right tibia/fibula Last Day of Therapy:  07/24/2019 Labs - Sunday/Monday:  CBC/D, BMP, and vancomycin trough. Labs - Thursday:  CMP and vancomycin trough Labs - Every other week:  ESR and CRP 06/15/19 07/24/19  Golden Circle, FNP  vitamin C (VITAMIN C) 500 MG tablet Take 1 tablet (500 mg total) by mouth daily. 03/14/19   Ainsley Spinner, PA-C    Family History Family History  Problem Relation Age of Onset     Prostate cancer Father    Valvular heart disease Father        Had a heart valve replaced (unsure of details)   Breast cancer Mother    Parkinson's disease Mother    Healthy Sister    Healthy Brother    Parkinson's disease Maternal Grandmother    Diabetes Maternal Grandfather     Social History Social History   Tobacco Use   Smoking status: Never Smoker   Smokeless tobacco: Never Used  Substance Use Topics   Alcohol use: No   Drug use: No     Allergies   Patient has no known allergies.   Review of Systems Review of Systems  All other systems reviewed and are negative.    Physical Exam Updated Vital Signs BP (!) 156/96 (BP Location: Left Arm)    Pulse 75    Temp 98.3 F (36.8 C) (Oral)  Resp 18    SpO2 97%   Physical Exam Vitals signs and nursing note reviewed.  Constitutional:      Appearance: He is well-developed.  HENT:     Head: Normocephalic and atraumatic.     Right Ear: External ear normal.     Left Ear: External ear normal.  Eyes:     Conjunctiva/sclera: Conjunctivae normal.     Pupils: Pupils are equal, round, and reactive to light.  Neck:     Musculoskeletal: Normal range of motion and neck supple.     Trachea: Phonation normal.  Cardiovascular:     Rate and Rhythm: Normal rate.     Comments: PICC line, right upper arm, appears to be in the basilic vein.  The device appears normal, externally, it may be longer than typical, and the area around the insertion into the skin and proximally are nontender, without erythema, and there is no swelling. Pulmonary:     Effort: Pulmonary effort is normal. No respiratory distress.     Breath sounds: No stridor.  Musculoskeletal: Normal range of motion.  Skin:    General: Skin is warm and dry.  Neurological:     Mental Status: He is alert and oriented to person, place, and time.     Cranial Nerves: No cranial nerve deficit.     Sensory: No sensory deficit.     Motor: No abnormal muscle  tone.     Coordination: Coordination normal.  Psychiatric:        Mood and Affect: Mood normal.        Behavior: Behavior normal.        Thought Content: Thought content normal.        Judgment: Judgment normal.      ED Treatments / Results  Labs (all labs ordered are listed, but only abnormal results are displayed) Labs Reviewed - No data to display  EKG None  Radiology Xr Ankle Complete Right  Result Date: 07/07/2019 3 view radiographs of the right ankle shows stable internal fixation with no hardware failure no complicating features no callus formation yet   Procedures Procedures (including critical care time)  Medications Ordered in ED Medications - No data to display   Initial Impression / Assessment and Plan / ED Course  I have reviewed the triage vital signs and the nursing notes.  Pertinent labs & imaging results that were available during my care of the patient were reviewed by me and considered in my medical decision making (see chart for details).         Patient Vitals for the past 24 hrs:  BP Temp Temp src Pulse Resp SpO2  07/09/19 1306 (!) 156/96 98.3 F (36.8 C) Oral 75 18 97 %    Consult interventional radiology to replace PICC   Medical Decision Making: Malfunction PICC line right arm, it has been partially pulled out and needs to be replaced for ongoing management of infected right leg.  CRITICAL CARE-no Performed by: Daleen Bo  Nursing Notes Reviewed/ Care Coordinated Applicable Imaging Reviewed Interpretation of Laboratory Data incorporated into ED treatment   Plan-as per oncoming provider team in conjunction with interventional radiology  Final Clinical Impressions(s) / ED Diagnoses   Final diagnoses:  Encounter for management of peripherally inserted central catheter (PICC)    ED Discharge Orders    None       Daleen Bo, MD 07/09/19 1507

## 2019-07-10 ENCOUNTER — Telehealth: Payer: Self-pay | Admitting: Orthopedic Surgery

## 2019-07-10 ENCOUNTER — Encounter: Payer: Self-pay | Admitting: Orthopedic Surgery

## 2019-07-10 LAB — FUNGUS CULTURE WITH STAIN

## 2019-07-10 LAB — FUNGAL ORGANISM REFLEX

## 2019-07-10 LAB — FUNGUS CULTURE RESULT

## 2019-07-10 NOTE — Telephone Encounter (Signed)
FYI:  Diana/[facility] called to state that didn't do PTE on patient as she called and no answer. Last she heard patient went to ED.  [phone]

## 2019-07-11 NOTE — Telephone Encounter (Signed)
Let's follow up on this.

## 2019-07-13 ENCOUNTER — Other Ambulatory Visit: Payer: Self-pay

## 2019-07-13 ENCOUNTER — Ambulatory Visit: Payer: 59 | Admitting: Internal Medicine

## 2019-07-13 VITALS — BP 155/79 | HR 79 | Temp 97.8°F | Wt 195.0 lb

## 2019-07-13 DIAGNOSIS — M86261 Subacute osteomyelitis, right tibia and fibula: Secondary | ICD-10-CM

## 2019-07-13 NOTE — Telephone Encounter (Signed)
I called pt and he states that he is feeling fine that he had his picc line replaced and that there have not been any other problems.

## 2019-07-13 NOTE — Telephone Encounter (Signed)
Call patient and make sure he is having no problems

## 2019-07-13 NOTE — Telephone Encounter (Signed)
Looks like pt went to ER for PICC line issues. He has a follow up with you on 07/21/19.

## 2019-07-13 NOTE — Progress Notes (Signed)
RFV: follow up for subacute osteo of ankle,  Patient ID: Jonathan Bass, male   DOB: 1961/01/12, 58 y.o.   MRN: [id number]  HPI 58yo m with polymicrobial right ankle/foot osteo and Slated to be on iv abtx through August 7th, had difficulty with his picc line migrating required to be replaced this past week. Outpatient Encounter Medications as of 07/13/2019  Medication Sig  . aspirin EC 81 MG tablet Take 81 mg by mouth daily.  Marland Kitchen ceFEPime (MAXIPIME) IVPB Inject 2 g into the vein every 8 (eight) hours. Indication:  Polymicrobial osteomyelitis of right tibia/fibula Last Day of Therapy:  07/24/2019 Labs - Once weekly:  CBC/D and BMP, Labs - Every other week:  ESR and CRP  . Cholecalciferol (VITAMIN D3) 125 MCG (5000 UT) TABS Take 1 tablet (5,000 Units total) by mouth daily.  . propranolol (INDERAL) 10 MG tablet Take 10 mg by mouth 2 (two) times daily as needed (anxiety).   . rifampin (RIFADIN) 300 MG capsule Take 1 capsule (300 mg total) by mouth 2 (two) times daily.  . sertraline (ZOLOFT) 100 MG tablet Take 150 mg by mouth daily.   . vancomycin IVPB Inject 1,000 mg into the vein every 12 (twelve) hours. Indication:  Polymicrobial osteomyelitis of right tibia/fibula Last Day of Therapy:  07/24/2019 Labs - Sunday/Monday:  CBC/D, BMP, and vancomycin trough. Labs - Thursday:  CMP and vancomycin trough Labs - Every other week:  ESR and CRP  . vitamin C (VITAMIN C) 500 MG tablet Take 1 tablet (500 mg total) by mouth daily.  Marland Kitchen gabapentin (NEURONTIN) 300 MG capsule Take 1 capsule (300 mg total) by mouth 3 (three) times daily. (Patient not taking: Reported on 07/09/2019)  . HYDROcodone-acetaminophen (NORCO/VICODIN) 5-325 MG tablet Take 1 tablet by mouth every 4 (four) hours as needed. (Patient not taking: Reported on 07/09/2019)  . mupirocin ointment (BACTROBAN) 2 % Apply 1 application topically daily. Apply to right ankle daily (Patient not taking: Reported on 07/09/2019)  . [DISCONTINUED] acetaminophen  (TYLENOL) 325 MG tablet Take 650 mg by mouth 2 (two) times daily as needed (pain.).   No facility-administered encounter medications on file as of 07/13/2019.      Patient Active Problem List   Diagnosis Date Noted  . Pilon fracture of right tibia, sequela 06/10/2019  . Displaced pilon fracture of right tibia, initial encounter for open fracture type IIIA, IIIB, or IIIC   . Subacute osteomyelitis of right tibia ([place])   . Surgery, elective   . Anxiety   . Open pilon fracture, right, type III, initial encounter 03/11/2019  . Closed fracture of right distal radius 03/11/2019  . Type III open pilon fracture of right tibia 03/11/2019  . Pounding heartbeat 01/21/2019  . Precordial chest pain 03/05/2014     Health Maintenance Due  Topic Date Due  . Hepatitis C Screening  03[birth year]  . COLONOSCOPY  06/10/2009     Review of Systems  Physical Exam   BP (!) 155/79   Pulse 79   Temp 97.8 F (36.6 C) (Oral)   Wt 195 lb (88.5 kg)   BMI 27.21 kg/m    Physical Exam  Constitutional: He is oriented to person, place, and time. He appears well-developed and well-nourished. No distress.  HENT:  Mouth/Throat: Oropharynx is clear and moist. No oropharyngeal exudate.  Ext: foot in cast. picc line is c/d/i Skin: Skin is warm and dry. No rash noted. No erythema.  Psychiatric: He has a normal mood and affect. His  behavior is normal.    CBC Lab Results  Component Value Date   WBC 6.4 05/08/2019   RBC 4.61 05/08/2019   HGB 14.7 06/10/2019   HCT 41.9 05/08/2019   PLT 238 05/08/2019   MCV 90.9 05/08/2019   MCH 29.7 05/08/2019   MCHC 32.7 05/08/2019   RDW 13.2 05/08/2019   LYMPHSABS 1.8 03/11/2019   MONOABS 0.5 03/11/2019   EOSABS 0.1 03/11/2019    BMET Lab Results  Component Value Date   NA 138 06/14/2019   K 3.6 06/14/2019   CL 108 06/14/2019   CO2 21 (L) 06/14/2019   GLUCOSE 140 (H) 06/14/2019   BUN 13 06/14/2019   CREATININE 1.04 06/14/2019   CALCIUM 8.9 06/14/2019    GFRNONAA >60 06/14/2019   GFRAA >60 06/14/2019      Assessment and Plan Osteo of foot/ankle=  He finishes on 8/7. Will check sed rate and crp to decide if need to give oral abtx thereafter. Healing well. Will do televisit in 11 d

## 2019-07-21 ENCOUNTER — Ambulatory Visit (INDEPENDENT_AMBULATORY_CARE_PROVIDER_SITE_OTHER): Payer: 59

## 2019-07-21 ENCOUNTER — Ambulatory Visit (INDEPENDENT_AMBULATORY_CARE_PROVIDER_SITE_OTHER): Payer: 59 | Admitting: Orthopedic Surgery

## 2019-07-21 ENCOUNTER — Encounter: Payer: Self-pay | Admitting: Orthopedic Surgery

## 2019-07-21 VITALS — Ht 70.0 in | Wt 195.0 lb

## 2019-07-21 DIAGNOSIS — S82871C Displaced pilon fracture of right tibia, initial encounter for open fracture type IIIA, IIIB, or IIIC: Secondary | ICD-10-CM | POA: Diagnosis not present

## 2019-07-21 NOTE — Progress Notes (Signed)
Office Visit Note   Patient: Jonathan Bass           Date of Birth: 02/22/1961           MRN: [id number] Visit Date: 07/21/2019              Requested by: Juluis RainierBarnes, Elizabeth, MD [address] [place],  [place] [address] PCP: Juluis RainierBarnes, Elizabeth, MD  Chief Complaint  Patient presents with  . Right Ankle - Routine Post Op    06/12/19 right ankle debridement and revision internal fixation       HPI: Patient is a 58 year old gentleman status post limb salvage intervention for excision of osteomyelitis and the necrotic wound from the open fracture as well as internal fixation of the pilon fracture.  Patient states he still has some swelling he is nonweightbearing with a kneeling scooter wearing his medical compression stocking.   Assessment & Plan: Visit Diagnoses:  1. Open pilon fracture, right, type III, initial encounter     Plan: Patient will continue nonweightbearing he may begin exercising with his upper extremities as well as the left lower extremity he will finish his antibiotics on Friday and have the PICC line removed.  Repeat three-view radiographs of the right ankle at follow-up.  Follow-Up Instructions: Return in about 3 weeks (around 08/11/2019).   Ortho Exam  Patient is alert, oriented, no adenopathy, well-dressed, normal affect, normal respiratory effort. Examination the traumatic wound is healing rapidly with wearing the medical compression stocking.  The previous open necrotic wound has completely healed.  He does have venous and lymphatic swelling.  The surgical incisions are well-healed.  No cellulitis no drainage.  Imaging: Xr Ankle Complete Right  Result Date: 07/21/2019 3 view radiographs of the right ankle shows early callus formation at the fusion site no loss of reduction.  No images are attached to the encounter.  Labs: Lab Results  Component Value Date   REPTSTATUS 06/17/2019 FINAL 06/12/2019   GRAMSTAIN  06/12/2019    RARE WBC PRESENT,  PREDOMINANTLY PMN NO ORGANISMS SEEN    CULT  06/12/2019    RARE PSEUDOMONAS PUTIDA NO ANAEROBES ISOLATED CRITICAL RESULT CALLED TO, READ BACK BY AND VERIFIED WITH: RN Gaynelle CageSABRINA S  [phone] AT 1315 BY CM Performed at [facility], [address] [place], [place] [address]    LABORGA PSEUDOMONAS PUTIDA 06/12/2019     Lab Results  Component Value Date   ALBUMIN 3.3 (L) 06/14/2019   ALBUMIN 4.0 03/11/2019   ALBUMIN 4.0 01/19/2012    No results found for: MG No results found for: VD25OH  No results found for: PREALBUMIN CBC EXTENDED Latest Ref Rng & Units 06/10/2019 05/08/2019 03/12/2019  WBC 4.0 - 10.5 K/uL - 6.4 14.0(H)  RBC 4.22 - 5.81 MIL/uL - 4.61 4.17(L)  HGB 13.0 - 17.0 g/dL 24.414.7 01.013.7 12.4(L)  HCT 39.0 - 52.0 % - 41.9 38.1(L)  PLT 150 - 400 K/uL - 238 206  NEUTROABS 1.7 - 7.7 K/uL - - -  LYMPHSABS 0.7 - 4.0 K/uL - - -     Body mass index is 27.98 kg/m.  Orders:  Orders Placed This Encounter  Procedures  . XR Ankle Complete Right   No orders of the defined types were placed in this encounter.    Procedures: No procedures performed  Clinical Data: No additional findings.  ROS:  All other systems negative, except as noted in the HPI. Review of Systems  Objective: Vital Signs: Ht 5\' 10"  (1.778  m)   Wt 195 lb (88.5 kg)   BMI 27.98 kg/m   Specialty Comments:  No specialty comments available.  PMFS History: Patient Active Problem List   Diagnosis Date Noted  . Pilon fracture of right tibia, sequela 06/10/2019  . Displaced pilon fracture of right tibia, initial encounter for open fracture type IIIA, IIIB, or IIIC   . Subacute osteomyelitis of right tibia ([place])   . Surgery, elective   . Anxiety   . Open pilon fracture, right, type III, initial encounter 03/11/2019  . Closed fracture of right distal radius 03/11/2019  . Type III open pilon fracture of right tibia 03/11/2019  . Pounding heartbeat 01/21/2019  . Precordial chest pain 03/05/2014    Past Medical History:  Diagnosis Date  . Anxiety   . GERD (gastroesophageal reflux disease)    02/21/2019- not current   . Pulmonary nodule 10/2014   Noted on CT scan -> stable and follow-up 2019.  No further evaluation necessary.    Family History  Problem Relation Age of Onset  . Prostate cancer Father   . Valvular heart disease Father        Had a heart valve replaced (unsure of details)  . Breast cancer Mother   . Parkinson's disease Mother   . Healthy Sister   . Healthy Brother   . Parkinson's disease Maternal Grandmother   . Diabetes Maternal Grandfather     Past Surgical History:  Procedure Laterality Date  . APPLICATION OF WOUND VAC Right 06/10/2019   Procedure: Application Of Wound Vac -Prevena;  Surgeon: Newt Minion, MD;  Location: [place];  Service: Orthopedics;  Laterality: Right;  . CLOSED REDUCTION WRIST FRACTURE Right 03/11/2019   Procedure: CLOSED REDUCTION WRIST;  Surgeon: Shona Needles, MD;  Location: [place];  Service: Orthopedics;  Laterality: Right;  . COLONOSCOPY    . EXTERNAL FIXATION LEG Right 03/11/2019   Procedure: EXTERNAL FIXATION LEG;  Surgeon: Shona Needles, MD;  Location: [place];  Service: Orthopedics;  Laterality: Right;  . EXTERNAL FIXATION REMOVAL Right 05/08/2019   Procedure: REMOVAL EXTERNAL FIXATION ANKLE, PLACEMENT OF CAST;  Surgeon: Shona Needles, MD;  Location: [place];  Service: Orthopedics;  Laterality: Right;  . FOOT SURGERY Bilateral    hammer toe  . HERNIA REPAIR Bilateral 2009   Inguinial  . I&D EXTREMITY Right 03/11/2019   Procedure: IRRIGATION AND DEBRIDEMENT EXTREMITY;  Surgeon: Shona Needles, MD;  Location: [facility];  Service: Orthopedics;  Laterality: Right;  . IR FLUORO GUIDE CV LINE RIGHT  07/09/2019  . OPEN REDUCTION INTERNAL FIXATION (ORIF) TIBIA/FIBULA FRACTURE Right 06/10/2019   Procedure: EXCISION RIGHT TIBIA AND FIBULA, WOUND DEBRIDEMENT, AND FIXATION OF TIBIA AND FIBULA;  Surgeon: Newt Minion, MD;  Location: [facility];   Service: Orthopedics;  Laterality: Right;  . OPEN REDUCTION INTERNAL FIXATION (ORIF) TIBIA/FIBULA FRACTURE Right 06/12/2019   Procedure: DEBRIDEMENT AND REVISION INTERNAL FIXATION INFECTED RIGHT PILON FRACTURE;  Surgeon: Newt Minion, MD;  Location: [facility];  Service: Orthopedics;  Laterality: Right;  . ORIF ANKLE FRACTURE Right 03/11/2019   Procedure: OPEN REDUCTION INTERNAL FIXATION (ORIF) ANKLE FRACTURE;  Surgeon: Shona Needles, MD;  Location: [facility];  Service: Orthopedics;  Laterality: Right;  . ORIF WRIST FRACTURE Right 03/11/2019   Procedure: OPEN REDUCTION INTERNAL FIXATION (ORIF) WRIST FRACTURE;  Surgeon: Shona Needles, MD;  Location: [facility];  Service: Orthopedics;  Laterality: Right;  . TONSILLECTOMY     Social History   Occupational  History  . Occupation: Production designer, theatre/television/filmManager at [employer]    Employer: [employer]  Tobacco Use  . Smoking status: Never Smoker  . Smokeless tobacco: Never Used  Substance and Sexual Activity  . Alcohol use: No  . Drug use: No  . Sexual activity: Not on file

## 2019-07-23 ENCOUNTER — Telehealth: Payer: Self-pay | Admitting: *Deleted

## 2019-07-23 LAB — ACID FAST CULTURE WITH REFLEXED SENSITIVITIES (MYCOBACTERIA): Acid Fast Culture: NEGATIVE

## 2019-07-23 NOTE — Telephone Encounter (Signed)
Relayed verbal order per Dr Baxter Flattery to [facility] [place] to pull PICC on 8/8.  Order repeated and verified. RN left message on patient's phone to let him know, too. Landis Gandy, RN   Please let lynn know to have picc line pulled on sat  ----- Message -----  From: Landis Gandy, RN  Sent: 07/23/2019  4:26 PM EDT  To: Carlyle Basques, MD  Subject: RE: Visit Follow-Up Question             Lab results from 8/3:    ESR: 6  CRP: 3  Vanc Trough: 15.2  BUN/Creatinine: 12/0.97  ----- Message -----  From: Pete Pelt  Sent: 07/23/2019  4:19 PM EDT  To: [facility] Clinical Pool  Subject: RE: Visit Follow-Up Question             I spoke with [facility] today and they said the results were sent yesterday,

## 2019-07-29 ENCOUNTER — Other Ambulatory Visit: Payer: Self-pay

## 2019-07-29 ENCOUNTER — Encounter: Payer: Self-pay | Admitting: Internal Medicine

## 2019-07-29 ENCOUNTER — Ambulatory Visit (INDEPENDENT_AMBULATORY_CARE_PROVIDER_SITE_OTHER): Payer: 59 | Admitting: Internal Medicine

## 2019-07-29 DIAGNOSIS — M86261 Subacute osteomyelitis, right tibia and fibula: Secondary | ICD-10-CM

## 2019-07-29 NOTE — Progress Notes (Signed)
  RFV: follow up for osteomyelitis-evisit  Patient ID: Jonathan Bass, male   DOB: July 22, 1961, 58 y.o.   MRN: [id number]  HPI  Patient is a 58 year old gentleman status post limb salvage intervention for excision of osteomyelitis and the necrotic wound from the open fracture as well as internal fixation of the pilon fracture.  Patient states he still has some swelling he is nonweightbearing with a kneeling scooter wearing his medical compression stocking. Still non weightbearing for 2 addn weeks. Small remaining wound is healing- small subcentimeter that is healing  Outpatient Encounter Medications as of 07/29/2019  Medication Sig  . aspirin EC 81 MG tablet Take 81 mg by mouth daily.  . Cholecalciferol (VITAMIN D3) 125 MCG (5000 UT) TABS Take 1 tablet (5,000 Units total) by mouth daily.  Marland Kitchen gabapentin (NEURONTIN) 300 MG capsule Take 1 capsule (300 mg total) by mouth 3 (three) times daily.  Marland Kitchen HYDROcodone-acetaminophen (NORCO/VICODIN) 5-325 MG tablet Take 1 tablet by mouth every 4 (four) hours as needed.  . mupirocin ointment (BACTROBAN) 2 % Apply 1 application topically daily. Apply to right ankle daily  . propranolol (INDERAL) 10 MG tablet Take 10 mg by mouth 2 (two) times daily as needed (anxiety).   . sertraline (ZOLOFT) 100 MG tablet Take 150 mg by mouth daily.   . vitamin C (VITAMIN C) 500 MG tablet Take 1 tablet (500 mg total) by mouth daily.   No facility-administered encounter medications on file as of 07/29/2019.      Patient Active Problem List   Diagnosis Date Noted  . Pilon fracture of right tibia, sequela 06/10/2019  . Displaced pilon fracture of right tibia, initial encounter for open fracture type IIIA, IIIB, or IIIC   . Subacute osteomyelitis of right tibia ([place])   . Surgery, elective   . Anxiety   . Open pilon fracture, right, type III, initial encounter 03/11/2019  . Closed fracture of right distal radius 03/11/2019  . Type III open pilon fracture of right tibia  03/11/2019  . Pounding heartbeat 01/21/2019  . Precordial chest pain 03/05/2014     Health Maintenance Due  Topic Date Due  . Hepatitis C Screening  10/28/1961  . COLONOSCOPY  06/10/2009  . INFLUENZA VACCINE  07/18/2019     Review of Systems 12 point ros is negative Physical Exam   Did not examine since e visit CBC Lab Results  Component Value Date   WBC 6.4 05/08/2019   RBC 4.61 05/08/2019   HGB 14.7 06/10/2019   HCT 41.9 05/08/2019   PLT 238 05/08/2019   MCV 90.9 05/08/2019   MCH 29.7 05/08/2019   MCHC 32.7 05/08/2019   RDW 13.2 05/08/2019   LYMPHSABS 1.8 03/11/2019   MONOABS 0.5 03/11/2019   EOSABS 0.1 03/11/2019    BMET Lab Results  Component Value Date   NA 138 06/14/2019   K 3.6 06/14/2019   CL 108 06/14/2019   CO2 21 (L) 06/14/2019   GLUCOSE 140 (H) 06/14/2019   BUN 13 06/14/2019   CREATININE 1.04 06/14/2019   CALCIUM 8.9 06/14/2019   GFRNONAA >60 06/14/2019   GFRAA >60 06/14/2019    Assessment and Plan - has completed abtx.i have reviewed his labs and his inflammatory markers are normalized. - monitor off of abtx - continue with local wound care  Spent 15 min with patient over the phone discussing management of osteomyelitis of foot

## 2019-08-11 ENCOUNTER — Ambulatory Visit (INDEPENDENT_AMBULATORY_CARE_PROVIDER_SITE_OTHER): Payer: 59 | Admitting: Orthopedic Surgery

## 2019-08-11 ENCOUNTER — Encounter: Payer: Self-pay | Admitting: Orthopedic Surgery

## 2019-08-11 ENCOUNTER — Ambulatory Visit: Payer: Self-pay

## 2019-08-11 DIAGNOSIS — S82871C Displaced pilon fracture of right tibia, initial encounter for open fracture type IIIA, IIIB, or IIIC: Secondary | ICD-10-CM

## 2019-08-11 NOTE — Progress Notes (Signed)
Office Visit Note   Patient: Jonathan Bass           Date of Birth: October 09, 1961           MRN: [id number] Visit Date: 08/11/2019              Requested by: Juluis RainierBarnes, Elizabeth, MD [address] [place],  [place] [address] PCP: Juluis RainierBarnes, Elizabeth, MD  Chief Complaint  Patient presents with  . Right Ankle - Follow-up      HPI: Patient is a 58 year old gentleman status post revision fusion for open grade 3 pilon fracture right ankle.  Patient has been nonweightbearing for 2 months.  He states he is doing well he states that the small area of the open wound has completely scabbed over he denies any cellulitis drainage under for pain.  Assessment & Plan: Visit Diagnoses:  1. Open pilon fracture, right, type III, initial encounter     Plan: Patient will begin increasing his weightbearing as tolerated with the fracture boot recommended using a cane in the opposite hand to evenly distribute the weight.  At follow-up with will repeat three-view radiographs of the right ankle.  Follow-Up Instructions: Return in about 4 weeks (around 09/08/2019).   Ortho Exam  Patient is alert, oriented, no adenopathy, well-dressed, normal affect, normal respiratory effort. Examination the skin is intact well-healed no signs of infection his foot is plantigrade ankles at 90 degrees.  Imaging: No results found. No images are attached to the encounter.  Labs: Lab Results  Component Value Date   REPTSTATUS 06/17/2019 FINAL 06/12/2019   GRAMSTAIN  06/12/2019    RARE WBC PRESENT, PREDOMINANTLY PMN NO ORGANISMS SEEN    CULT  06/12/2019    RARE PSEUDOMONAS PUTIDA NO ANAEROBES ISOLATED CRITICAL RESULT CALLED TO, READ BACK BY AND VERIFIED WITH: RN Gaynelle CageSABRINA S  [id number] AT 1315 BY CM Performed at [facility], [address] [place], [place] [address]    LABORGA PSEUDOMONAS PUTIDA 06/12/2019     Lab Results  Component Value Date   ALBUMIN 3.3 (L) 06/14/2019   ALBUMIN 4.0 03/11/2019   ALBUMIN 4.0 01/19/2012    No results found for: MG No results found for: VD25OH  No results found for: PREALBUMIN CBC EXTENDED Latest Ref Rng & Units 06/10/2019 05/08/2019 03/12/2019  WBC 4.0 - 10.5 K/uL - 6.4 14.0(H)  RBC 4.22 - 5.81 MIL/uL - 4.61 4.17(L)  HGB 13.0 - 17.0 g/dL 13.014.7 86.513.7 12.4(L)  HCT 39.0 - 52.0 % - 41.9 38.1(L)  PLT 150 - 400 K/uL - 238 206  NEUTROABS 1.7 - 7.7 K/uL - - -  LYMPHSABS 0.7 - 4.0 K/uL - - -     There is no height or weight on file to calculate BMI.  Orders:  Orders Placed This Encounter  Procedures  . XR Ankle Complete Right   No orders of the defined types were placed in this encounter.    Procedures: No procedures performed  Clinical Data: No additional findings.  ROS:  All other systems negative, except as noted in the HPI. Review of Systems  Objective: Vital Signs: There were no vitals taken for this visit.  Specialty Comments:  No specialty comments available.  PMFS History: Patient Active Problem List   Diagnosis Date Noted  . Pilon fracture of right tibia, sequela 06/10/2019  . Displaced pilon fracture of right tibia, initial encounter for open fracture type IIIA, IIIB, or IIIC   . Subacute osteomyelitis of right tibia ([facility])   .  Surgery, elective   . Anxiety   . Open pilon fracture, right, type III, initial encounter 03/11/2019  . Closed fracture of right distal radius 03/11/2019  . Type III open pilon fracture of right tibia 03/11/2019  . Pounding heartbeat 01/21/2019  . Precordial chest pain 03/05/2014   Past Medical History:  Diagnosis Date  . Anxiety   . GERD (gastroesophageal reflux disease)    02/21/2019- not current   . Pulmonary nodule 10/2014   Noted on CT scan -> stable and follow-up 2019.  No further evaluation necessary.    Family History  Problem Relation Age of Onset  . Prostate cancer Father   . Valvular heart disease Father        Had a heart valve replaced (unsure of details)  . Breast cancer  Mother   . Parkinson's disease Mother   . Healthy Sister   . Healthy Brother   . Parkinson's disease Maternal Grandmother   . Diabetes Maternal Grandfather     Past Surgical History:  Procedure Laterality Date  . APPLICATION OF WOUND VAC Right 06/10/2019   Procedure: Application Of Wound Vac -Prevena;  Surgeon: Newt Minion, MD;  Location: [facility];  Service: Orthopedics;  Laterality: Right;  . CLOSED REDUCTION WRIST FRACTURE Right 03/11/2019   Procedure: CLOSED REDUCTION WRIST;  Surgeon: Shona Needles, MD;  Location: [facility];  Service: Orthopedics;  Laterality: Right;  . COLONOSCOPY    . EXTERNAL FIXATION LEG Right 03/11/2019   Procedure: EXTERNAL FIXATION LEG;  Surgeon: Shona Needles, MD;  Location: [facility];  Service: Orthopedics;  Laterality: Right;  . EXTERNAL FIXATION REMOVAL Right 05/08/2019   Procedure: REMOVAL EXTERNAL FIXATION ANKLE, PLACEMENT OF CAST;  Surgeon: Shona Needles, MD;  Location: [facility];  Service: Orthopedics;  Laterality: Right;  . FOOT SURGERY Bilateral    hammer toe  . HERNIA REPAIR Bilateral 2009   Inguinial  . I&D EXTREMITY Right 03/11/2019   Procedure: IRRIGATION AND DEBRIDEMENT EXTREMITY;  Surgeon: Shona Needles, MD;  Location: [facility];  Service: Orthopedics;  Laterality: Right;  . IR FLUORO GUIDE CV LINE RIGHT  07/09/2019  . OPEN REDUCTION INTERNAL FIXATION (ORIF) TIBIA/FIBULA FRACTURE Right 06/10/2019   Procedure: EXCISION RIGHT TIBIA AND FIBULA, WOUND DEBRIDEMENT, AND FIXATION OF TIBIA AND FIBULA;  Surgeon: Newt Minion, MD;  Location: [facility];  Service: Orthopedics;  Laterality: Right;  . OPEN REDUCTION INTERNAL FIXATION (ORIF) TIBIA/FIBULA FRACTURE Right 06/12/2019   Procedure: DEBRIDEMENT AND REVISION INTERNAL FIXATION INFECTED RIGHT PILON FRACTURE;  Surgeon: Newt Minion, MD;  Location: [facility];  Service: Orthopedics;  Laterality: Right;  . ORIF ANKLE FRACTURE Right 03/11/2019   Procedure: OPEN REDUCTION INTERNAL FIXATION (ORIF) ANKLE FRACTURE;  Surgeon:  Shona Needles, MD;  Location: [facility];  Service: Orthopedics;  Laterality: Right;  . ORIF WRIST FRACTURE Right 03/11/2019   Procedure: OPEN REDUCTION INTERNAL FIXATION (ORIF) WRIST FRACTURE;  Surgeon: Shona Needles, MD;  Location: [facility];  Service: Orthopedics;  Laterality: Right;  . TONSILLECTOMY     Social History   Occupational History  . Occupation: Freight forwarder at [employer]: [place]  Tobacco Use  . Smoking status: Never Smoker  . Smokeless tobacco: Never Used  Substance and Sexual Activity  . Alcohol use: No  . Drug use: No  . Sexual activity: Not on file

## 2019-08-17 ENCOUNTER — Encounter: Payer: Self-pay | Admitting: Orthopedic Surgery

## 2019-08-17 ENCOUNTER — Other Ambulatory Visit: Payer: Self-pay

## 2019-08-17 DIAGNOSIS — S82871C Displaced pilon fracture of right tibia, initial encounter for open fracture type IIIA, IIIB, or IIIC: Secondary | ICD-10-CM

## 2019-08-19 ENCOUNTER — Telehealth: Payer: Self-pay

## 2019-08-19 NOTE — Telephone Encounter (Signed)
Called patient, left voicemail to call the office for lab appointment. Received orders Dr. Baxter Flattery. Patient needs sed rate, and crp next week.  Will attempt to call patient again.  Eugenia Mcalpine

## 2019-08-21 ENCOUNTER — Encounter: Payer: Self-pay | Admitting: Physical Therapy

## 2019-08-21 ENCOUNTER — Other Ambulatory Visit: Payer: Self-pay

## 2019-08-21 ENCOUNTER — Ambulatory Visit: Payer: 59 | Attending: Orthopedic Surgery | Admitting: Physical Therapy

## 2019-08-21 ENCOUNTER — Other Ambulatory Visit: Payer: Self-pay | Admitting: *Deleted

## 2019-08-21 DIAGNOSIS — R600 Localized edema: Secondary | ICD-10-CM | POA: Diagnosis present

## 2019-08-21 DIAGNOSIS — M86261 Subacute osteomyelitis, right tibia and fibula: Secondary | ICD-10-CM

## 2019-08-21 DIAGNOSIS — R262 Difficulty in walking, not elsewhere classified: Secondary | ICD-10-CM | POA: Insufficient documentation

## 2019-08-21 DIAGNOSIS — M25671 Stiffness of right ankle, not elsewhere classified: Secondary | ICD-10-CM | POA: Diagnosis present

## 2019-08-21 NOTE — Therapy (Signed)
[facility] [address] [place], [place], [address] Phone: [phone]   Fax:  [phone]  Physical Therapy Evaluation  Patient Details  Name: Jonathan AstStanley J Kondo MRN: [id number] Date of Birth: Jan 31, 1961 Referring Provider (PT): Nadara Mustarduda, Marcus V, MD   Encounter Date: 08/21/2019  PT End of Session - 08/21/19 1213    Visit Number  1    Number of Visits  16    Date for PT Re-Evaluation  10/30/19    Authorization Type  [employer]    PT Start Time  1145    PT Stop Time  1232    PT Time Calculation (min)  47 min    Activity Tolerance  Patient tolerated treatment well    Behavior During Therapy  [facility] for tasks assessed/performed       Past Medical History:  Diagnosis Date  . Anxiety   . GERD (gastroesophageal reflux disease)    02/21/2019- not current   . Pulmonary nodule 10/2014   Noted on CT scan -> stable and follow-up 2019.  No further evaluation necessary.    Past Surgical History:  Procedure Laterality Date  . APPLICATION OF WOUND VAC Right 06/10/2019   Procedure: Application Of Wound Vac -Prevena;  Surgeon: Nadara Mustarduda, Marcus V, MD;  [facility];  Service: Orthopedics;  Laterality: Right;  . CLOSED REDUCTION WRIST FRACTURE Right 03/11/2019   Procedure: CLOSED REDUCTION WRIST;  Surgeon: Roby LoftsHaddix, Kevin P, MD;  [facility];  Service: Orthopedics;  Laterality: Right;  . COLONOSCOPY    . EXTERNAL FIXATION LEG Right 03/11/2019   Procedure: EXTERNAL FIXATION LEG;  Surgeon: Roby LoftsHaddix, Kevin P, MD;  [facility];  Service: Orthopedics;  Laterality: Right;  . EXTERNAL FIXATION REMOVAL Right 05/08/2019   Procedure: REMOVAL EXTERNAL FIXATION ANKLE, PLACEMENT OF CAST;  Surgeon: Roby LoftsHaddix, Kevin P, MD;  [facility];  Service: Orthopedics;  Laterality: Right;  . FOOT SURGERY Bilateral    hammer toe  . HERNIA REPAIR Bilateral 2009   Inguinial  . I&D EXTREMITY Right 03/11/2019   Procedure: IRRIGATION AND DEBRIDEMENT EXTREMITY;  Surgeon: Roby LoftsHaddix,  Kevin P, MD;  [facility];  Service: Orthopedics;  Laterality: Right;  . IR FLUORO GUIDE CV LINE RIGHT  07/09/2019  . OPEN REDUCTION INTERNAL FIXATION (ORIF) TIBIA/FIBULA FRACTURE Right 06/10/2019   Procedure: EXCISION RIGHT TIBIA AND FIBULA, WOUND DEBRIDEMENT, AND FIXATION OF TIBIA AND FIBULA;  Surgeon: Nadara Mustarduda, Marcus V, MD;  [facility];  Service: Orthopedics;  Laterality: Right;  . OPEN REDUCTION INTERNAL FIXATION (ORIF) TIBIA/FIBULA FRACTURE Right 06/12/2019   Procedure: DEBRIDEMENT AND REVISION INTERNAL FIXATION INFECTED RIGHT PILON FRACTURE;  Surgeon: Nadara Mustarduda, Marcus V, MD;  [facility];  Service: Orthopedics;  Laterality: Right;  . ORIF ANKLE FRACTURE Right 03/11/2019   Procedure: OPEN REDUCTION INTERNAL FIXATION (ORIF) ANKLE FRACTURE;  Surgeon: Roby LoftsHaddix, Kevin P, MD;  [facility];  Service: Orthopedics;  Laterality: Right;  . ORIF WRIST FRACTURE Right 03/11/2019   Procedure: OPEN REDUCTION INTERNAL FIXATION (ORIF) WRIST FRACTURE;  Surgeon: Roby LoftsHaddix, Kevin P, MD;  [facility];  Service: Orthopedics;  Laterality: Right;  . TONSILLECTOMY      There were no vitals filed for this visit.   Subjective Assessment - 08/21/19 1150    Subjective  Dr Lajoyce Cornersuda suspects he took about a cm off from leg length but not officially measured. Have been WB in boot walking with crutches since last Tuesday.    Currently in Pain?  No/denies  [facility] PT Assessment - 08/21/19 0001      Assessment   Medical Diagnosis  s/p revision fusion open grade 3 pilon fracture right ankle.    Referring Provider (PT)  Nadara Mustard, MD    Onset Date/Surgical Date  06/12/19   last surgery   Next MD Visit  9/22    Prior Therapy  no      Precautions   Precautions  None      Restrictions   Other Position/Activity Restrictions  WBAT      Balance Screen   Has the patient fallen in the past 6 months  Yes    How many times?  1    Has the patient had a decrease in activity level because of a fear of  falling?   Yes    Is the patient reluctant to leave their home because of a fear of falling?   No      Home Public house manager residence    Living Arrangements  Spouse/significant other      Prior Function   Level of Independence  Independent    Vocation  Retired    Librarian, academic work, starting full time with [employer]      Cognition   Overall Cognitive Status  Within Functional Limits for tasks assessed      Observation/Other Assessments-Edema    Edema  --   significant edema in Rt foot/ankle vs left     Sensation   Additional Comments  WFL      ROM / Strength   AROM / PROM / Strength  AROM      AROM   AROM Assessment Site  Ankle    Right/Left Ankle  Right    Right Ankle Dorsiflexion  0    Right Ankle Plantar Flexion  10      Palpation   Palpation comment  limited scar mobility, limited joint mobility & flexibility grossly      Ambulation/Gait   Gait Comments  bilateral axillary crutches with CAM boot                Objective measurements completed on examination: See above findings.      [facility] Adult PT Treatment/Exercise - 08/21/19 0001      Exercises   Exercises  Ankle;Other Exercises    Other Exercises   discussed gross hip and knee strengthening exercises      Modalities   Modalities  Vasopneumatic      Vasopneumatic   Number Minutes Vasopneumatic   15 minutes    Vasopnuematic Location   Ankle   right   Vasopneumatic Pressure  Low    Vasopneumatic Temperature   32      Ankle Exercises: Supine   Other Supine Ankle Exercises  toe curls    Other Supine Ankle Exercises  ankle circles             PT Education - 08/21/19 1345    Education Details  anatomy of condition, POC, HEP, exercise form/rationale    Person(s) Educated  Patient    Methods  Explanation    Comprehension  Verbalized understanding;Need further instruction       PT Short Term Goals - 08/21/19 1358      PT  SHORT TERM GOAL #1   Title  Pt will demo heel/toe gait pattern in tennis shoes and LRAD for short distances    Baseline  cam boot and bil axillary  crutches at eval    Time  4    Period  Weeks    Status  New    Target Date  09/18/19      PT SHORT TERM GOAL #2   Title  pt will tolerate at least 50% weight bearing through RLE (measured on scale) with good control of proximal posture    Baseline  has not tried at eval    Time  4    Period  Weeks    Status  New    Target Date  09/18/19      PT SHORT TERM GOAL #3   Title  pt will demo controlled AROM all 4 directions in ankle in [place]    Baseline  limited in DF/PF and no activation noted into inversion/eversion    Time  4    Period  Weeks    Status  New    Target Date  09/18/19        PT Long Term Goals - 08/21/19 1402      PT LONG TERM GOAL #1   Title  to be set at short term mark depending on progress and healing.             Plan - 08/21/19 1349    Clinical Impression Statement  Pt presents to PT s/p revision for Rt pilon fracture. Has an extensive history of surgical approaches for this ankle due to infection and delayed healing. Reports surgeon telling him he had to cut approximately 1 cm off of length of tib/fib so will have a LLD. Has been in a CAM boot for just over a week and is WBAT. Reports he feels nervous about placing weight through the foot- does not trust it. We had an extended discussion on care and PT moving forward as well as strengthening options for hip/knee. He was very active prior to injury and rides his bike daily which I advised him to wear both tennis shoes while on bike rather than the boot. Pt has limited mobility in ankle/foot as expected and was encouraged to elevate and ice at least 3 times daily. Pt will benefit from skilled PT in order to address deficits and meet long term goals.    Personal Factors and Comorbidities  Comorbidity 1    Comorbidities  infection causing delayed healing     Examination-Activity Limitations  Bathing;Locomotion Level;Transfers;Sit;Sleep;Squat;Dressing;Stairs;Stand;Lift;Hygiene/Grooming    Examination-Participation Restrictions  Meal Prep;Cleaning;Community Activity;Driving   work activities   Stability/Clinical Decision Making  Unstable/Unpredictable    Clinical Decision Making  Moderate    Rehab Potential  Good    PT Frequency  --   1/week 4 weeks, 2/week following   PT Duration  12 weeks    PT Treatment/Interventions  ADLs/Self Care Home Management;Cryotherapy;Electrical Stimulation;Gait training;Functional mobility Network engineertraining;Stair training;Therapeutic activities;Therapeutic exercise;Balance training;Patient/family education;Neuromuscular re-education;Manual techniques;Passive range of motion;Taping;Vasopneumatic Device    PT Next Visit Plan  scar mobility, gross ankle/foot stretching and mobilization    PT Home Exercise Plan  ice/elevate 3/day, bike, hip strengthening    Consulted and Agree with Plan of Care  Patient       Patient will benefit from skilled therapeutic intervention in order to improve the following deficits and impairments:  Abnormal gait, Decreased range of motion, Difficulty walking, Increased muscle spasms, Decreased activity tolerance, Pain, Improper body mechanics, Impaired flexibility, Hypomobility, Decreased scar mobility, Decreased balance, Decreased mobility, Decreased strength, Increased edema, Postural dysfunction  Visit Diagnosis: Stiffness of right ankle, not elsewhere classified - Plan: PT  plan of care cert/re-cert  Localized edema - Plan: PT plan of care cert/re-cert  Difficulty in walking, not elsewhere classified - Plan: PT plan of care cert/re-cert     Problem List Patient Active Problem List   Diagnosis Date Noted  . Pilon fracture of right tibia, sequela 06/10/2019  . Displaced pilon fracture of right tibia, initial encounter for open fracture type IIIA, IIIB, or IIIC   . Subacute osteomyelitis of  right tibia ([place])   . Surgery, elective   . Anxiety   . Open pilon fracture, right, type III, initial encounter 03/11/2019  . Closed fracture of right distal radius 03/11/2019  . Type III open pilon fracture of right tibia 03/11/2019  . Pounding heartbeat 01/21/2019  . Precordial chest pain 03/05/2014    Duel Conrad C. Harveer Sadler PT, DPT 08/21/19 2:05 PM   [facility] [address] [place], [place], [address] Phone: [phone]   Fax:  [phone]  Name: JERMAYNE SWEENEY MRN: [id number] Date of Birth: Jan 20, 1961

## 2019-08-25 ENCOUNTER — Other Ambulatory Visit: Payer: 59

## 2019-08-25 ENCOUNTER — Other Ambulatory Visit: Payer: Self-pay

## 2019-08-25 DIAGNOSIS — M86261 Subacute osteomyelitis, right tibia and fibula: Secondary | ICD-10-CM

## 2019-08-26 ENCOUNTER — Telehealth: Payer: Self-pay

## 2019-08-26 LAB — C-REACTIVE PROTEIN: CRP: 7.5 mg/L (ref <8.0)

## 2019-08-26 LAB — SEDIMENTATION RATE: Sed Rate: 6 mm/h (ref 0–20)

## 2019-08-26 NOTE — Telephone Encounter (Signed)
Called patient concerning message in MyChart, no answer and mailbox is full. Scheduled patient with Dr. Ellyn Hack on 08/31/19 @ 1020. Left the patient a message in [place].

## 2019-08-28 ENCOUNTER — Encounter

## 2019-08-31 ENCOUNTER — Ambulatory Visit: Payer: 59 | Admitting: Cardiology

## 2019-08-31 ENCOUNTER — Encounter: Payer: Self-pay | Admitting: Cardiology

## 2019-08-31 ENCOUNTER — Other Ambulatory Visit: Payer: Self-pay

## 2019-08-31 VITALS — BP 150/83 | HR 68 | Temp 97.2°F | Ht 71.0 in | Wt 203.2 lb

## 2019-08-31 DIAGNOSIS — R002 Palpitations: Secondary | ICD-10-CM | POA: Diagnosis not present

## 2019-08-31 DIAGNOSIS — R072 Precordial pain: Secondary | ICD-10-CM | POA: Diagnosis not present

## 2019-08-31 NOTE — Progress Notes (Signed)
PCP: Juluis RainierBarnes, Elizabeth, MD [facility] [address] [place], [place], [address]  Clinic Note: Chief Complaint  Patient presents with  . Follow-up    Study results  . Chest Pain    No further symptoms  . Palpitations    Stable   HPI:  Jonathan Bass is a 58 y.o. male with PMH of Varicose Veins who is being seen today for follow-up evaluation of Chest Pain.  He was initially seen at the request of Juluis RainierBarnes, Elizabeth, MD.  Jonathan Bass was seen in early February 2020 in response to hospitalization back in November 2019 while [place] where he was evaluated for chest pain and then a ER visit in January also with chest pain.   Indicated that his symptoms of chest discomfort were not exertional, and when he exercised on his bicycle, he denies any symptoms.  He also noted some in the left side of his arm.  -- Monitor, Echo & Coronary Ca2+ score  Hospitalizations:   March 25 -> suffered a fall, fractured right ankle.  Underwent ORIF and debridement of ankle.  --> This was complicated by wound infection, and care was transferred to Dr. 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 -->  Had PICC line placed for IV antibiotics.  Studies Personally Reviewed - (if available, images/films reviewed: From Epic Chart or Care Everywhere)  14-day monitor: Predominantly NSR with average rate 60 bpm.  One short 4 beat run of NSVT.  Minimal PACs and PVCs noted.  No sustained arrhythmia.  Echo February 2018 2020: EF 55 to 60%.  Normal wall motion.  Indeterminate likely grade 1 diastolic function.  Mild degenerative mitral valve disease with thickening but no prolapse.  Cor Ca Score 01/2019: Score = 0  Interval History: Jonathan Bass presents today overall doing fine from a cardiac standpoint.  He says he off-and-on still has palpitations but nothing lasting more than a few seconds.  A little more noticeable when he was dealing with  his injury and surgeries.  But now that that is calm down is been doing better.  He also notes that his blood pressures been going up a little bit of late.  Initial blood pressure today was 150/83 but on my recheck was 130/80.  He is not having any cardiac symptoms now.  No further chest pain or pressure with rest or exertion.  He really attributed a lot of this discomfort he was having before to different foods.  Cardiovascular Review of symptoms: no chest pain or dyspnea on exertion positive for - irregular heartbeat and palpitations negative for - orthopnea, paroxysmal nocturnal dyspnea, rapid heart rate, shortness of breath or Syncope/near syncope or TIA/amaurosis fugax, claudication   ROS: A comprehensive was performed. Review of Systems  Constitutional: Negative for malaise/fatigue and weight loss.  HENT: Negative for nosebleeds.   Cardiovascular: Chest pain: Per HPI.  Gastrointestinal: Positive for abdominal pain (As is bloating distention feeling associated with these episodes.) and heartburn. Negative for blood in stool.  Musculoskeletal: Positive for joint pain (Still having intermittent discomfort in the right ankle.  ).  Psychiatric/Behavioral: The patient is nervous/anxious.    I have reviewed and (if needed) personally updated the patient's problem list, medications, allergies, past medical and surgical history, social and family history.   Past Medical History:  Diagnosis Date  . Anxiety   . GERD (gastroesophageal reflux disease)    02/21/2019- not current   . Pulmonary nodule  10/2014   Noted on CT scan -> stable and follow-up 2019.  No further evaluation necessary.    Past Surgical History:  Procedure Laterality Date  . APPLICATION OF WOUND VAC Right 06/10/2019   Procedure: Application Of Wound Vac -Prevena;  Surgeon: Newt Minion, MD;  Location: [place];  Service: Orthopedics;  Laterality: Right;  . CARDIAC EVENT MONITOR  01/2019   redominantly NSR with average rate 60  bpm.  One short 4 beat run of NSVT.  Minimal PACs and PVCs noted.  No sustained arrhythmia.  Marland Kitchen CLOSED REDUCTION WRIST FRACTURE Right 03/11/2019   Procedure: CLOSED REDUCTION WRIST;  Surgeon: Shona Needles, MD;  Location: [place];  Service: Orthopedics;  Laterality: Right;  . COLONOSCOPY    . EXTERNAL FIXATION LEG Right 03/11/2019   Procedure: EXTERNAL FIXATION LEG;  Surgeon: Shona Needles, MD;  Location: [place];  Service: Orthopedics;  Laterality: Right;  . EXTERNAL FIXATION REMOVAL Right 05/08/2019   Procedure: REMOVAL EXTERNAL FIXATION ANKLE, PLACEMENT OF CAST;  Surgeon: Shona Needles, MD;  Location: [place];  Service: Orthopedics;  Laterality: Right;  . FOOT SURGERY Bilateral    hammer toe  . HERNIA REPAIR Bilateral 2009   Inguinial  . I&D EXTREMITY Right 03/11/2019   Procedure: IRRIGATION AND DEBRIDEMENT EXTREMITY;  Surgeon: Shona Needles, MD;  Location: [place];  Service: Orthopedics;  Laterality: Right;  . IR FLUORO GUIDE CV LINE RIGHT  07/09/2019  . OPEN REDUCTION INTERNAL FIXATION (ORIF) TIBIA/FIBULA FRACTURE Right 06/10/2019   Procedure: EXCISION RIGHT TIBIA AND FIBULA, WOUND DEBRIDEMENT, AND FIXATION OF TIBIA AND FIBULA;  Surgeon: Newt Minion, MD;  Location: [place];  Service: Orthopedics;  Laterality: Right;  . OPEN REDUCTION INTERNAL FIXATION (ORIF) TIBIA/FIBULA FRACTURE Right 06/12/2019   Procedure: DEBRIDEMENT AND REVISION INTERNAL FIXATION INFECTED RIGHT PILON FRACTURE;  Surgeon: Newt Minion, MD;  Location: [place];  Service: Orthopedics;  Laterality: Right;  . ORIF ANKLE FRACTURE Right 03/11/2019   Procedure: OPEN REDUCTION INTERNAL FIXATION (ORIF) ANKLE FRACTURE;  Surgeon: Shona Needles, MD;  Location: [place];  Service: Orthopedics;  Laterality: Right;  . ORIF WRIST FRACTURE Right 03/11/2019   Procedure: OPEN REDUCTION INTERNAL FIXATION (ORIF) WRIST FRACTURE;  Surgeon: Shona Needles, MD;  Location: [place];  Service: Orthopedics;  Laterality: Right;  . TONSILLECTOMY    .  TRANSTHORACIC ECHOCARDIOGRAM  01/2019   EF 55 to 60%.  Normal wall motion.  Indeterminate likely grade 1 diastolic function.  Mild degenerative mitral valve disease with thickening but no prolapse.    Current Meds  Medication Sig  . acetaminophen (TYLENOL) 325 MG tablet Take 650 mg by mouth every 6 (six) hours as needed.  Marland Kitchen aspirin EC 81 MG tablet Take 81 mg by mouth daily.  . Cholecalciferol (VITAMIN D3) 125 MCG (5000 UT) TABS Take 1 tablet (5,000 Units total) by mouth daily.  . propranolol (INDERAL) 10 MG tablet Take 10 mg by mouth 2 (two) times daily as needed (anxiety).   . sertraline (ZOLOFT) 100 MG tablet Take 150 mg by mouth daily.   . vitamin C (VITAMIN C) 500 MG tablet Take 1 tablet (500 mg total) by mouth daily.    No Known Allergies  Social History   Tobacco Use  . Smoking status: Never Smoker  . Smokeless tobacco: Never Used  Substance Use Topics  . Alcohol use: No  . Drug use: No   Social History   Social History Narrative   He has been  married for 37 years.  He lives with his wife.  They have 3 children and 3 grandchildren.      He says he exercises just about every day.  He does alternating upper and lower body weights as well as about 30 to 40 minutes of cardio exercise either doing stationary bicycle, elliptical or walking on treadmill.      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  He thinks this may be where he bothered his right arm.    family history includes Breast cancer in his mother; Diabetes in his maternal grandfather; Healthy in his brother and sister; Parkinson's disease in his maternal grandmother and mother; Prostate cancer in his father; Valvular heart disease in his father.  Wt Readings from Last 3 Encounters:  08/31/19 203 lb 3.2 oz (92.2 kg)  07/21/19 195 lb (88.5 kg)  07/13/19 195 lb (88.5 kg)    PHYSICAL  EXAM BP (!) 150/83   Pulse 68   Temp (!) 97.2 F (36.2 C)   Ht 5\' 11"  (1.803 m)   Wt 203 lb 3.2 oz (92.2 kg)   SpO2 97%   BMI 28.34 kg/m  Physical Exam  Constitutional: He is oriented to person, place, and time. He appears well-developed and well-nourished. No distress.  Healthy-appearing.  Well-groomed  HENT:  Head: Normocephalic and atraumatic.  Mouth/Throat: Oropharynx is clear and moist.  Neck: Normal range of motion. Neck supple. No hepatojugular reflux present. Carotid bruit is not present.  Cardiovascular: Normal rate, regular rhythm, normal heart sounds, intact distal pulses and normal pulses.  No extrasystoles are present. PMI is not displaced. Exam reveals no gallop and no friction rub.  No murmur heard. Pulmonary/Chest: Effort normal and breath sounds normal. No respiratory distress. He has no wheezes. He has no rales. He exhibits no tenderness.  Musculoskeletal: Normal range of motion.        General: No edema.  Neurological: He is alert and oriented to person, place, and time.  Skin: He is not diaphoretic.  Psychiatric: He has a normal mood and affect. His behavior is normal. Judgment and thought content normal.  Vitals reviewed.    Adult ECG Report  Rate: 88;  Rhythm: normal sinus rhythm and normal axis, intervals & durations;   Narrative Interpretation: Normal, stable EKG   Other studies Reviewed: Additional studies/ records that were reviewed today include:   June 2020: TC 187, TG 261, HDL 37, LDL 98. Cr 1.18, K+ 3.9, TSH 3.34   ASSESSMENT / PLAN: Problem List Items Addressed This Visit    Pounding heartbeat - Primary    Probably heartbeat/palpitations symptoms seem to be intermittent but not overly worrisome to him.  Event monitor was relatively benign indicating short of salvos for PVCs.  He is currently using Inderal for performance anxiety, so clearly has been able to tolerate beta-blocker. My recommendation would be that if his blood pressure continues  to be elevated, the first line of blood pressure medication would be a long-acting beta-blocker such as nadolol or Toprol.      Relevant Orders   EKG 12-Lead (Completed)   Precordial chest pain    He had atypical chest pain symptoms probably nonanginal in nature.  Coronary calcium score is 0 indicating low likelihood of having significant coronary artery disease.  At this point nothing further evaluation is required.  Continue to monitor.      Relevant Orders   EKG 12-Lead (Completed)      I spent a total of 40 minutes with the patient and chart review. >  50% of the time was spent in direct patient consultation.   Current medicines are reviewed at length with the patient today.  (+/- concerns) n/a The following changes have been made:  n/a  Patient Instructions  Medication Instructions:  NO CHANGES    Lab work: NOT NEEDED If you have labs (blood work) drawn today and your tests are completely normal, you will receive your results only by: Marland Kitchen. MyChart Message (if you have MyChart) OR . A paper copy in the mail If you have any lab test that is abnormal or we need to change your treatment, we will call you to review the results.  Testing/Procedures: NOT NEEDED  Follow-Up: At [facility], you and your health needs are our priority.  As part of our continuing mission to provide you with exceptional heart care, we have created designated Provider Care Teams.  These Care Teams include your primary Cardiologist (physician) and Advanced Practice Providers (APPs -  Physician Assistants and Nurse Practitioners) who all work together to provide you with the care you need, when you need it. You will need a follow up appointment ON AN AS NEEDED BASIS.      Any Other Special Instructions Will Be Listed Below (If Applicable).    Bryan Lemmaavid Stacye Noori, M.D., M.S. Interventional Cardiologist   Pager # [phone] Phone # [phone] [address] [place], [place] [address]    Thank you for choosing [facility]!!

## 2019-08-31 NOTE — Patient Instructions (Addendum)
Medication Instructions:  NO CHANGES    Lab work: NOT NEEDED If you have labs (blood work) drawn today and your tests are completely normal, you will receive your results only by: Marland Kitchen MyChart Message (if you have MyChart) OR . A paper copy in the mail If you have any lab test that is abnormal or we need to change your treatment, we will call you to review the results.  Testing/Procedures: NOT NEEDED  Follow-Up: At [facility], you and your health needs are our priority.  As part of our continuing mission to provide you with exceptional heart care, we have created designated Provider Care Teams.  These Care Teams include your primary Cardiologist (physician) and Advanced Practice Providers (APPs -  Physician Assistants and Nurse Practitioners) who all work together to provide you with the care you need, when you need it. You will need a follow up appointment ON AN AS NEEDED BASIS.      Any Other Special Instructions Will Be Listed Below (If Applicable).

## 2019-09-01 ENCOUNTER — Ambulatory Visit: Payer: 59 | Admitting: Physical Therapy

## 2019-09-02 ENCOUNTER — Encounter: Payer: Self-pay | Admitting: Cardiology

## 2019-09-02 NOTE — Assessment & Plan Note (Signed)
Probably heartbeat/palpitations symptoms seem to be intermittent but not overly worrisome to him.  Event monitor was relatively benign indicating short of salvos for PVCs.  He is currently using Inderal for performance anxiety, so clearly has been able to tolerate beta-blocker. My recommendation would be that if his blood pressure continues to be elevated, the first line of blood pressure medication would be a long-acting beta-blocker such as nadolol or Toprol.

## 2019-09-02 NOTE — Assessment & Plan Note (Signed)
He had atypical chest pain symptoms probably nonanginal in nature.  Coronary calcium score is 0 indicating low likelihood of having significant coronary artery disease.  At this point nothing further evaluation is required.  Continue to monitor.

## 2019-09-08 ENCOUNTER — Ambulatory Visit (INDEPENDENT_AMBULATORY_CARE_PROVIDER_SITE_OTHER): Payer: 59 | Admitting: Orthopedic Surgery

## 2019-09-08 ENCOUNTER — Other Ambulatory Visit: Payer: Self-pay

## 2019-09-08 ENCOUNTER — Encounter: Payer: Self-pay | Admitting: Orthopedic Surgery

## 2019-09-08 ENCOUNTER — Ambulatory Visit: Payer: 59 | Admitting: Physical Therapy

## 2019-09-08 ENCOUNTER — Ambulatory Visit (INDEPENDENT_AMBULATORY_CARE_PROVIDER_SITE_OTHER): Payer: 59

## 2019-09-08 ENCOUNTER — Encounter: Payer: Self-pay | Admitting: Physical Therapy

## 2019-09-08 DIAGNOSIS — M25671 Stiffness of right ankle, not elsewhere classified: Secondary | ICD-10-CM

## 2019-09-08 DIAGNOSIS — S82871C Displaced pilon fracture of right tibia, initial encounter for open fracture type IIIA, IIIB, or IIIC: Secondary | ICD-10-CM

## 2019-09-08 DIAGNOSIS — R262 Difficulty in walking, not elsewhere classified: Secondary | ICD-10-CM

## 2019-09-08 DIAGNOSIS — R600 Localized edema: Secondary | ICD-10-CM

## 2019-09-08 NOTE — Therapy (Addendum)
[name], [place], [address] Phone: [phone]   Fax:  [phone]  Physical Therapy Treatment  Patient Details  Name: Jonathan Bass MRN: [id number] Date of Birth: [birth year] Referring Provider (PT): Newt Minion, MD   Encounter Date: 09/08/2019  PT End of Session - 09/08/19 1258    Visit Number  2    Number of Visits  16    Date for PT Re-Evaluation  10/30/19    Authorization Type  UHC    PT Start Time  1151    PT Stop Time  1245    PT Time Calculation (min)  54 min    Activity Tolerance  Patient tolerated treatment well    Behavior During Therapy  [facility] for tasks assessed/performed       Past Medical History:  Diagnosis Date  . Anxiety   . GERD (gastroesophageal reflux disease)    02/21/2019- not current   . Pulmonary nodule 10/2014   Noted on CT scan -> stable and follow-up 2019.  No further evaluation necessary.    Past Surgical History:  Procedure Laterality Date  . APPLICATION OF WOUND VAC Right 06/10/2019   Procedure: Application Of Wound Vac -Prevena;  Surgeon: Newt Minion, MD;  Location: [place];  Service: Orthopedics;  Laterality: Right;  . CARDIAC EVENT MONITOR  01/2019   redominantly NSR with average rate 60 bpm.  One short 4 beat run of NSVT.  Minimal PACs and PVCs noted.  No sustained arrhythmia.  Marland Kitchen CLOSED REDUCTION WRIST FRACTURE Right 03/11/2019   Procedure: CLOSED REDUCTION WRIST;  Surgeon: Shona Needles, MD;  Location: [place];  Service: Orthopedics;  Laterality: Right;  . COLONOSCOPY    . EXTERNAL FIXATION LEG Right 03/11/2019   Procedure: EXTERNAL FIXATION LEG;  Surgeon: Shona Needles, MD;  Location: [place];  Service: Orthopedics;  Laterality: Right;  . EXTERNAL FIXATION REMOVAL Right 05/08/2019   Procedure: REMOVAL EXTERNAL FIXATION ANKLE, PLACEMENT OF CAST;  Surgeon: Shona Needles, MD;  Location: [place];  Service: Orthopedics;  Laterality: Right;  . FOOT SURGERY Bilateral     hammer toe  . HERNIA REPAIR Bilateral 2009   Inguinial  . I&D EXTREMITY Right 03/11/2019   Procedure: IRRIGATION AND DEBRIDEMENT EXTREMITY;  Surgeon: Shona Needles, MD;  Location: [place];  Service: Orthopedics;  Laterality: Right;  . IR FLUORO GUIDE CV LINE RIGHT  07/09/2019  . OPEN REDUCTION INTERNAL FIXATION (ORIF) TIBIA/FIBULA FRACTURE Right 06/10/2019   Procedure: EXCISION RIGHT TIBIA AND FIBULA, WOUND DEBRIDEMENT, AND FIXATION OF TIBIA AND FIBULA;  Surgeon: Newt Minion, MD;  Location: [place];  Service: Orthopedics;  Laterality: Right;  . OPEN REDUCTION INTERNAL FIXATION (ORIF) TIBIA/FIBULA FRACTURE Right 06/12/2019   Procedure: DEBRIDEMENT AND REVISION INTERNAL FIXATION INFECTED RIGHT PILON FRACTURE;  Surgeon: Newt Minion, MD;  Location: [place];  Service: Orthopedics;  Laterality: Right;  . ORIF ANKLE FRACTURE Right 03/11/2019   Procedure: OPEN REDUCTION INTERNAL FIXATION (ORIF) ANKLE FRACTURE;  Surgeon: Shona Needles, MD;  Location: [place];  Service: Orthopedics;  Laterality: Right;  . ORIF WRIST FRACTURE Right 03/11/2019   Procedure: OPEN REDUCTION INTERNAL FIXATION (ORIF) WRIST FRACTURE;  Surgeon: Shona Needles, MD;  Location: [place];  Service: Orthopedics;  Laterality: Right;  . TONSILLECTOMY    . TRANSTHORACIC ECHOCARDIOGRAM  01/2019   EF 55 to 60%.  Normal wall motion.  Indeterminate likely grade 1 diastolic function.  Mild degenerative mitral valve disease  with thickening but no prolapse.    There were no vitals filed for this visit.  Subjective Assessment - 09/08/19 1154    Subjective  When I get up in the AM, I am able to flex a little better to walk to bathroom. The front of my ankle feels really tight and stiff as it swells over the course of the day. HAve been doing my stationary bike with tennis shoes on and I can feel the LLD.    Currently in Pain?  No/denies         [facility] PT Assessment - 09/08/19 0001      Assessment   Medical Diagnosis  s/p revision fusion  open grade 3 pilon fracture right ankle.    Referring Provider (PT)  Nadara Mustard, MD                   [facility] Adult PT Treatment/Exercise - 09/08/19 0001      Vasopneumatic   Number Minutes Vasopneumatic   15 minutes    Vasopnuematic Location   Ankle    Vasopneumatic Pressure  Low    Vasopneumatic Temperature   32      Manual Therapy   Manual Therapy  Joint mobilization;Soft tissue mobilization;Edema management    Edema Management  around ankle    Joint Mobilization  great toe extension    Soft tissue mobilization  plantar fascia      Ankle Exercises: Seated   Heel Raises  Both;20 reps    Other Seated Ankle Exercises  seated heel slides      Ankle Exercises: Supine   Isometrics  ankle 4 way by PT               PT Short Term Goals - 08/21/19 1358      PT SHORT TERM GOAL #1   Title  Pt will demo heel/toe gait pattern in tennis shoes and LRAD for short distances    Baseline  cam boot and bil axillary crutches at eval    Time  4    Period  Weeks    Status  New    Target Date  09/18/19      PT SHORT TERM GOAL #2   Title  pt will tolerate at least 50% weight bearing through RLE (measured on scale) with good control of proximal posture    Baseline  has not tried at eval    Time  4    Period  Weeks    Status  New    Target Date  09/18/19      PT SHORT TERM GOAL #3   Title  pt will demo controlled AROM all 4 directions in ankle in OKC    Baseline  limited in DF/PF and no activation noted into inversion/eversion    Time  4    Period  Weeks    Status  New    Target Date  09/18/19        PT Long Term Goals - 08/21/19 1402      PT LONG TERM GOAL #1   Title  to be set at short term mark depending on progress and healing.            Plan - 09/08/19 1255    Clinical Impression Statement  Worked on general mobility today and has is follow up with Dr Lajoyce Corners this afternoon. Notable lack of great toe extension that will limit gait ability if not  addressed. Added isometrics to  HEP to begin activating surrounding musculature. Proper gait pattern with boots and bil axillary crutches but is limited in weight bearing. Advised that he will have to be cleared for heel lift and of boot before we can address LLD but to keep core/hip strong in the mean time.    PT Treatment/Interventions  ADLs/Self Care Home Management;Cryotherapy;Electrical Stimulation;Gait training;Functional mobility Network engineer;Therapeutic activities;Therapeutic exercise;Balance training;Patient/family education;Neuromuscular re-education;Manual techniques;Passive range of motion;Taping;Vasopneumatic Device    PT Next Visit Plan  outcome of Dr Lajoyce Corners apt? continue great toe mobility    PT Home Exercise Plan  ice/elevate 3/day, bike, hip strengthening, isometrics, heel slides-seated, great toe extension    Consulted and Agree with Plan of Care  Patient       Patient will benefit from skilled therapeutic intervention in order to improve the following deficits and impairments:  Abnormal gait, Decreased range of motion, Difficulty walking, Increased muscle spasms, Decreased activity tolerance, Pain, Improper body mechanics, Impaired flexibility, Hypomobility, Decreased scar mobility, Decreased balance, Decreased mobility, Decreased strength, Increased edema, Postural dysfunction  Visit Diagnosis: Stiffness of right ankle, not elsewhere classified  Localized edema  Difficulty in walking, not elsewhere classified     Problem List Patient Active Problem List   Diagnosis Date Noted  . Pilon fracture of right tibia, sequela 06/10/2019  . Displaced pilon fracture of right tibia, initial encounter for open fracture type IIIA, IIIB, or IIIC   . Subacute osteomyelitis of right tibia (HCC)   . Surgery, elective   . Anxiety   . Open pilon fracture, right, type III, initial encounter 03/11/2019  . Closed fracture of right distal radius 03/11/2019  . Type III open pilon  fracture of right tibia 03/11/2019  . Pounding heartbeat 01/21/2019  . Precordial chest pain 03/05/2014    Myrtle Haller C. Naria Abbey PT, DPT 09/08/19 1:10 PM   [facility] [address] [place], [place], [address] Phone: [phone]   Fax:  [phone]  Name: Jonathan Bass MRN: [id number] Date of Birth: 14-Feb-1961

## 2019-09-08 NOTE — Progress Notes (Signed)
Office Visit Note   Patient: Jonathan Bass           Date of Birth: 29-Nov-1961           MRN: [id number] Visit Date: 09/08/2019              Requested by: Juluis Rainier, MD [address] [place],  [place] [address] PCP: Juluis Rainier, MD  Chief Complaint  Patient presents with  . Right Ankle - Routine Post Op    06/12/2019 Open pilon right ankle fusion      HPI: Patient is a 58 year old gentleman who presents about 3 months status post revision internal fixation for infected pilon fracture right ankle.  Patient is in a fracture boot using crutches states he still has some swelling he is wearing his medical compression stockings.  Assessment & Plan: Visit Diagnoses:  1. Open pilon fracture, right, type III, initial encounter     Plan: Patient is given a prescription for biotech for extra-depth shoes external 1 inch lift dynamic double upright braces reevaluate in 4 weeks.  Three-view radiographs of the right ankle at follow-up  Follow-Up Instructions: Return in about 4 weeks (around 10/06/2019).   Ortho Exam  Patient is alert, oriented, no adenopathy, well-dressed, normal affect, normal respiratory effort. Examination patient's wounds have healed nicely he does have venous swelling but there is no cellulitis no drainage no signs of infection his foot is plantigrade he does have motion through the midfoot and hindfoot.  Imaging: Xr Ankle Complete Right  Result Date: 09/08/2019 3 view radiographs of the right ankle shows stable internal fixation no hardware failure.    Labs: Lab Results  Component Value Date   ESRSEDRATE 6 08/25/2019   CRP 7.5 08/25/2019   REPTSTATUS 06/17/2019 FINAL 06/12/2019   GRAMSTAIN  06/12/2019    RARE WBC PRESENT, PREDOMINANTLY PMN NO ORGANISMS SEEN    CULT  06/12/2019    RARE PSEUDOMONAS PUTIDA NO ANAEROBES ISOLATED CRITICAL RESULT CALLED TO, READ BACK BY AND VERIFIED WITH: RN Gaynelle Cage  [id number] AT 1315 BY CM Performed at  [facility], [address], [place], [place] [address]    LABORGA PSEUDOMONAS PUTIDA 06/12/2019     Lab Results  Component Value Date   ALBUMIN 3.3 (L) 06/14/2019   ALBUMIN 4.0 03/11/2019   ALBUMIN 4.0 01/19/2012    No results found for: MG No results found for: VD25OH  No results found for: PREALBUMIN CBC EXTENDED Latest Ref Rng & Units 06/10/2019 05/08/2019 03/12/2019  WBC 4.0 - 10.5 K/uL - 6.4 14.0(H)  RBC 4.22 - 5.81 MIL/uL - 4.61 4.17(L)  HGB 13.0 - 17.0 g/dL 31.5 17.6 12.4(L)  HCT 39.0 - 52.0 % - 41.9 38.1(L)  PLT 150 - 400 K/uL - 238 206  NEUTROABS 1.7 - 7.7 K/uL - - -  LYMPHSABS 0.7 - 4.0 K/uL - - -     There is no height or weight on file to calculate BMI.  Orders:  Orders Placed This Encounter  Procedures  . XR Ankle Complete Right   No orders of the defined types were placed in this encounter.    Procedures: No procedures performed  Clinical Data: No additional findings.  ROS:  All other systems negative, except as noted in the HPI. Review of Systems  Objective: Vital Signs: There were no vitals taken for this visit.  Specialty Comments:  No specialty comments available.  PMFS History: Patient Active Problem List   Diagnosis Date Noted  .  Pilon fracture of right tibia, sequela 06/10/2019  . Displaced pilon fracture of right tibia, initial encounter for open fracture type IIIA, IIIB, or IIIC   . Subacute osteomyelitis of right tibia (HCC)   . Surgery, elective   . Anxiety   . Open pilon fracture, right, type III, initial encounter 03/11/2019  . Closed fracture of right distal radius 03/11/2019  . Type III open pilon fracture of right tibia 03/11/2019  . Pounding heartbeat 01/21/2019  . Precordial chest pain 03/05/2014   Past Medical History:  Diagnosis Date  . Anxiety   . GERD (gastroesophageal reflux disease)    02/21/2019- not current   . Pulmonary nodule 10/2014   Noted on CT scan -> stable and follow-up 2019.  No further  evaluation necessary.    Family History  Problem Relation Age of Onset  . Prostate cancer Father   . Valvular heart disease Father        Had a heart valve replaced (unsure of details)  . Breast cancer Mother   . Parkinson's disease Mother   . Healthy Sister   . Healthy Brother   . Parkinson's disease Maternal Grandmother   . Diabetes Maternal Grandfather     Past Surgical History:  Procedure Laterality Date  . APPLICATION OF WOUND VAC Right 06/10/2019   Procedure: Application Of Wound Vac -Prevena;  Surgeon: Nadara Mustard, MD;  Location[facility] OR;  Service: Orthopedics;  Laterality: Right;  . CARDIAC EVENT MONITOR  01/2019   redominantly NSR with average rate 60 bpm.  One short 4 beat run of NSVT.  Minimal PACs and PVCs noted.  No sustained arrhythmia.  Marland Kitchen CLOSED REDUCTION WRIST FRACTURE Right 03/11/2019   Procedure: CLOSED REDUCTION WRIST;  Surgeon: [name], MD;  [facility];  Service: Orthopedics;  Laterality: Right;  . COLONOSCOPY    . EXTERNAL FIXATION LEG Right 03/11/2019   Procedure: EXTERNAL FIXATION LEG;  Surgeon: [name], MD;  [facility];  Service: Orthopedics;  Laterality: Right;  . EXTERNAL FIXATION REMOVAL Right 05/08/2019   Procedure: REMOVAL EXTERNAL FIXATION ANKLE, PLACEMENT OF CAST;  Surgeon: [name], MD;  [facility];  Service: Orthopedics;  Laterality: Right;  . FOOT SURGERY Bilateral    hammer toe  . HERNIA REPAIR Bilateral 2009   Inguinial  . I&D EXTREMITY Right 03/11/2019   Procedure: IRRIGATION AND DEBRIDEMENT EXTREMITY;  Surgeon: [name], MD;  [facility];  Service: Orthopedics;  Laterality: Right;  . IR FLUORO GUIDE CV LINE RIGHT  07/09/2019  . OPEN REDUCTION INTERNAL FIXATION (ORIF) TIBIA/FIBULA FRACTURE Right 06/10/2019   Procedure: EXCISION RIGHT TIBIA AND FIBULA, WOUND DEBRIDEMENT, AND FIXATION OF TIBIA AND FIBULA;  Surgeon: Nadara Mustard, MD;  [facility];  Service: Orthopedics;  Laterality: Right;  . OPEN  REDUCTION INTERNAL FIXATION (ORIF) TIBIA/FIBULA FRACTURE Right 06/12/2019   Procedure: DEBRIDEMENT AND REVISION INTERNAL FIXATION INFECTED RIGHT PILON FRACTURE;  Surgeon: Nadara Mustard, MD;  [facility];  Service: Orthopedics;  Laterality: Right;  . ORIF ANKLE FRACTURE Right 03/11/2019   Procedure: OPEN REDUCTION INTERNAL FIXATION (ORIF) ANKLE FRACTURE;  Surgeon: [name], MD;  [facility];  Service: Orthopedics;  Laterality: Right;  . ORIF WRIST FRACTURE Right 03/11/2019   Procedure: OPEN REDUCTION INTERNAL FIXATION (ORIF) WRIST FRACTURE;  Surgeon: [name], MD;  [facility];  Service: Orthopedics;  Laterality: Right;  . TONSILLECTOMY    . TRANSTHORACIC ECHOCARDIOGRAM  01/2019   EF  55 to 60%.  Normal wall motion.  Indeterminate likely grade 1 diastolic function.  Mild degenerative mitral valve disease with thickening but no prolapse.   Social History   Occupational History  . Occupation: Freight forwarder at [employer]: [place]  Tobacco Use  . Smoking status: Never Smoker  . Smokeless tobacco: Never Used  Substance and Sexual Activity  . Alcohol use: No  . Drug use: No  . Sexual activity: Not on file

## 2019-09-15 ENCOUNTER — Encounter: Payer: Self-pay | Admitting: Physical Therapy

## 2019-09-15 ENCOUNTER — Ambulatory Visit: Payer: 59 | Admitting: Physical Therapy

## 2019-09-15 ENCOUNTER — Other Ambulatory Visit: Payer: Self-pay

## 2019-09-15 DIAGNOSIS — M25671 Stiffness of right ankle, not elsewhere classified: Secondary | ICD-10-CM

## 2019-09-15 DIAGNOSIS — R600 Localized edema: Secondary | ICD-10-CM

## 2019-09-15 DIAGNOSIS — R262 Difficulty in walking, not elsewhere classified: Secondary | ICD-10-CM

## 2019-09-15 NOTE — Therapy (Signed)
[facility] [address] [place], [place], [address] Phone: [phone]   Fax:  [phone]  Physical Therapy Treatment  Patient Details  Name: Jonathan Bass MRN: [id number] Date of Birth: 02-19-1961 Referring Provider (PT): Jonathan Bass, Jonathan V, MD   Encounter Date: 09/15/2019  PT End of Session - 09/15/19 1438    Visit Number  3    Number of Visits  16    Date for PT Re-Evaluation  10/30/19    Authorization Type  UHC    PT Start Time  1332    PT Stop Time  1415    PT Time Calculation (min)  43 min    Activity Tolerance  Patient tolerated treatment well    Behavior During Therapy  [facility] assessed/performed       Past Medical History:  Diagnosis Date  . Anxiety   . GERD (gastroesophageal reflux disease)    02/21/2019- not current   . Pulmonary nodule 10/2014   Noted on CT scan -> stable and follow-up 2019.  No further evaluation necessary.    Past Surgical History:  Procedure Laterality Date  . APPLICATION OF WOUND VAC Right 06/10/2019   Procedure: Application Of Wound Vac -Prevena;  Surgeon: Jonathan Bass, Jonathan V, MD;  [facility];  Service: Orthopedics;  Laterality: Right;  . CARDIAC EVENT MONITOR  01/2019   redominantly NSR with average rate 60 bpm.  One short 4 beat run of NSVT.  Minimal PACs and PVCs noted.  No sustained arrhythmia.  Marland Kitchen. CLOSED REDUCTION WRIST FRACTURE Right 03/11/2019   Procedure: CLOSED REDUCTION WRIST;  Surgeon: Jonathan Bass, Jonathan P, MD;  [facility];  Service: Orthopedics;  Laterality: Right;  . COLONOSCOPY    . EXTERNAL FIXATION LEG Right 03/11/2019   Procedure: EXTERNAL FIXATION LEG;  Surgeon: Jonathan Bass, Jonathan P, MD;  [facility];  Service: Orthopedics;  Laterality: Right;  . EXTERNAL FIXATION REMOVAL Right 05/08/2019   Procedure: REMOVAL EXTERNAL FIXATION ANKLE, PLACEMENT OF CAST;  Surgeon: Jonathan Bass, Jonathan P, MD;  [facility];  Service: Orthopedics;  Laterality: Right;  . FOOT SURGERY Bilateral     hammer toe  . HERNIA REPAIR Bilateral 2009   Inguinial  . I&D EXTREMITY Right 03/11/2019   Procedure: IRRIGATION AND DEBRIDEMENT EXTREMITY;  Surgeon: Jonathan Bass, Jonathan P, MD;  [facility];  Service: Orthopedics;  Laterality: Right;  . IR FLUORO GUIDE CV LINE RIGHT  07/09/2019  . OPEN REDUCTION INTERNAL FIXATION (ORIF) TIBIA/FIBULA FRACTURE Right 06/10/2019   Procedure: EXCISION RIGHT TIBIA AND FIBULA, WOUND DEBRIDEMENT, AND FIXATION OF TIBIA AND FIBULA;  Surgeon: Jonathan Bass, Jonathan V, MD;  [facility];  Service: Orthopedics;  Laterality: Right;  . OPEN REDUCTION INTERNAL FIXATION (ORIF) TIBIA/FIBULA FRACTURE Right 06/12/2019   Procedure: DEBRIDEMENT AND REVISION INTERNAL FIXATION INFECTED RIGHT PILON FRACTURE;  Surgeon: Jonathan Bass, Jonathan V, MD;  [facility];  Service: Orthopedics;  Laterality: Right;  . ORIF ANKLE FRACTURE Right 03/11/2019   Procedure: OPEN REDUCTION INTERNAL FIXATION (ORIF) ANKLE FRACTURE;  Surgeon: Jonathan Bass, Jonathan P, MD;  [facility];  Service: Orthopedics;  Laterality: Right;  . ORIF WRIST FRACTURE Right 03/11/2019   Procedure: OPEN REDUCTION INTERNAL FIXATION (ORIF) WRIST FRACTURE;  Surgeon: Jonathan Bass, Jonathan P, MD;  [facility];  Service: Orthopedics;  Laterality: Right;  . TONSILLECTOMY    . TRANSTHORACIC ECHOCARDIOGRAM  01/2019   EF 55 to 60%.  Normal wall motion.  Indeterminate likely grade 1 diastolic function.  Mild degenerative mitral valve disease  with thickening but no prolapse.    There were no vitals filed for this visit.  Subjective Assessment - 09/15/19 1336    Subjective  Fitted for lift and brace this morning. Noted lift needs to be about an inch. Stiff on anterior ankle.    Currently in Pain?  No/denies                       [facility] Adult PT Treatment/Exercise - 09/15/19 0001      Ankle Exercises: Supine   Isometrics  eversion/inversion by PT    Other Supine Ankle Exercises  toe curls/open      Ankle Exercises: Seated   Other Seated  Ankle Exercises  long sitting PF red tband      Ankle Exercises: Stretches   Other Stretch  rocker    Other Stretch  heel slide with DF lift & toe extension on DF slide      Ankle Exercises: Sidelying   Other Sidelying Ankle Exercises  sidelying hip: clam, reverse, ext press with knee flx, circles               PT Short Term Goals - 08/21/19 1358      PT SHORT TERM GOAL #1   Title  Pt will demo heel/toe gait pattern in tennis shoes and LRAD for short distances    Baseline  cam boot and bil axillary crutches at eval    Time  4    Period  Weeks    Status  New    Target Date  09/18/19      PT SHORT TERM GOAL #2   Title  pt will tolerate at least 50% weight bearing through RLE (measured on scale) with good control of proximal posture    Baseline  has not tried at eval    Time  4    Period  Weeks    Status  New    Target Date  09/18/19      PT SHORT TERM GOAL #3   Title  pt will demo controlled AROM all 4 directions in ankle in [place]    Baseline  limited in DF/PF and no activation noted into inversion/eversion    Time  4    Period  Weeks    Status  New    Target Date  09/18/19        PT Long Term Goals - 08/21/19 1402      PT LONG TERM GOAL #1   Title  to be set at short term mark depending on progress and healing.            Plan - 09/15/19 1428    Clinical Impression Statement  ROM is doing well but obviously limited by fascial mobility at incision site. I asked him to continue using axillary crutches while in the boot because he has a more normalized gait pattern through proximal structures while using them. We will plan to progress gait pattern without AD when his braces come in. Added proximal hip strengthening with focus on abductors for lateral support to LE biomechanical chain.    PT Treatment/Interventions  ADLs/Self Care Home Management;Cryotherapy;Electrical Stimulation;Gait training;Functional mobility Scientist, forensic;Therapeutic  activities;Therapeutic exercise;Balance training;Patient/family education;Neuromuscular re-education;Manual techniques;Passive range of motion;Taping;Vasopneumatic Device    PT Next Visit Plan  great toe mobility, continue distraction for talocrural joint, soft tissue mobility, CKC activation of ankle    PT Home Exercise Plan  ice/elevate 3/day, bike, hip strengthening, isometrics, heel slides-seated,  great toe extension; sidelying hip-circles, ext, clams & rev    Consulted and Agree with Plan of Care  Patient       Patient will benefit from skilled therapeutic intervention in order to improve the following deficits and impairments:  Abnormal gait, Decreased range of motion, Difficulty walking, Increased muscle spasms, Decreased activity tolerance, Pain, Improper body mechanics, Impaired flexibility, Hypomobility, Decreased scar mobility, Decreased balance, Decreased mobility, Decreased strength, Increased edema, Postural dysfunction  Visit Diagnosis: Stiffness of right ankle, not elsewhere classified  Localized edema  Difficulty in walking, not elsewhere classified     Problem List Patient Active Problem List   Diagnosis Date Noted  . Pilon fracture of right tibia, sequela 06/10/2019  . Displaced pilon fracture of right tibia, initial encounter for open fracture type IIIA, IIIB, or IIIC   . Subacute osteomyelitis of right tibia (HCC)   . Surgery, elective   . Anxiety   . Open pilon fracture, right, type III, initial encounter 03/11/2019  . Closed fracture of right distal radius 03/11/2019  . Type III open pilon fracture of right tibia 03/11/2019  . Pounding heartbeat 01/21/2019  . Precordial chest pain 03/05/2014    Jess Toney C. Saadiya Wilfong PT, DPT 09/15/19 2:39 PM   [facility] [address] [place], [place], [address] Phone: [phone]   Fax:  [phone]  Name: Jonathan Bass MRN: [id number] Date of Birth:  04/15/1961

## 2019-09-22 ENCOUNTER — Other Ambulatory Visit: Payer: Self-pay

## 2019-09-22 ENCOUNTER — Encounter: Payer: Self-pay | Admitting: Physical Therapy

## 2019-09-22 ENCOUNTER — Ambulatory Visit: Payer: 59 | Attending: Orthopedic Surgery | Admitting: Physical Therapy

## 2019-09-22 DIAGNOSIS — R262 Difficulty in walking, not elsewhere classified: Secondary | ICD-10-CM | POA: Diagnosis present

## 2019-09-22 DIAGNOSIS — R600 Localized edema: Secondary | ICD-10-CM | POA: Diagnosis present

## 2019-09-22 DIAGNOSIS — M25671 Stiffness of right ankle, not elsewhere classified: Secondary | ICD-10-CM | POA: Diagnosis present

## 2019-09-22 NOTE — Therapy (Signed)
Jonathan Bass, [place], [address] Phone: [phone]   Fax:  [phone]  Physical Therapy Treatment  Patient Details  Name: Jonathan Bass MRN: [id number] Date of Birth: June 15, 1961 Referring Provider (PT): Newt Minion, MD   Encounter Date: 09/22/2019  PT End of Session - 09/22/19 1416    Visit Number  4    Number of Visits  16    Date for PT Re-Evaluation  10/30/19    Authorization Type  UHC    PT Start Time  1330    PT Stop Time  1414    PT Time Calculation (min)  44 min    Activity Tolerance  Patient tolerated treatment well    Behavior During Therapy  [facility] for tasks assessed/performed       Past Medical History:  Diagnosis Date  . Anxiety   . GERD (gastroesophageal reflux disease)    02/21/2019- not current   . Pulmonary nodule 10/2014   Noted on CT scan -> stable and follow-up 2019.  No further evaluation necessary.    Past Surgical History:  Procedure Laterality Date  . APPLICATION OF WOUND VAC Right 06/10/2019   Procedure: Application Of Wound Vac -Prevena;  Surgeon: Newt Minion, MD;  Location: [place];  Service: Orthopedics;  Laterality: Right;  . CARDIAC EVENT MONITOR  01/2019   redominantly NSR with average rate 60 bpm.  One short 4 beat run of NSVT.  Minimal PACs and PVCs noted.  No sustained arrhythmia.  Marland Kitchen CLOSED REDUCTION WRIST FRACTURE Right 03/11/2019   Procedure: CLOSED REDUCTION WRIST;  Surgeon: Shona Needles, MD;  Location: [place];  Service: Orthopedics;  Laterality: Right;  . COLONOSCOPY    . EXTERNAL FIXATION LEG Right 03/11/2019   Procedure: EXTERNAL FIXATION LEG;  Surgeon: Shona Needles, MD;  Location: [place];  Service: Orthopedics;  Laterality: Right;  . EXTERNAL FIXATION REMOVAL Right 05/08/2019   Procedure: REMOVAL EXTERNAL FIXATION ANKLE, PLACEMENT OF CAST;  Surgeon: Shona Needles, MD;  Location: [place];  Service: Orthopedics;  Laterality: Right;  . FOOT SURGERY Bilateral     hammer toe  . HERNIA REPAIR Bilateral 2009   Inguinial  . I&D EXTREMITY Right 03/11/2019   Procedure: IRRIGATION AND DEBRIDEMENT EXTREMITY;  Surgeon: Shona Needles, MD;  Location: [place];  Service: Orthopedics;  Laterality: Right;  . IR FLUORO GUIDE CV LINE RIGHT  07/09/2019  . OPEN REDUCTION INTERNAL FIXATION (ORIF) TIBIA/FIBULA FRACTURE Right 06/10/2019   Procedure: EXCISION RIGHT TIBIA AND FIBULA, WOUND DEBRIDEMENT, AND FIXATION OF TIBIA AND FIBULA;  Surgeon: Newt Minion, MD;  Location: [place];  Service: Orthopedics;  Laterality: Right;  . OPEN REDUCTION INTERNAL FIXATION (ORIF) TIBIA/FIBULA FRACTURE Right 06/12/2019   Procedure: DEBRIDEMENT AND REVISION INTERNAL FIXATION INFECTED RIGHT PILON FRACTURE;  Surgeon: Newt Minion, MD;  Location: [place];  Service: Orthopedics;  Laterality: Right;  . ORIF ANKLE FRACTURE Right 03/11/2019   Procedure: OPEN REDUCTION INTERNAL FIXATION (ORIF) ANKLE FRACTURE;  Surgeon: Shona Needles, MD;  Location: [place];  Service: Orthopedics;  Laterality: Right;  . ORIF WRIST FRACTURE Right 03/11/2019   Procedure: OPEN REDUCTION INTERNAL FIXATION (ORIF) WRIST FRACTURE;  Surgeon: Shona Needles, MD;  Location: [place];  Service: Orthopedics;  Laterality: Right;  . TONSILLECTOMY    . TRANSTHORACIC ECHOCARDIOGRAM  01/2019   EF 55 to 60%.  Normal wall motion.  Indeterminate likely grade 1 diastolic function.  Mild degenerative mitral valve disease  with thickening but no prolapse.    There were no vitals filed for this visit.  Subjective Assessment - 09/22/19 1334    Subjective  I feel like I have built up a little more muscle in the back.    Currently in Pain?  No/denies         [facility] PT Assessment - 09/22/19 0001      ROM / Strength   AROM / PROM / Strength  PROM      AROM   Overall AROM Comments  --    AROM Assessment Site  Other (comment)    Right Ankle Dorsiflexion  2    Right Ankle Plantar Flexion  20      PROM   Overall PROM Comments  great toe  ext 22    PROM Assessment Site  Ankle    Right/Left Ankle  Right    Right Ankle Dorsiflexion  4    Right Ankle Plantar Flexion  24                   [facility] Adult PT Treatment/Exercise - 09/22/19 0001      Manual Therapy   Manual therapy comments  ankle PROM    Joint Mobilization  great toe flx/ext, talocrural mobilization    Soft tissue mobilization  scar mobilization, IASTM plantar fascia.       Ankle Exercises: Supine   Isometrics  DF, PF               PT Short Term Goals - 08/21/19 1358      PT SHORT TERM GOAL #1   Title  Pt will demo heel/toe gait pattern in tennis shoes and LRAD for short distances    Baseline  cam boot and bil axillary crutches at eval    Time  4    Period  Weeks    Status  New    Target Date  09/18/19      PT SHORT TERM GOAL #2   Title  pt will tolerate at least 50% weight bearing through RLE (measured on scale) with good control of proximal posture    Baseline  has not tried at eval    Time  4    Period  Weeks    Status  New    Target Date  09/18/19      PT SHORT TERM GOAL #3   Title  pt will demo controlled AROM all 4 directions in ankle in OKC    Baseline  limited in DF/PF and no activation noted into inversion/eversion    Time  4    Period  Weeks    Status  New    Target Date  09/18/19        PT Long Term Goals - 08/21/19 1402      PT LONG TERM GOAL #1   Title  to be set at short term mark depending on progress and healing.            Plan - 09/22/19 1416    Clinical Impression Statement  Notable improvement in great toe and talocrural mobility when measured today. Will continue to focus on manual treatment to encourage mobility gains and muscle activation so he is prepared for gait when braces come in.    PT Treatment/Interventions  ADLs/Self Care Home Management;Cryotherapy;Electrical Stimulation;Gait training;Functional mobility Network engineer;Therapeutic activities;Therapeutic exercise;Balance  training;Patient/family education;Neuromuscular re-education;Manual techniques;Passive range of motion;Taping;Vasopneumatic Device    PT Next Visit Plan  great toe mobility,  continue distraction for talocrural joint, soft tissue mobility, BAPS    PT Home Exercise Plan  ice/elevate 3/day, bike, hip strengthening, isometrics, heel slides-seated, great toe extension; sidelying hip-circles, ext, clams & rev    Consulted and Agree with Plan of Care  Patient       Patient will benefit from skilled therapeutic intervention in order to improve the following deficits and impairments:  Abnormal gait, Decreased range of motion, Difficulty walking, Increased muscle spasms, Decreased activity tolerance, Pain, Improper body mechanics, Impaired flexibility, Hypomobility, Decreased scar mobility, Decreased balance, Decreased mobility, Decreased strength, Increased edema, Postural dysfunction  Visit Diagnosis: Stiffness of right ankle, not elsewhere classified  Localized edema  Difficulty in walking, not elsewhere classified     Problem List Patient Active Problem List   Diagnosis Date Noted  . Pilon fracture of right tibia, sequela 06/10/2019  . Displaced pilon fracture of right tibia, initial encounter for open fracture type IIIA, IIIB, or IIIC   . Subacute osteomyelitis of right tibia (HCC)   . Surgery, elective   . Anxiety   . Open pilon fracture, right, type III, initial encounter 03/11/2019  . Closed fracture of right distal radius 03/11/2019  . Type III open pilon fracture of right tibia 03/11/2019  . Pounding heartbeat 01/21/2019  . Precordial chest pain 03/05/2014   Victormanuel Mclure C. Evanne Matsunaga PT, DPT 09/22/19 2:19 PM   [facility] [address] [place], [place], [address] Phone: [phone]   Fax:  [phone]  Name: Aldean AstStanley J Kruse MRN: [id number] Date of Birth: 17-Nov-1961

## 2019-10-06 ENCOUNTER — Other Ambulatory Visit: Payer: Self-pay

## 2019-10-06 ENCOUNTER — Encounter: Payer: Self-pay | Admitting: Orthopedic Surgery

## 2019-10-06 ENCOUNTER — Ambulatory Visit: Payer: Self-pay

## 2019-10-06 ENCOUNTER — Ambulatory Visit (INDEPENDENT_AMBULATORY_CARE_PROVIDER_SITE_OTHER): Payer: 59 | Admitting: Orthopedic Surgery

## 2019-10-06 VITALS — Ht 71.0 in | Wt 203.0 lb

## 2019-10-06 DIAGNOSIS — M25571 Pain in right ankle and joints of right foot: Secondary | ICD-10-CM

## 2019-10-06 DIAGNOSIS — S82871C Displaced pilon fracture of right tibia, initial encounter for open fracture type IIIA, IIIB, or IIIC: Secondary | ICD-10-CM | POA: Diagnosis not present

## 2019-10-06 NOTE — Progress Notes (Signed)
Office Visit Note   Patient: Jonathan Bass           Date of Birth: Jul 10, 1961           MRN: [id number] Visit Date: 10/06/2019              Requested by: Juluis RainierBarnes, Elizabeth, MD [address] [place],  [place] [address] PCP: Juluis RainierBarnes, Elizabeth, MD  Chief Complaint  Patient presents with  . Right Ankle - Follow-up      HPI: Patient is a 58 year old gentleman status post open pilon fracture status post debridement revision wound closure.  Patient has been ambulating with a 1 inch shoe lift on the right foot he states his pelvis feels level with this.  He is still using 2 crutches.  Assessment & Plan: Visit Diagnoses:  1. Pain in right ankle and joints of right foot   2. Open pilon fracture, right, type III, initial encounter     Plan: Recommend advancing to [place] crutches continue to increase his activities as tolerated continue with strengthening proprioception and balance.  Follow-Up Instructions: Return if symptoms worsen or fail to improve.   Ortho Exam  Patient is alert, oriented, no adenopathy, well-dressed, normal affect, normal respiratory effort. Examination the skin continues to remodel and improve there is no open wound no cellulitis the swelling has decreased.  Patient's foot is plantigrade.  Patient has weakness and will require continued protective weightbearing until the strength improves.  Imaging: Xr Ankle Complete Right  Result Date: 10/06/2019 3 view radiographs of the right ankle shows stable bony fusion across the osteotomy site.  The mortise is congruent.  No hardware failure.    Labs: Lab Results  Component Value Date   ESRSEDRATE 6 08/25/2019   CRP 7.5 08/25/2019   REPTSTATUS 06/17/2019 FINAL 06/12/2019   GRAMSTAIN  06/12/2019    RARE WBC PRESENT, PREDOMINANTLY PMN NO ORGANISMS SEEN    CULT  06/12/2019    RARE PSEUDOMONAS PUTIDA NO ANAEROBES ISOLATED CRITICAL RESULT CALLED TO, READ BACK BY AND VERIFIED WITH: RN Gaynelle CageSABRINA S  [phone]  AT 1315 BY CM Performed at [facility], [address] [place], [place] [address]    LABORGA PSEUDOMONAS PUTIDA 06/12/2019     Lab Results  Component Value Date   ALBUMIN 3.3 (L) 06/14/2019   ALBUMIN 4.0 03/11/2019   ALBUMIN 4.0 01/19/2012    No results found for: MG No results found for: VD25OH  No results found for: PREALBUMIN CBC EXTENDED Latest Ref Rng & Units 06/10/2019 05/08/2019 03/12/2019  WBC 4.0 - 10.5 K/uL - 6.4 14.0(H)  RBC 4.22 - 5.81 MIL/uL - 4.61 4.17(L)  HGB 13.0 - 17.0 g/dL 13.014.7 86.513.7 12.4(L)  HCT 39.0 - 52.0 % - 41.9 38.1(L)  PLT 150 - 400 K/uL - 238 206  NEUTROABS 1.7 - 7.7 K/uL - - -  LYMPHSABS 0.7 - 4.0 K/uL - - -     Body mass index is 28.31 kg/m.  Orders:  Orders Placed This Encounter  Procedures  . XR Ankle Complete Right   No orders of the defined types were placed in this encounter.    Procedures: No procedures performed  Clinical Data: No additional findings.  ROS:  All other systems negative, except as noted in the HPI. Review of Systems  Objective: Vital Signs: Ht 5\' 11"  (1.803 m)   Wt 203 lb (92.1 kg)   BMI 28.31 kg/m   Specialty Comments:  No specialty comments available.  PMFS History:  Patient Active Problem List   Diagnosis Date Noted  . Pilon fracture of right tibia, sequela 06/10/2019  . Displaced pilon fracture of right tibia, initial encounter for open fracture type IIIA, IIIB, or IIIC   . Subacute osteomyelitis of right tibia (HCC)   . Surgery, elective   . Anxiety   . Open pilon fracture, right, type III, initial encounter 03/11/2019  . Closed fracture of right distal radius 03/11/2019  . Type III open pilon fracture of right tibia 03/11/2019  . Pounding heartbeat 01/21/2019  . Precordial chest pain 03/05/2014   Past Medical History:  Diagnosis Date  . Anxiety   . GERD (gastroesophageal reflux disease)    02/21/2019- not current   . Pulmonary nodule 10/2014   Noted on CT scan -> stable and  follow-up 2019.  No further evaluation necessary.    Family History  Problem Relation Age of Onset  . Prostate cancer Father   . Valvular heart disease Father        Had a heart valve replaced (unsure of details)  . Breast cancer Mother   . Parkinson's disease Mother   . Healthy Sister   . Healthy Brother   . Parkinson's disease Maternal Grandmother   . Diabetes Maternal Grandfather     Past Surgical History:  Procedure Laterality Date  . APPLICATION OF WOUND VAC Right 06/10/2019   Procedure: Application Of Wound Vac -Prevena;  Surgeon: Nadara Mustard, MD;  Location: [facility] OR;  Service: Orthopedics;  Laterality: Right;  . CARDIAC EVENT MONITOR  01/2019   redominantly NSR with average rate 60 bpm.  One short 4 beat run of NSVT.  Minimal PACs and PVCs noted.  No sustained arrhythmia.  Marland Kitchen CLOSED REDUCTION WRIST FRACTURE Right 03/11/2019   Procedure: CLOSED REDUCTION WRIST;  Surgeon: [name], MD;  [facility];  Service: Orthopedics;  Laterality: Right;  . COLONOSCOPY    . EXTERNAL FIXATION LEG Right 03/11/2019   Procedure: EXTERNAL FIXATION LEG;  Surgeon: [name], MD;  [facility];  Service: Orthopedics;  Laterality: Right;  . EXTERNAL FIXATION REMOVAL Right 05/08/2019   Procedure: REMOVAL EXTERNAL FIXATION ANKLE, PLACEMENT OF CAST;  Surgeon: [name], MD;  [facility];  Service: Orthopedics;  Laterality: Right;  . FOOT SURGERY Bilateral    hammer toe  . HERNIA REPAIR Bilateral 2009   Inguinial  . I&D EXTREMITY Right 03/11/2019   Procedure: IRRIGATION AND DEBRIDEMENT EXTREMITY;  Surgeon: [name], MD;  [facility];  Service: Orthopedics;  Laterality: Right;  . IR FLUORO GUIDE CV LINE RIGHT  07/09/2019  . OPEN REDUCTION INTERNAL FIXATION (ORIF) TIBIA/FIBULA FRACTURE Right 06/10/2019   Procedure: EXCISION RIGHT TIBIA AND FIBULA, WOUND DEBRIDEMENT, AND FIXATION OF TIBIA AND FIBULA;  Surgeon: Nadara Mustard, MD;  [facility];  Service: Orthopedics;   Laterality: Right;  . OPEN REDUCTION INTERNAL FIXATION (ORIF) TIBIA/FIBULA FRACTURE Right 06/12/2019   Procedure: DEBRIDEMENT AND REVISION INTERNAL FIXATION INFECTED RIGHT PILON FRACTURE;  Surgeon: Nadara Mustard, MD;  Location: [facility] OR;  Service: Orthopedics;  Laterality: Right;  . ORIF ANKLE FRACTURE Right 03/11/2019   Procedure: OPEN REDUCTION INTERNAL FIXATION (ORIF) ANKLE FRACTURE;  Surgeon: [name], MD;  [facility];  Service: Orthopedics;  Laterality: Right;  . ORIF WRIST FRACTURE Right 03/11/2019   Procedure: OPEN REDUCTION INTERNAL FIXATION (ORIF) WRIST FRACTURE;  Surgeon: [name], MD;  [facility];  Service: Orthopedics;  Laterality: Right;  . TONSILLECTOMY    .  TRANSTHORACIC ECHOCARDIOGRAM  01/2019   EF 55 to 60%.  Normal wall motion.  Indeterminate likely grade 1 diastolic function.  Mild degenerative mitral valve disease with thickening but no prolapse.   Social History   Occupational History  . Occupation: Freight forwarder at [employer]: [place]  Tobacco Use  . Smoking status: Never Smoker  . Smokeless tobacco: Never Used  Substance and Sexual Activity  . Alcohol use: No  . Drug use: No  . Sexual activity: Not on file

## 2019-10-07 ENCOUNTER — Encounter: Payer: Self-pay | Admitting: Physical Therapy

## 2019-10-07 ENCOUNTER — Ambulatory Visit: Payer: 59 | Admitting: Physical Therapy

## 2019-10-07 DIAGNOSIS — M25671 Stiffness of right ankle, not elsewhere classified: Secondary | ICD-10-CM

## 2019-10-07 DIAGNOSIS — R600 Localized edema: Secondary | ICD-10-CM

## 2019-10-07 DIAGNOSIS — R262 Difficulty in walking, not elsewhere classified: Secondary | ICD-10-CM

## 2019-10-07 NOTE — Therapy (Signed)
[facility] [address] [place], [place], [address] Phone: [phone]   Fax:  [phone]  Physical Therapy Treatment  Patient Details  Name: Jonathan Bass MRN: [id number] Date of Birth: 1961-09-11 Referring Provider (PT): Nadara Mustarduda, Marcus V, MD   Encounter Date: 10/07/2019  PT End of Session - 10/07/19 1535    Visit Number  5    Number of Visits  16    Date for PT Re-Evaluation  10/30/19    Authorization Type  UHC    PT Start Time  1515    PT Stop Time  1600    PT Time Calculation (min)  45 min    Activity Tolerance  Patient tolerated treatment well    Behavior During Therapy  [facility] for tasks assessed/performed       Past Medical History:  Diagnosis Date  . Anxiety   . GERD (gastroesophageal reflux disease)    02/21/2019- not current   . Pulmonary nodule 10/2014   Noted on CT scan -> stable and follow-up 2019.  No further evaluation necessary.    Past Surgical History:  Procedure Laterality Date  . APPLICATION OF WOUND VAC Right 06/10/2019   Procedure: Application Of Wound Vac -Prevena;  Surgeon: Nadara Mustarduda, Marcus V, MD;  [facility];  Service: Orthopedics;  Laterality: Right;  . CARDIAC EVENT MONITOR  01/2019   redominantly NSR with average rate 60 bpm.  One short 4 beat run of NSVT.  Minimal PACs and PVCs noted.  No sustained arrhythmia.  Marland Kitchen. CLOSED REDUCTION WRIST FRACTURE Right 03/11/2019   Procedure: CLOSED REDUCTION WRIST;  Surgeon: Roby LoftsHaddix, Kevin P, MD;  [facility];  Service: Orthopedics;  Laterality: Right;  . COLONOSCOPY    . EXTERNAL FIXATION LEG Right 03/11/2019   Procedure: EXTERNAL FIXATION LEG;  Surgeon: Roby LoftsHaddix, Kevin P, MD;  [facility];  Service: Orthopedics;  Laterality: Right;  . EXTERNAL FIXATION REMOVAL Right 05/08/2019   Procedure: REMOVAL EXTERNAL FIXATION ANKLE, PLACEMENT OF CAST;  Surgeon: Roby LoftsHaddix, Kevin P, MD;  [facility];  Service: Orthopedics;  Laterality: Right;  . FOOT SURGERY  Bilateral    hammer toe  . HERNIA REPAIR Bilateral 2009   Inguinial  . I&D EXTREMITY Right 03/11/2019   Procedure: IRRIGATION AND DEBRIDEMENT EXTREMITY;  Surgeon: Roby LoftsHaddix, Kevin P, MD;  [facility];  Service: Orthopedics;  Laterality: Right;  . IR FLUORO GUIDE CV LINE RIGHT  07/09/2019  . OPEN REDUCTION INTERNAL FIXATION (ORIF) TIBIA/FIBULA FRACTURE Right 06/10/2019   Procedure: EXCISION RIGHT TIBIA AND FIBULA, WOUND DEBRIDEMENT, AND FIXATION OF TIBIA AND FIBULA;  Surgeon: Nadara Mustarduda, Marcus V, MD;  [facility];  Service: Orthopedics;  Laterality: Right;  . OPEN REDUCTION INTERNAL FIXATION (ORIF) TIBIA/FIBULA FRACTURE Right 06/12/2019   Procedure: DEBRIDEMENT AND REVISION INTERNAL FIXATION INFECTED RIGHT PILON FRACTURE;  Surgeon: Nadara Mustarduda, Marcus V, MD;  [facility];  Service: Orthopedics;  Laterality: Right;  . ORIF ANKLE FRACTURE Right 03/11/2019   Procedure: OPEN REDUCTION INTERNAL FIXATION (ORIF) ANKLE FRACTURE;  Surgeon: Roby LoftsHaddix, Kevin P, MD;  [facility];  Service: Orthopedics;  Laterality: Right;  . ORIF WRIST FRACTURE Right 03/11/2019   Procedure: OPEN REDUCTION INTERNAL FIXATION (ORIF) WRIST FRACTURE;  Surgeon: Roby LoftsHaddix, Kevin P, MD;  [facility];  Service: Orthopedics;  Laterality: Right;  . TONSILLECTOMY    . TRANSTHORACIC ECHOCARDIOGRAM  01/2019   EF 55 to 60%.  Normal wall motion.  Indeterminate likely grade 1 diastolic function.  Mild degenerative mitral valve disease  with thickening but no prolapse.    There were no vitals filed for this visit.      [facility] PT Assessment - 10/07/19 0001      Assessment   Medical Diagnosis  s/p revision fusion open grade 3 pilon fracture right ankle.    Referring Provider (PT)  Nadara Mustard, MD      ROM / Strength   AROM / PROM / Strength  Strength      Strength   Overall Strength Comments  DF & knee ext 4/5                   [facility] Adult PT Treatment/Exercise - 10/07/19 0001      Manual Therapy   Joint Mobilization   talocrural distraction      Ankle Exercises: Standing   Other Standing Ankle Exercises  mini side lunges, a/p weight shift, mini squats with holds, side step & return             PT Education - 10/07/19 1543    Education Details  edema control, scar tissue & effects on ankle ROM       PT Short Term Goals - 08/21/19 1358      PT SHORT TERM GOAL #1   Title  Pt will demo heel/toe gait pattern in tennis shoes and LRAD for short distances    Baseline  cam boot and bil axillary crutches at eval    Time  4    Period  Weeks    Status  New    Target Date  09/18/19      PT SHORT TERM GOAL #2   Title  pt will tolerate at least 50% weight bearing through RLE (measured on scale) with good control of proximal posture    Baseline  has not tried at eval    Time  4    Period  Weeks    Status  New    Target Date  09/18/19      PT SHORT TERM GOAL #3   Title  pt will demo controlled AROM all 4 directions in ankle in [place]    Baseline  limited in DF/PF and no activation noted into inversion/eversion    Time  4    Period  Weeks    Status  New    Target Date  09/18/19        PT Long Term Goals - 08/21/19 1402      PT LONG TERM GOAL #1   Title  to be set at short term mark depending on progress and healing.            Plan - 10/07/19 1605    Clinical Impression Statement  Presented with 1-inch shoe leveler and bil axilary crutches today. progressed into CKC strengthening to decrease instability in gait. Discussed other options rather than crutches and he felt most comfortable with trekking poles. Reports only pain noted on anterior ankle at site of impingement in DF.    PT Treatment/Interventions  ADLs/Self Care Home Management;Cryotherapy;Electrical Stimulation;Gait training;Functional mobility Network engineer;Therapeutic activities;Therapeutic exercise;Balance training;Patient/family education;Neuromuscular re-education;Manual techniques;Passive range of  motion;Taping;Vasopneumatic Device    PT Next Visit Plan  CKC to tolerance, gait training, set LTGs    PT Home Exercise Plan  ice/elevate 3/day, bike, hip strengthening, isometrics, heel slides-seated, great toe extension; sidelying hip-circles, ext, clams & rev, mini squats, mini side lunges, A/P weight shift    Consulted and Agree with Plan of Care  Patient  Patient will benefit from skilled therapeutic intervention in order to improve the following deficits and impairments:  Abnormal gait, Decreased range of motion, Difficulty walking, Increased muscle spasms, Decreased activity tolerance, Pain, Improper body mechanics, Impaired flexibility, Hypomobility, Decreased scar mobility, Decreased balance, Decreased mobility, Decreased strength, Increased edema, Postural dysfunction  Visit Diagnosis: Stiffness of right ankle, not elsewhere classified  Localized edema  Difficulty in walking, not elsewhere classified     Problem List Patient Active Problem List   Diagnosis Date Noted  . Pilon fracture of right tibia, sequela 06/10/2019  . Displaced pilon fracture of right tibia, initial encounter for open fracture type IIIA, IIIB, or IIIC   . Subacute osteomyelitis of right tibia ([place])   . Surgery, elective   . Anxiety   . Open pilon fracture, right, type III, initial encounter 03/11/2019  . Closed fracture of right distal radius 03/11/2019  . Type III open pilon fracture of right tibia 03/11/2019  . Pounding heartbeat 01/21/2019  . Precordial chest pain 03/05/2014    Rivaan Kendall C. Christiano Blandon PT, DPT 10/07/19 4:10 PM   [facility] [address] [place], [place], [address] Phone: [phone]   Fax:  [phone]  Name: Jonathan Bass MRN: [id number] Date of Birth: [birth year]

## 2019-10-10 ENCOUNTER — Encounter: Payer: Self-pay | Admitting: Orthopedic Surgery

## 2019-10-13 ENCOUNTER — Other Ambulatory Visit: Payer: Self-pay

## 2019-10-13 ENCOUNTER — Ambulatory Visit: Payer: 59 | Admitting: Physical Therapy

## 2019-10-13 ENCOUNTER — Encounter: Payer: Self-pay | Admitting: Physical Therapy

## 2019-10-13 DIAGNOSIS — R262 Difficulty in walking, not elsewhere classified: Secondary | ICD-10-CM

## 2019-10-13 DIAGNOSIS — M25671 Stiffness of right ankle, not elsewhere classified: Secondary | ICD-10-CM | POA: Diagnosis not present

## 2019-10-13 DIAGNOSIS — R600 Localized edema: Secondary | ICD-10-CM

## 2019-10-13 NOTE — Therapy (Signed)
[facility] [address] [place], [place], [address] Phone: [phone]   Fax:  [phone]  Physical Therapy Treatment  Patient Details  Name: Jonathan Bass MRN: [id number] Date of Birth: 05/05/1961 Referring Provider (PT): Nadara Mustarduda, Marcus V, MD   Encounter Date: 10/13/2019  PT End of Session - 10/13/19 1501    Visit Number  6    Number of Visits  38    Date for PT Re-Evaluation  02/05/20    Authorization Type  UHC    PT Start Time  1415    PT Stop Time  1515    PT Time Calculation (min)  60 min    Activity Tolerance  Patient tolerated treatment well    Behavior During Therapy  [facility] for tasks assessed/performed       Past Medical History:  Diagnosis Date  . Anxiety   . GERD (gastroesophageal reflux disease)    02/21/2019- not current   . Pulmonary nodule 10/2014   Noted on CT scan -> stable and follow-up 2019.  No further evaluation necessary.    Past Surgical History:  Procedure Laterality Date  . APPLICATION OF WOUND VAC Right 06/10/2019   Procedure: Application Of Wound Vac -Prevena;  Surgeon: Nadara Mustarduda, Marcus V, MD;  [facility];  Service: Orthopedics;  Laterality: Right;  . CARDIAC EVENT MONITOR  01/2019   redominantly NSR with average rate 60 bpm.  One short 4 beat run of NSVT.  Minimal PACs and PVCs noted.  No sustained arrhythmia.  Marland Kitchen. CLOSED REDUCTION WRIST FRACTURE Right 03/11/2019   Procedure: CLOSED REDUCTION WRIST;  Surgeon: Roby LoftsHaddix, Kevin P, MD;  [facility];  Service: Orthopedics;  Laterality: Right;  . COLONOSCOPY    . EXTERNAL FIXATION LEG Right 03/11/2019   Procedure: EXTERNAL FIXATION LEG;  Surgeon: Roby LoftsHaddix, Kevin P, MD;  [facility];  Service: Orthopedics;  Laterality: Right;  . EXTERNAL FIXATION REMOVAL Right 05/08/2019   Procedure: REMOVAL EXTERNAL FIXATION ANKLE, PLACEMENT OF CAST;  Surgeon: Roby LoftsHaddix, Kevin P, MD;  [facility];  Service: Orthopedics;  Laterality: Right;  . FOOT SURGERY  Bilateral    hammer toe  . HERNIA REPAIR Bilateral 2009   Inguinial  . I&D EXTREMITY Right 03/11/2019   Procedure: IRRIGATION AND DEBRIDEMENT EXTREMITY;  Surgeon: Roby LoftsHaddix, Kevin P, MD;  [facility];  Service: Orthopedics;  Laterality: Right;  . IR FLUORO GUIDE CV LINE RIGHT  07/09/2019  . OPEN REDUCTION INTERNAL FIXATION (ORIF) TIBIA/FIBULA FRACTURE Right 06/10/2019   Procedure: EXCISION RIGHT TIBIA AND FIBULA, WOUND DEBRIDEMENT, AND FIXATION OF TIBIA AND FIBULA;  Surgeon: Nadara Mustarduda, Marcus V, MD;  [facility];  Service: Orthopedics;  Laterality: Right;  . OPEN REDUCTION INTERNAL FIXATION (ORIF) TIBIA/FIBULA FRACTURE Right 06/12/2019   Procedure: DEBRIDEMENT AND REVISION INTERNAL FIXATION INFECTED RIGHT PILON FRACTURE;  Surgeon: Nadara Mustarduda, Marcus V, MD;  [facility];  Service: Orthopedics;  Laterality: Right;  . ORIF ANKLE FRACTURE Right 03/11/2019   Procedure: OPEN REDUCTION INTERNAL FIXATION (ORIF) ANKLE FRACTURE;  Surgeon: Roby LoftsHaddix, Kevin P, MD;  [facility];  Service: Orthopedics;  Laterality: Right;  . ORIF WRIST FRACTURE Right 03/11/2019   Procedure: OPEN REDUCTION INTERNAL FIXATION (ORIF) WRIST FRACTURE;  Surgeon: Roby LoftsHaddix, Kevin P, MD;  [facility];  Service: Orthopedics;  Laterality: Right;  . TONSILLECTOMY    . TRANSTHORACIC ECHOCARDIOGRAM  01/2019   EF 55 to 60%.  Normal wall motion.  Indeterminate likely grade 1 diastolic function.  Mild degenerative mitral valve disease  with thickening but no prolapse.    There were no vitals filed for this visit.      [facility] PT Assessment - 10/13/19 0001      Assessment   Medical Diagnosis  s/p revision fusion open grade 3 pilon fracture right ankle.    Referring Provider (PT)  Nadara Mustard, MD    Onset Date/Surgical Date  06/12/19    Next MD Visit  as needed      AROM   Right Ankle Dorsiflexion  3    Right Ankle Plantar Flexion  25      Strength   Strength Assessment Site  Hip    Right/Left Hip  Right    Right Hip Flexion  4+/5     Right Hip Extension  4/5    Right Hip ABduction  4/5                   [facility] Adult PT Treatment/Exercise - 10/13/19 0001      Vasopneumatic   Number Minutes Vasopneumatic   15 minutes    Vasopnuematic Location   Ankle    Vasopneumatic Pressure  Low    Vasopneumatic Temperature   32      Manual Therapy   Manual therapy comments  ankle & great toe PROM    Joint Mobilization  talocrural distraction      Ankle Exercises: Standing   SLS  with Lt foot prop    Rocker Board  Other (comment)   Rt foot only- Lt prop on board   Side Shuffle (Round Trip)  side stepping along counter    Other Standing Ankle Exercises  step back- eccentric calf, heel does not touch floor    Other Standing Ankle Exercises  gait training without AD               PT Short Term Goals - 10/13/19 1417      PT SHORT TERM GOAL #1   Title  Pt will demo heel/toe gait pattern in tennis shoes and LRAD for short distances    Status  Achieved      PT SHORT TERM GOAL #2   Title  pt will tolerate at least 50% weight bearing through RLE (measured on scale) with good control of proximal posture    Status  Achieved      PT SHORT TERM GOAL #3   Title  pt will demo controlled AROM all 4 directions in ankle in [place]    Baseline  minimal motion but notable activation    Status  Achieved        PT Long Term Goals - 10/13/19 1419      PT LONG TERM GOAL #1   Title  pt will be able to ambulate on even and uneven surfaces without AD    Baseline  using bil axillary crutches    Status  New    Target Date  02/05/20      PT LONG TERM GOAL #2   Title  return to plyometric activities as in PLOF- basketball, running    Baseline  unable    Status  New    Target Date  02/05/20      PT LONG TERM GOAL #3   Title  able to demo single leg control on Rt LE in static and dynamic SLS    Baseline  unable    Status  New    Target Date  02/05/20      PT LONG  TERM GOAL #4   Title  gross hip and LE strength to  5/5    Baseline  see flowsheets    Status  New    Target Date  02/05/20            Plan - 10/13/19 1553    Clinical Impression Statement  Asked him to stop using crutches but he would like to use a trekking pole for added stability. Feels a "mass" on anterior ankle in DF which appears to be scar tissue. Able to step Lt foot past Rt with mild antalgic pattern.    PT Treatment/Interventions  ADLs/Self Care Home Management;Cryotherapy;Electrical Stimulation;Gait training;Functional mobility Scientist, forensic;Therapeutic activities;Therapeutic exercise;Balance training;Patient/family education;Neuromuscular re-education;Manual techniques;Passive range of motion;Taping;Vasopneumatic Device    PT Next Visit Plan  continue gait training    PT Home Exercise Plan  ice/elevate 3/day, bike, hip strengthening, isometrics, heel slides-seated, great toe extension; sidelying hip-circles, ext, clams & rev, mini squats, mini side lunges, A/P weight shift    Consulted and Agree with Plan of Care  Patient       Patient will benefit from skilled therapeutic intervention in order to improve the following deficits and impairments:  Abnormal gait, Decreased range of motion, Difficulty walking, Increased muscle spasms, Decreased activity tolerance, Pain, Improper body mechanics, Impaired flexibility, Hypomobility, Decreased scar mobility, Decreased balance, Decreased mobility, Decreased strength, Increased edema, Postural dysfunction  Visit Diagnosis: Stiffness of right ankle, not elsewhere classified - Plan: PT plan of care cert/re-cert  Localized edema - Plan: PT plan of care cert/re-cert  Difficulty in walking, not elsewhere classified - Plan: PT plan of care cert/re-cert     Problem List Patient Active Problem List   Diagnosis Date Noted  . Pilon fracture of right tibia, sequela 06/10/2019  . Displaced pilon fracture of right tibia, initial encounter for open fracture type IIIA, IIIB, or IIIC    . Subacute osteomyelitis of right tibia ([place])   . Surgery, elective   . Anxiety   . Open pilon fracture, right, type III, initial encounter 03/11/2019  . Closed fracture of right distal radius 03/11/2019  . Type III open pilon fracture of right tibia 03/11/2019  . Pounding heartbeat 01/21/2019  . Precordial chest pain 03/05/2014   Krystall Kruckenberg C. Khani Paino PT, DPT 10/13/19 4:08 PM   [facility] [address] [place], [place], [address] Phone: [phone]   Fax:  [phone]  Name: Jonathan Bass MRN: [id number] Date of Birth: [birth year]

## 2019-10-20 ENCOUNTER — Other Ambulatory Visit: Payer: Self-pay

## 2019-10-20 ENCOUNTER — Encounter: Payer: Self-pay | Admitting: Physical Therapy

## 2019-10-20 ENCOUNTER — Ambulatory Visit: Payer: 59 | Attending: Orthopedic Surgery | Admitting: Physical Therapy

## 2019-10-20 DIAGNOSIS — R600 Localized edema: Secondary | ICD-10-CM | POA: Diagnosis present

## 2019-10-20 DIAGNOSIS — R262 Difficulty in walking, not elsewhere classified: Secondary | ICD-10-CM

## 2019-10-20 DIAGNOSIS — M25671 Stiffness of right ankle, not elsewhere classified: Secondary | ICD-10-CM | POA: Diagnosis not present

## 2019-10-20 NOTE — Therapy (Signed)
[place], [place], [address] Phone: [phone]   Fax:  [phone]  Physical Therapy Treatment  Patient Details  Name: Jonathan Bass MRN: [id number] Date of Birth: Nov 08, 1961 Referring Provider (PT): Newt Minion, MD   Encounter Date: 10/20/2019  PT End of Session - 10/20/19 1549    Visit Number  7    Number of Visits  38    Date for PT Re-Evaluation  02/05/20    Authorization Type  UHC    PT Start Time  1505    PT Stop Time  2376    PT Time Calculation (min)  44 min    Activity Tolerance  Patient tolerated treatment well    Behavior During Therapy  [facility] for tasks assessed/performed       Past Medical History:  Diagnosis Date  . Anxiety   . GERD (gastroesophageal reflux disease)    02/21/2019- not current   . Pulmonary nodule 10/2014   Noted on CT scan -> stable and follow-up 2019.  No further evaluation necessary.    Past Surgical History:  Procedure Laterality Date  . APPLICATION OF WOUND VAC Right 06/10/2019   Procedure: Application Of Wound Vac -Prevena;  Surgeon: Newt Minion, MD;  Location: [place];  Service: Orthopedics;  Laterality: Right;  . CARDIAC EVENT MONITOR  01/2019   redominantly NSR with average rate 60 bpm.  One short 4 beat run of NSVT.  Minimal PACs and PVCs noted.  No sustained arrhythmia.  Marland Kitchen CLOSED REDUCTION WRIST FRACTURE Right 03/11/2019   Procedure: CLOSED REDUCTION WRIST;  Surgeon: Shona Needles, MD;  Location: [place];  Service: Orthopedics;  Laterality: Right;  . COLONOSCOPY    . EXTERNAL FIXATION LEG Right 03/11/2019   Procedure: EXTERNAL FIXATION LEG;  Surgeon: Shona Needles, MD;  Location: [place];  Service: Orthopedics;  Laterality: Right;  . EXTERNAL FIXATION REMOVAL Right 05/08/2019   Procedure: REMOVAL EXTERNAL FIXATION ANKLE, PLACEMENT OF CAST;  Surgeon: Shona Needles, MD;  Location: [place];  Service: Orthopedics;  Laterality: Right;  . FOOT SURGERY Bilateral     hammer toe  . HERNIA REPAIR Bilateral 2009   Inguinial  . I&D EXTREMITY Right 03/11/2019   Procedure: IRRIGATION AND DEBRIDEMENT EXTREMITY;  Surgeon: Shona Needles, MD;  Location: [place];  Service: Orthopedics;  Laterality: Right;  . IR FLUORO GUIDE CV LINE RIGHT  07/09/2019  . OPEN REDUCTION INTERNAL FIXATION (ORIF) TIBIA/FIBULA FRACTURE Right 06/10/2019   Procedure: EXCISION RIGHT TIBIA AND FIBULA, WOUND DEBRIDEMENT, AND FIXATION OF TIBIA AND FIBULA;  Surgeon: Newt Minion, MD;  Location: [place];  Service: Orthopedics;  Laterality: Right;  . OPEN REDUCTION INTERNAL FIXATION (ORIF) TIBIA/FIBULA FRACTURE Right 06/12/2019   Procedure: DEBRIDEMENT AND REVISION INTERNAL FIXATION INFECTED RIGHT PILON FRACTURE;  Surgeon: Newt Minion, MD;  Location: [place];  Service: Orthopedics;  Laterality: Right;  . ORIF ANKLE FRACTURE Right 03/11/2019   Procedure: OPEN REDUCTION INTERNAL FIXATION (ORIF) ANKLE FRACTURE;  Surgeon: Shona Needles, MD;  Location: [place];  Service: Orthopedics;  Laterality: Right;  . ORIF WRIST FRACTURE Right 03/11/2019   Procedure: OPEN REDUCTION INTERNAL FIXATION (ORIF) WRIST FRACTURE;  Surgeon: Shona Needles, MD;  Location: [place];  Service: Orthopedics;  Laterality: Right;  . TONSILLECTOMY    . TRANSTHORACIC ECHOCARDIOGRAM  01/2019   EF 55 to 60%.  Normal wall motion.  Indeterminate likely grade 1 diastolic function.  Mild degenerative mitral valve disease  with thickening but no prolapse.    There were no vitals filed for this visit.  Subjective Assessment - 10/20/19 1508    Subjective  Good and bad depending on how swollen my ankle is.                       [facility] Adult PT Treatment/Exercise - 10/20/19 0001      Manual Therapy   Edema Management  medial ankle    Joint Mobilization  distraction, AP at end range DF      Ankle Exercises: Seated   BAPS  Level 4;Sitting      Ankle Exercises: Standing   SLS  on Rt with Lt foot abd & extension swings     Other Standing Ankle Exercises  small side lunge with push away    Other Standing Ankle Exercises  gait training on treadmil- Lt hip pop during Rt Le ext      Ankle Exercises: Stretches   Other Stretch  hip flexor stretch supine with strap             PT Education - 10/20/19 1627    Education Details  anatomy of condition, encouraged him that he is progressing    Person(s) Educated  Patient    Methods  Explanation    Comprehension  Verbalized understanding       PT Short Term Goals - 10/13/19 1417      PT SHORT TERM GOAL #1   Title  Pt will demo heel/toe gait pattern in tennis shoes and LRAD for short distances    Status  Achieved      PT SHORT TERM GOAL #2   Title  pt will tolerate at least 50% weight bearing through RLE (measured on scale) with good control of proximal posture    Status  Achieved      PT SHORT TERM GOAL #3   Title  pt will demo controlled AROM all 4 directions in ankle in [place]    Baseline  minimal motion but notable activation    Status  Achieved        PT Long Term Goals - 10/13/19 1419      PT LONG TERM GOAL #1   Title  pt will be able to ambulate on even and uneven surfaces without AD    Baseline  using bil axillary crutches    Status  New    Target Date  02/05/20      PT LONG TERM GOAL #2   Title  return to plyometric activities as in PLOF- basketball, running    Baseline  unable    Status  New    Target Date  02/05/20      PT LONG TERM GOAL #3   Title  able to demo single leg control on Rt LE in static and dynamic SLS    Baseline  unable    Status  New    Target Date  02/05/20      PT LONG TERM GOAL #4   Title  gross hip and LE strength to 5/5    Baseline  see flowsheets    Status  New    Target Date  02/05/20            Plan - 10/20/19 1556    Clinical Impression Statement  Ambulating with single trekking pole with good heel strike and swing through, but noted Lt hip lateral obliquity concurrent with Rt hip  extension-resolved at  toe off. Rt hip abd 4+/5 vs Lt 5/5. Anterior pain at site of previous infection. Spoke with Dr Lajoyce Corners who encouraged manual mobilization to all joints even with decreased joint space, believes tibia will decrease DF availability.    PT Treatment/Interventions  ADLs/Self Care Home Management;Cryotherapy;Electrical Stimulation;Gait training;Functional mobility Network engineer;Therapeutic activities;Therapeutic exercise;Balance training;Patient/family education;Neuromuscular re-education;Manual techniques;Passive range of motion;Taping;Vasopneumatic Device    PT Next Visit Plan  hip abd strength, distraction & manual mobilty    PT Home Exercise Plan  ice/elevate 3/day, bike, hip strengthening, isometrics, heel slides-seated, great toe extension; sidelying hip-circles, ext, clams & rev, mini squats, mini side lunges, A/P weight shift    Consulted and Agree with Plan of Care  Patient       Patient will benefit from skilled therapeutic intervention in order to improve the following deficits and impairments:  Abnormal gait, Decreased range of motion, Difficulty walking, Increased muscle spasms, Decreased activity tolerance, Pain, Improper body mechanics, Impaired flexibility, Hypomobility, Decreased scar mobility, Decreased balance, Decreased mobility, Decreased strength, Increased edema, Postural dysfunction  Visit Diagnosis: Stiffness of right ankle, not elsewhere classified  Localized edema  Difficulty in walking, not elsewhere classified     Problem List Patient Active Problem List   Diagnosis Date Noted  . Pilon fracture of right tibia, sequela 06/10/2019  . Displaced pilon fracture of right tibia, initial encounter for open fracture type IIIA, IIIB, or IIIC   . Subacute osteomyelitis of right tibia ([facility])   . Surgery, elective   . Anxiety   . Open pilon fracture, right, type III, initial encounter 03/11/2019  . Closed fracture of right distal radius 03/11/2019   . Type III open pilon fracture of right tibia 03/11/2019  . Pounding heartbeat 01/21/2019  . Precordial chest pain 03/05/2014   Kerith Sherley C. Kartier Bennison PT, DPT 10/20/19 4:28 PM   [facility] [address] [place], [place], [address] Phone: [phone]   Fax:  [phone]  Name: LAIKEN NOHR MRN: [id number] Date of Birth: 1961-06-13

## 2019-10-27 ENCOUNTER — Ambulatory Visit: Payer: 59 | Admitting: Physical Therapy

## 2019-11-03 ENCOUNTER — Other Ambulatory Visit: Payer: Self-pay

## 2019-11-03 ENCOUNTER — Encounter: Payer: Self-pay | Admitting: Physical Therapy

## 2019-11-03 ENCOUNTER — Ambulatory Visit: Payer: 59 | Admitting: Physical Therapy

## 2019-11-03 DIAGNOSIS — M25671 Stiffness of right ankle, not elsewhere classified: Secondary | ICD-10-CM

## 2019-11-03 DIAGNOSIS — R262 Difficulty in walking, not elsewhere classified: Secondary | ICD-10-CM

## 2019-11-03 DIAGNOSIS — R600 Localized edema: Secondary | ICD-10-CM

## 2019-11-04 ENCOUNTER — Encounter: Payer: Self-pay | Admitting: Physical Therapy

## 2019-11-04 NOTE — Therapy (Signed)
[facility] [address] [place], [place], [address] Phone: [phone]   Fax:  [phone]  Physical Therapy Treatment  Patient Details  Name: Jonathan Bass MRN: [id number] Date of Birth: [birth year] Referring Provider (PT): Nadara Mustard, MD   Encounter Date: 11/03/2019  PT End of Session - 11/04/19 0757    Visit Number  8    Number of Visits  38    Date for PT Re-Evaluation  02/05/20    Authorization Type  [employer]    PT Start Time  1427    PT Stop Time  1500    PT Time Calculation (min)  33 min    Activity Tolerance  Patient tolerated treatment well    Behavior During Therapy  [facility] for tasks assessed/performed       Past Medical History:  Diagnosis Date  . Anxiety   . GERD (gastroesophageal reflux disease)    02/21/2019- not current   . Pulmonary nodule 10/2014   Noted on CT scan -> stable and follow-up 2019.  No further evaluation necessary.    Past Surgical History:  Procedure Laterality Date  . APPLICATION OF WOUND VAC Right 06/10/2019   Procedure: Application Of Wound Vac -Prevena;  Surgeon: Nadara Mustard, MD;  Location: [facility] OR;  Service: Orthopedics;  Laterality: Right;  . CARDIAC EVENT MONITOR  01/2019   redominantly NSR with average rate 60 bpm.  One short 4 beat run of NSVT.  Minimal PACs and PVCs noted.  No sustained arrhythmia.  Marland Kitchen CLOSED REDUCTION WRIST FRACTURE Right 03/11/2019   Procedure: CLOSED REDUCTION WRIST;  Surgeon: [name], MD;  Location: [facility] OR;  Service: Orthopedics;  Laterality: Right;  . COLONOSCOPY    . EXTERNAL FIXATION LEG Right 03/11/2019   Procedure: EXTERNAL FIXATION LEG;  Surgeon: [name], MD;  Location: [facility] OR;  Service: Orthopedics;  Laterality: Right;  . EXTERNAL FIXATION REMOVAL Right 05/08/2019   Procedure: REMOVAL EXTERNAL FIXATION ANKLE, PLACEMENT OF CAST;  Surgeon: [name], MD;  Location: [facility] OR;  Service: Orthopedics;  Laterality: Right;  . FOOT SURGERY  Bilateral    hammer toe  . HERNIA REPAIR Bilateral 2009   Inguinial  . I&D EXTREMITY Right 03/11/2019   Procedure: IRRIGATION AND DEBRIDEMENT EXTREMITY;  Surgeon: [name], MD;  Location: [facility] OR;  Service: Orthopedics;  Laterality: Right;  . IR FLUORO GUIDE CV LINE RIGHT  07/09/2019  . OPEN REDUCTION INTERNAL FIXATION (ORIF) TIBIA/FIBULA FRACTURE Right 06/10/2019   Procedure: EXCISION RIGHT TIBIA AND FIBULA, WOUND DEBRIDEMENT, AND FIXATION OF TIBIA AND FIBULA;  Surgeon: Nadara Mustard, MD;  Location: [facility] OR;  Service: Orthopedics;  Laterality: Right;  . OPEN REDUCTION INTERNAL FIXATION (ORIF) TIBIA/FIBULA FRACTURE Right 06/12/2019   Procedure: DEBRIDEMENT AND REVISION INTERNAL FIXATION INFECTED RIGHT PILON FRACTURE;  Surgeon: Nadara Mustard, MD;  Location: [facility] OR;  Service: Orthopedics;  Laterality: Right;  . ORIF ANKLE FRACTURE Right 03/11/2019   Procedure: OPEN REDUCTION INTERNAL FIXATION (ORIF) ANKLE FRACTURE;  Surgeon: [name], MD;  [facility]: [facility] OR;  Service: Orthopedics;  Laterality: Right;  . ORIF WRIST FRACTURE Right 03/11/2019   Procedure: OPEN REDUCTION INTERNAL FIXATION (ORIF) WRIST FRACTURE;  Surgeon: [name], MD;  Location: [facility] OR;  Service: Orthopedics;  Laterality: Right;  . TONSILLECTOMY    . TRANSTHORACIC ECHOCARDIOGRAM  01/2019   EF 55 to 60%.  Normal wall motion.  Indeterminate likely grade 1 diastolic function.  Mild degenerative mitral valve disease  with thickening but no prolapse.    There were no vitals filed for this visit.  Subjective Assessment - 11/04/19 0754    Subjective  Patient reports it has felt pretty good. He has been working on is stretches and exercises at home.    Limitations  Standing    Currently in Pain?  No/denies                       [facility] Adult PT Treatment/Exercise - 11/04/19 0001      Manual Therapy   Edema Management  medial ankle    Joint Mobilization  distraction, AP at end range DF    Soft tissue  mobilization  trigger point releasdse to the calf.       Ankle Exercises: Standing   SLS  on Rt with Lt foot abd & extension swings 2x10 each     Other Standing Ankle Exercises  small side lunge with push away x 15 each; slow march x15              PT Education - 11/04/19 0755    Education Details  reviewed HEP and ther-ex    Person(s) Educated  Patient    Methods  Explanation;Demonstration;Tactile cues;Verbal cues    Comprehension  Verbalized understanding;Returned demonstration;Verbal cues required;Tactile cues required       PT Short Term Goals - 10/13/19 1417      PT SHORT TERM GOAL #1   Title  Pt will demo heel/toe gait pattern in tennis shoes and LRAD for short distances    Status  Achieved      PT SHORT TERM GOAL #2   Title  pt will tolerate at least 50% weight bearing through RLE (measured on scale) with good control of proximal posture    Status  Achieved      PT SHORT TERM GOAL #3   Title  pt will demo controlled AROM all 4 directions in ankle in OKC    Baseline  minimal motion but notable activation    Status  Achieved        PT Long Term Goals - 10/13/19 1419      PT LONG TERM GOAL #1   Title  pt will be able to ambulate on even and uneven surfaces without AD    Baseline  using bil axillary crutches    Status  New    Target Date  02/05/20      PT LONG TERM GOAL #2   Title  return to plyometric activities as in PLOF- basketball, running    Baseline  unable    Status  New    Target Date  02/05/20      PT LONG TERM GOAL #3   Title  able to demo single leg control on Rt LE in static and dynamic SLS    Baseline  unable    Status  New    Target Date  02/05/20      PT LONG TERM GOAL #4   Title  gross hip and LE strength to 5/5    Baseline  see flowsheets    Status  New    Target Date  02/05/20            Plan - 11/04/19 0834    Clinical Impression Statement  Patient had a limited treatmnet 2nd to time. His ROM appears to be progressing  well. He was given light resitance for his ROM exercises. He reported it felt  really good and will perfrom at home. He tolerated standing exercises well. therapy will continue to progress as tolerated.    Personal Factors and Comorbidities  Comorbidity 1    Comorbidities  infection causing delayed healing    Examination-Activity Limitations  Bathing;Locomotion Level;Transfers;Sit;Sleep;Squat;Dressing;Stairs;Stand;Lift;Hygiene/Grooming    Examination-Participation Restrictions  Meal Prep;Cleaning;Community Activity;Driving    Stability/Clinical Decision Making  Unstable/Unpredictable    Clinical Decision Making  Moderate    Rehab Potential  Good    PT Treatment/Interventions  ADLs/Self Care Home Management;Cryotherapy;Electrical Stimulation;Gait training;Functional mobility Network engineertraining;Stair training;Therapeutic activities;Therapeutic exercise;Balance training;Patient/family education;Neuromuscular re-education;Manual techniques;Passive range of motion;Taping;Vasopneumatic Device    PT Next Visit Plan  hip abd strength, distraction & manual mobilty    PT Home Exercise Plan  ice/elevate 3/day, bike, hip strengthening, isometrics, heel slides-seated, great toe extension; sidelying hip-circles, ext, clams & rev, mini squats, mini side lunges, A/P weight shift    Consulted and Agree with Plan of Care  Patient       Patient will benefit from skilled therapeutic intervention in order to improve the following deficits and impairments:  Abnormal gait, Decreased range of motion, Difficulty walking, Increased muscle spasms, Decreased activity tolerance, Pain, Improper body mechanics, Impaired flexibility, Hypomobility, Decreased scar mobility, Decreased balance, Decreased mobility, Decreased strength, Increased edema, Postural dysfunction  Visit Diagnosis: Stiffness of right ankle, not elsewhere classified  Localized edema  Difficulty in walking, not elsewhere classified     Problem List Patient Active  Problem List   Diagnosis Date Noted  . Pilon fracture of right tibia, sequela 06/10/2019  . Displaced pilon fracture of right tibia, initial encounter for open fracture type IIIA, IIIB, or IIIC   . Subacute osteomyelitis of right tibia (HCC)   . Surgery, elective   . Anxiety   . Open pilon fracture, right, type III, initial encounter 03/11/2019  . Closed fracture of right distal radius 03/11/2019  . Type III open pilon fracture of right tibia 03/11/2019  . Pounding heartbeat 01/21/2019  . Precordial chest pain 03/05/2014    Jonathan Bass 11/04/2019, 8:42 AM  [facility] [address] [place], [place], [address] Phone: [phone]   Fax:  [phone]  Name: Jonathan Bass MRN: [id number] Date of Birth: 01/03/1961

## 2019-11-10 ENCOUNTER — Other Ambulatory Visit: Payer: Self-pay

## 2019-11-10 ENCOUNTER — Encounter: Payer: Self-pay | Admitting: Physical Therapy

## 2019-11-10 ENCOUNTER — Ambulatory Visit: Payer: 59 | Admitting: Physical Therapy

## 2019-11-10 DIAGNOSIS — M25671 Stiffness of right ankle, not elsewhere classified: Secondary | ICD-10-CM | POA: Diagnosis not present

## 2019-11-10 DIAGNOSIS — R262 Difficulty in walking, not elsewhere classified: Secondary | ICD-10-CM

## 2019-11-10 DIAGNOSIS — R600 Localized edema: Secondary | ICD-10-CM

## 2019-11-10 NOTE — Therapy (Signed)
[facility] Rehabilitation [facility] [address] [place], [place], [address] Phone: [phone]   Fax:  [phone]  Physical Therapy Treatment  Patient Details  Name: Jonathan Bass MRN: [id number] Date of Birth: 02-Nov-1961 Referring Provider (PT): Nadara Mustard, MD   Encounter Date: 11/10/2019  PT End of Session - 11/10/19 1547    Visit Number  9    Number of Visits  38    Date for PT Re-Evaluation  02/05/20    Authorization Type  UHC    PT Start Time  1538    PT Stop Time  1622    PT Time Calculation (min)  44 min    Activity Tolerance  Patient tolerated treatment well    Behavior During Therapy  [facility] for tasks assessed/performed       Past Medical History:  Diagnosis Date  . Anxiety   . GERD (gastroesophageal reflux disease)    02/21/2019- not current   . Pulmonary nodule 10/2014   Noted on CT scan -> stable and follow-up 2019.  No further evaluation necessary.    Past Surgical History:  Procedure Laterality Date  . APPLICATION OF WOUND VAC Right 06/10/2019   Procedure: Application Of Wound Vac -Prevena;  Surgeon: Nadara Mustard, MD;  Location: [facility] OR;  Service: Orthopedics;  Laterality: Right;  . CARDIAC EVENT MONITOR  01/2019   redominantly NSR with average rate 60 bpm.  One short 4 beat run of NSVT.  Minimal PACs and PVCs noted.  No sustained arrhythmia.  Marland Kitchen CLOSED REDUCTION WRIST FRACTURE Right 03/11/2019   Procedure: CLOSED REDUCTION WRIST;  Surgeon: [name], MD;  [facility] OR;  Service: Orthopedics;  Laterality: Right;  . COLONOSCOPY    . EXTERNAL FIXATION LEG Right 03/11/2019   Procedure: EXTERNAL FIXATION LEG;  Surgeon: [name], MD;  Location: [facility] OR;  Service: Orthopedics;  Laterality: Right;  . EXTERNAL FIXATION REMOVAL Right 05/08/2019   Procedure: REMOVAL EXTERNAL FIXATION ANKLE, PLACEMENT OF CAST;  Surgeon: [name], MD;  Location: [facility] OR;  Service: Orthopedics;  Laterality: Right;  . FOOT SURGERY  Bilateral    hammer toe  . HERNIA REPAIR Bilateral 2009   Inguinial  . I&D EXTREMITY Right 03/11/2019   Procedure: IRRIGATION AND DEBRIDEMENT EXTREMITY;  Surgeon: [name], MD;  Location: [facility] OR;  Service: Orthopedics;  Laterality: Right;  . IR FLUORO GUIDE CV LINE RIGHT  07/09/2019  . OPEN REDUCTION INTERNAL FIXATION (ORIF) TIBIA/FIBULA FRACTURE Right 06/10/2019   Procedure: EXCISION RIGHT TIBIA AND FIBULA, WOUND DEBRIDEMENT, AND FIXATION OF TIBIA AND FIBULA;  Surgeon: Nadara Mustard, MD;  Location: [facility] OR;  Service: Orthopedics;  Laterality: Right;  . OPEN REDUCTION INTERNAL FIXATION (ORIF) TIBIA/FIBULA FRACTURE Right 06/12/2019   Procedure: DEBRIDEMENT AND REVISION INTERNAL FIXATION INFECTED RIGHT PILON FRACTURE;  Surgeon: Nadara Mustard, MD;  Location: [facility] OR;  Service: Orthopedics;  Laterality: Right;  . ORIF ANKLE FRACTURE Right 03/11/2019   Procedure: OPEN REDUCTION INTERNAL FIXATION (ORIF) ANKLE FRACTURE;  Surgeon: [name], MD;  Location: [facility] OR;  Service: Orthopedics;  Laterality: Right;  . ORIF WRIST FRACTURE Right 03/11/2019   Procedure: OPEN REDUCTION INTERNAL FIXATION (ORIF) WRIST FRACTURE;  Surgeon: [name], MD;  Location: [facility] OR;  Service: Orthopedics;  Laterality: Right;  . TONSILLECTOMY    . TRANSTHORACIC ECHOCARDIOGRAM  01/2019   EF 55 to 60%.  Normal wall motion.  Indeterminate likely grade 1 diastolic function.  Mild degenerative mitral valve disease  with thickening but no prolapse.    There were no vitals filed for this visit.  Subjective Assessment - 11/10/19 1538    Subjective  Swelling is still the problem, only really feel the pinching when it's swollen. Work from 8-2, workout, shower and then elevate/ice. I walked all day on Saturday without using anything.         [facility] PT Assessment - 11/10/19 0001      Palpation   Palpation comment  Rt 91.5cm, 93.5cm                   [facility] Adult PT Treatment/Exercise - 11/10/19 0001      Manual  Therapy   Joint Mobilization  distraction, great toe distraction &ROM      Ankle Exercises: Standing   SLS  on airex    Other Standing Ankle Exercises  TRX- squats & lunges    Other Standing Ankle Exercises  heel raises on airex      Ankle Exercises: Supine   Isometrics  eversion, inversion    Other Supine Ankle Exercises  prone plantarflexion black tband      Ankle Exercises: Seated   BAPS  Level 2;Sitting   2lb weight   Other Seated Ankle Exercises  PF AROM + toe extension             PT Education - 11/10/19 1630    Education Details  LLD, anatomy of condition    Person(s) Educated  Patient    Methods  Explanation;Demonstration;Tactile cues;Verbal cues    Comprehension  Verbalized understanding;Returned demonstration;Verbal cues required;Tactile cues required;Need further instruction       PT Short Term Goals - 10/13/19 1417      PT SHORT TERM GOAL #1   Title  Pt will demo heel/toe gait pattern in tennis shoes and LRAD for short distances    Status  Achieved      PT SHORT TERM GOAL #2   Title  pt will tolerate at least 50% weight bearing through RLE (measured on scale) with good control of proximal posture    Status  Achieved      PT SHORT TERM GOAL #3   Title  pt will demo controlled AROM all 4 directions in ankle in OKC    Baseline  minimal motion but notable activation    Status  Achieved        PT Long Term Goals - 10/13/19 1419      PT LONG TERM GOAL #1   Title  pt will be able to ambulate on even and uneven surfaces without AD    Baseline  using bil axillary crutches    Status  New    Target Date  02/05/20      PT LONG TERM GOAL #2   Title  return to plyometric activities as in PLOF- basketball, running    Baseline  unable    Status  New    Target Date  02/05/20      PT LONG TERM GOAL #3   Title  able to demo single leg control on Rt LE in static and dynamic SLS    Baseline  unable    Status  New    Target Date  02/05/20      PT LONG TERM  GOAL #4   Title  gross hip and LE strength to 5/5    Baseline  see flowsheets    Status  New    Target Date  02/05/20  Plan - 11/10/19 1627    Clinical Impression Statement  Progressed into use of TrX for posterior transfer of weight. he continues to have most complaints at anterior shin as tibia rounds posteriorly in distal half resulting in majority of weight placing through anterior aspect of tibia.    PT Treatment/Interventions  ADLs/Self Care Home Management;Cryotherapy;Electrical Stimulation;Gait training;Functional mobility Network engineertraining;Stair training;Therapeutic activities;Therapeutic exercise;Balance training;Patient/family education;Neuromuscular re-education;Manual techniques;Passive range of motion;Taping;Vasopneumatic Device    PT Next Visit Plan  10th visit PN, continue with standing challenges as able    PT Home Exercise Plan  ice/elevate 3/day, bike, hip strengthening, isometrics, heel slides-seated, great toe extension; sidelying hip-circles, ext, clams & rev, mini squats, mini side lunges, A/P weight shift    Consulted and Agree with Plan of Care  Patient       Patient will benefit from skilled therapeutic intervention in order to improve the following deficits and impairments:  Abnormal gait, Decreased range of motion, Difficulty walking, Increased muscle spasms, Decreased activity tolerance, Pain, Improper body mechanics, Impaired flexibility, Hypomobility, Decreased scar mobility, Decreased balance, Decreased mobility, Decreased strength, Increased edema, Postural dysfunction  Visit Diagnosis: Stiffness of right ankle, not elsewhere classified  Localized edema  Difficulty in walking, not elsewhere classified     Problem List Patient Active Problem List   Diagnosis Date Noted  . Pilon fracture of right tibia, sequela 06/10/2019  . Displaced pilon fracture of right tibia, initial encounter for open fracture type IIIA, IIIB, or IIIC   . Subacute  osteomyelitis of right tibia (HCC)   . Surgery, elective   . Anxiety   . Open pilon fracture, right, type III, initial encounter 03/11/2019  . Closed fracture of right distal radius 03/11/2019  . Type III open pilon fracture of right tibia 03/11/2019  . Pounding heartbeat 01/21/2019  . Precordial chest pain 03/05/2014    Sharmel Ballantine C. Stashia Sia PT, DPT 11/10/19 4:31 PM   [facility] [address] [place], [place], [address] Phone: [phone]   Fax:  [phone]  Name: Jonathan Bass MRN: [id number] Date of Birth: [birth year]

## 2019-11-17 ENCOUNTER — Encounter: Payer: Self-pay | Admitting: Physical Therapy

## 2019-11-17 ENCOUNTER — Other Ambulatory Visit: Payer: Self-pay

## 2019-11-17 ENCOUNTER — Ambulatory Visit: Payer: 59 | Attending: Orthopedic Surgery | Admitting: Physical Therapy

## 2019-11-17 DIAGNOSIS — R600 Localized edema: Secondary | ICD-10-CM | POA: Insufficient documentation

## 2019-11-17 DIAGNOSIS — M25671 Stiffness of right ankle, not elsewhere classified: Secondary | ICD-10-CM | POA: Insufficient documentation

## 2019-11-17 DIAGNOSIS — R262 Difficulty in walking, not elsewhere classified: Secondary | ICD-10-CM | POA: Insufficient documentation

## 2019-11-17 NOTE — Therapy (Addendum)
[place], [place], [address] Phone: [phone]   Fax:  [phone]  Physical Therapy Treatment/Discharge  Patient Details  Name: Jonathan Bass MRN: [id number] Date of Birth: February 20, 1961 Referring Provider (PT): Newt Minion, MD   Encounter Date: 11/17/2019  PT End of Session - 11/17/19 1512    Visit Number  10    Number of Visits  38    Date for PT Re-Evaluation  02/05/20    Authorization Type  UHC    PT Start Time  1508    PT Stop Time  1559    PT Time Calculation (min)  51 min    Activity Tolerance  Patient tolerated treatment well    Behavior During Therapy  [facility] for tasks assessed/performed       Past Medical History:  Diagnosis Date  . Anxiety   . GERD (gastroesophageal reflux disease)    02/21/2019- not current   . Pulmonary nodule 10/2014   Noted on CT scan -> stable and follow-up 2019.  No further evaluation necessary.    Past Surgical History:  Procedure Laterality Date  . APPLICATION OF WOUND VAC Right 06/10/2019   Procedure: Application Of Wound Vac -Prevena;  Surgeon: Newt Minion, MD;  Location: [place];  Service: Orthopedics;  Laterality: Right;  . CARDIAC EVENT MONITOR  01/2019   redominantly NSR with average rate 60 bpm.  One short 4 beat run of NSVT.  Minimal PACs and PVCs noted.  No sustained arrhythmia.  Marland Kitchen CLOSED REDUCTION WRIST FRACTURE Right 03/11/2019   Procedure: CLOSED REDUCTION WRIST;  Surgeon: Shona Needles, MD;  Location: [place];  Service: Orthopedics;  Laterality: Right;  . COLONOSCOPY    . EXTERNAL FIXATION LEG Right 03/11/2019   Procedure: EXTERNAL FIXATION LEG;  Surgeon: Shona Needles, MD;  Location: [place];  Service: Orthopedics;  Laterality: Right;  . EXTERNAL FIXATION REMOVAL Right 05/08/2019   Procedure: REMOVAL EXTERNAL FIXATION ANKLE, PLACEMENT OF CAST;  Surgeon: Shona Needles, MD;  Location: [place];  Service: Orthopedics;  Laterality: Right;  . FOOT  SURGERY Bilateral    hammer toe  . HERNIA REPAIR Bilateral 2009   Inguinial  . I&D EXTREMITY Right 03/11/2019   Procedure: IRRIGATION AND DEBRIDEMENT EXTREMITY;  Surgeon: Shona Needles, MD;  Location: [place];  Service: Orthopedics;  Laterality: Right;  . IR FLUORO GUIDE CV LINE RIGHT  07/09/2019  . OPEN REDUCTION INTERNAL FIXATION (ORIF) TIBIA/FIBULA FRACTURE Right 06/10/2019   Procedure: EXCISION RIGHT TIBIA AND FIBULA, WOUND DEBRIDEMENT, AND FIXATION OF TIBIA AND FIBULA;  Surgeon: Newt Minion, MD;  Location: [place];  Service: Orthopedics;  Laterality: Right;  . OPEN REDUCTION INTERNAL FIXATION (ORIF) TIBIA/FIBULA FRACTURE Right 06/12/2019   Procedure: DEBRIDEMENT AND REVISION INTERNAL FIXATION INFECTED RIGHT PILON FRACTURE;  Surgeon: Newt Minion, MD;  Location: [place];  Service: Orthopedics;  Laterality: Right;  . ORIF ANKLE FRACTURE Right 03/11/2019   Procedure: OPEN REDUCTION INTERNAL FIXATION (ORIF) ANKLE FRACTURE;  Surgeon: Shona Needles, MD;  Location: [place];  Service: Orthopedics;  Laterality: Right;  . ORIF WRIST FRACTURE Right 03/11/2019   Procedure: OPEN REDUCTION INTERNAL FIXATION (ORIF) WRIST FRACTURE;  Surgeon: Shona Needles, MD;  Location: [place];  Service: Orthopedics;  Laterality: Right;  . TONSILLECTOMY    . TRANSTHORACIC ECHOCARDIOGRAM  01/2019   EF 55 to 60%.  Normal wall motion.  Indeterminate likely grade 1 diastolic function.  Mild degenerative mitral valve disease  with thickening but no prolapse.    There were no vitals filed for this visit.  Subjective Assessment - 11/17/19 1511    Subjective  Fine after TrX. Only place it still hurts is anterior ankle. Occasional throbbing/pulsing in that area, if I rub it for a few min I can walk pain free for a little bit.                       [place] Adult PT Treatment/Exercise - 11/17/19 0001      Vasopneumatic   Number Minutes Vasopneumatic   15 minutes    Vasopnuematic Location   Ankle    Vasopneumatic  Pressure  Low    Vasopneumatic Temperature   32      Manual Therapy   Joint Mobilization  distraction, Gr 4 AP talocrural with PA pressure on distal tib-fib      Ankle Exercises: Aerobic   Elliptical  5 min L1 ramp 4      Ankle Exercises: Standing   Other Standing Ankle Exercises  TRX: squats neutral & t/o, plank out on toes               PT Short Term Goals - 10/13/19 1417      PT SHORT TERM GOAL #1   Title  Pt will demo heel/toe gait pattern in tennis shoes and LRAD for short distances    Status  Achieved      PT SHORT TERM GOAL #2   Title  pt will tolerate at least 50% weight bearing through RLE (measured on scale) with good control of proximal posture    Status  Achieved      PT SHORT TERM GOAL #3   Title  pt will demo controlled AROM all 4 directions in ankle in OKC    Baseline  minimal motion but notable activation    Status  Achieved        PT Long Term Goals - 10/13/19 1419      PT LONG TERM GOAL #1   Title  pt will be able to ambulate on even and uneven surfaces without AD    Baseline  using bil axillary crutches    Status  New    Target Date  02/05/20      PT LONG TERM GOAL #2   Title  return to plyometric activities as in PLOF- basketball, running    Baseline  unable    Status  New    Target Date  02/05/20      PT LONG TERM GOAL #3   Title  able to demo single leg control on Rt LE in static and dynamic SLS    Baseline  unable    Status  New    Target Date  02/05/20      PT LONG TERM GOAL #4   Title  gross hip and LE strength to 5/5    Baseline  see flowsheets    Status  New    Target Date  02/05/20            Plan - 11/17/19 1725    Clinical Impression Statement  pt was discouraged today and we discussed progress that he has made and how we plan to progress forward. Continued with TrX as he was able to strengthen without increase in tibia pain. Did well with eliptical and will begin to incoorporate this into his home exercise.     PT Treatment/Interventions  ADLs/Self Care Home  Management;Cryotherapy;Electrical Stimulation;Gait training;Functional mobility Scientist, forensic;Therapeutic activities;Therapeutic exercise;Balance training;Patient/family education;Neuromuscular re-education;Manual techniques;Passive range of motion;Taping;Vasopneumatic Device    PT Next Visit Plan  how was eliptical at home? core challenges    PT Home Exercise Plan  ice/elevate 3/day, bike, hip strengthening, isometrics, heel slides-seated, great toe extension; sidelying hip-circles, ext, clams & rev, mini squats, mini side lunges, A/P weight shift    Consulted and Agree with Plan of Care  Patient       Patient will benefit from skilled therapeutic intervention in order to improve the following deficits and impairments:  Abnormal gait, Decreased range of motion, Difficulty walking, Increased muscle spasms, Decreased activity tolerance, Pain, Improper body mechanics, Impaired flexibility, Hypomobility, Decreased scar mobility, Decreased balance, Decreased mobility, Decreased strength, Increased edema, Postural dysfunction  Visit Diagnosis: Stiffness of right ankle, not elsewhere classified  Localized edema  Difficulty in walking, not elsewhere classified     Problem List Patient Active Problem List   Diagnosis Date Noted  . Pilon fracture of right tibia, sequela 06/10/2019  . Displaced pilon fracture of right tibia, initial encounter for open fracture type IIIA, IIIB, or IIIC   . Subacute osteomyelitis of right tibia ([place])   . Surgery, elective   . Anxiety   . Open pilon fracture, right, type III, initial encounter 03/11/2019  . Closed fracture of right distal radius 03/11/2019  . Type III open pilon fracture of right tibia 03/11/2019  . Pounding heartbeat 01/21/2019  . Precordial chest pain 03/05/2014   Lamiya Naas C. Kaydon Creedon PT, DPT 11/17/19 5:28 PM   [facility] [address] [place], [place], [address] Phone: [phone]   Fax:  [phone]  Name: Jonathan Bass MRN: [id number] Date of Birth: 02-05-1961  PHYSICAL THERAPY DISCHARGE SUMMARY  Visits from Start of Care: 10  Current functional level related to goals / functional outcomes: See above   Remaining deficits: See above   Education / Equipment: Anatomy of condition, POC, HEP, exercise form/rationale  Plan: Patient agrees to discharge.  Patient goals were not met. Patient is being discharged due to a change in medical status.  ?????     Ilissa Rosner C. Saliha Salts PT, DPT 01/26/20 9:54 AM

## 2019-11-23 ENCOUNTER — Encounter: Payer: Self-pay | Admitting: Orthopedic Surgery

## 2019-11-24 ENCOUNTER — Ambulatory Visit: Payer: 59 | Admitting: Physical Therapy

## 2019-11-30 ENCOUNTER — Encounter: Payer: Self-pay | Admitting: Orthopedic Surgery

## 2019-11-30 ENCOUNTER — Ambulatory Visit (INDEPENDENT_AMBULATORY_CARE_PROVIDER_SITE_OTHER): Payer: 59 | Admitting: Orthopedic Surgery

## 2019-11-30 ENCOUNTER — Other Ambulatory Visit: Payer: Self-pay

## 2019-11-30 ENCOUNTER — Ambulatory Visit: Payer: Self-pay

## 2019-11-30 VITALS — Ht 71.0 in | Wt 203.0 lb

## 2019-11-30 DIAGNOSIS — M25571 Pain in right ankle and joints of right foot: Secondary | ICD-10-CM

## 2019-11-30 NOTE — Progress Notes (Signed)
Office Visit Note   Patient: Jonathan Bass           Date of Birth: 09/21/1961           MRN: [id number] Visit Date: 11/30/2019              Requested by: Juluis Rainier, MD [address] [place],  [place] [address] PCP: Juluis Rainier, MD  Chief Complaint  Patient presents with  . Right Ankle - Follow-up    06/12/2019 Right s/p pilon fx      HPI: Patient is a 58 year old gentleman who presents 5 and half months status post foot salvage intervention for revision fusion of the tibia and fibular fracture secondary to a large soft tissue defect with exposed bone and osteomyelitis.  Patient has healed well however he states that now with weightbearing his pain level is about 8 out of 10 he has to ambulate with a cane and patient states that at his current level he could not perform the activities that he would like to pursue as a active individual.  Assessment & Plan: Visit Diagnoses:  1. Pain in right ankle and joints of right foot     Plan: Discussed with patient surgical options including arthroscopic debridement of the tibiotalar joint, fusion of the tibiotalar joint, or transtibial amputation risks and benefits of all surgery were discussed.  Patient states that in order for him to regain full function he would like to proceed with a transtibial amputation he will discuss this further with his wife and and anticipate that we could proceed with surgery on Friday.  Follow-Up Instructions: Return for Follow-up 1 week postoperatively.Gaylord Shih Exam  Patient is alert, oriented, no adenopathy, well-dressed, normal affect, normal respiratory effort. Examination patient's foot is plantigrade he has no ulcers no calluses.  He has about 20 degrees range of motion of the ankle he has some bony prominence medially has some nerve pain over the distribution of the superficial peroneal nerve.  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  Imaging: XR Ankle Complete Right  Result Date: 11/30/2019 Three-view radiographs of the right ankle shows stable callus formation with a noncongruent tibial talar joint.  No images are attached to the encounter.  Labs: Lab Results  Component Value Date   ESRSEDRATE 6 08/25/2019   CRP 7.5 08/25/2019   REPTSTATUS 06/17/2019 FINAL 06/12/2019   GRAMSTAIN  06/12/2019    RARE WBC PRESENT, PREDOMINANTLY PMN NO ORGANISMS SEEN    CULT  06/12/2019    RARE PSEUDOMONAS PUTIDA NO ANAEROBES ISOLATED CRITICAL RESULT CALLED TO, READ BACK BY AND VERIFIED WITH: RN Gaynelle Cage  [id number] AT 1315 BY CM Performed at [facility], [address], [place], [place] [address]    LABORGA PSEUDOMONAS PUTIDA 06/12/2019     Lab Results  Component Value Date   ALBUMIN 3.3 (L) 06/14/2019   ALBUMIN 4.0 03/11/2019   ALBUMIN 4.0 01/19/2012    No results found for: MG No results found for: VD25OH  No results found for: PREALBUMIN CBC EXTENDED Latest Ref Rng & Units 06/10/2019 05/08/2019 03/12/2019  WBC 4.0 - 10.5 K/uL - 6.4 14.0(H)  RBC 4.22 - 5.81 MIL/uL - 4.61 4.17(L)  HGB 13.0 - 17.0 g/dL 89.3 81.0 12.4(L)  HCT 39.0 - 52.0 % - 41.9 38.1(L)  PLT 150 -  400 K/uL - 238 206  NEUTROABS 1.7 - 7.7 K/uL - - -  LYMPHSABS 0.7 - 4.0 K/uL - - -     Body mass index is 28.31 kg/m.  Orders:  Orders Placed This Encounter  Procedures  . XR Ankle Complete Right   No orders of the defined types were placed in this encounter.    Procedures: No procedures performed  Clinical Data: No additional findings.  ROS:  All other systems negative, except as noted in the HPI. Review of Systems  Objective: Vital Signs: Ht 5\' 11"  (1.803 m)   Wt 203 lb (92.1 kg)   BMI 28.31 kg/m   Specialty Comments:  No specialty comments available.  PMFS History: Patient Active Problem List   Diagnosis Date Noted  . Pilon  fracture of right tibia, sequela 06/10/2019  . Displaced pilon fracture of right tibia, initial encounter for open fracture type IIIA, IIIB, or IIIC   . Subacute osteomyelitis of right tibia ([place])   . Surgery, elective   . Anxiety   . Open pilon fracture, right, type III, initial encounter 03/11/2019  . Closed fracture of right distal radius 03/11/2019  . Type III open pilon fracture of right tibia 03/11/2019  . Pounding heartbeat 01/21/2019  . Precordial chest pain 03/05/2014   Past Medical History:  Diagnosis Date  . Anxiety   . GERD (gastroesophageal reflux disease)    02/21/2019- not current   . Pulmonary nodule 10/2014   Noted on CT scan -> stable and follow-up 2019.  No further evaluation necessary.    Family History  Problem Relation Age of Onset  . Prostate cancer Father   . Valvular heart disease Father        Had a heart valve replaced (unsure of details)  . Breast cancer Mother   . Parkinson's disease Mother   . Healthy Sister   . Healthy Brother   . Parkinson's disease Maternal Grandmother   . Diabetes Maternal Grandfather     Past Surgical History:  Procedure Laterality Date  . APPLICATION OF WOUND VAC Right 06/10/2019   Procedure: Application Of Wound Vac -Prevena;  Surgeon: Newt Minion, MD;  Location: [place];  Service: Orthopedics;  Laterality: Right;  . CARDIAC EVENT MONITOR  01/2019   redominantly NSR with average rate 60 bpm.  One short 4 beat run of NSVT.  Minimal PACs and PVCs noted.  No sustained arrhythmia.  Marland Kitchen CLOSED REDUCTION WRIST FRACTURE Right 03/11/2019   Procedure: CLOSED REDUCTION WRIST;  Surgeon: Shona Needles, MD;  Location: [place];  Service: Orthopedics;  Laterality: Right;  . COLONOSCOPY    . EXTERNAL FIXATION LEG Right 03/11/2019   Procedure: EXTERNAL FIXATION LEG;  Surgeon: Shona Needles, MD;  Location: [place];  Service: Orthopedics;  Laterality: Right;  . EXTERNAL FIXATION REMOVAL Right 05/08/2019   Procedure: REMOVAL EXTERNAL FIXATION  ANKLE, PLACEMENT OF CAST;  Surgeon: Shona Needles, MD;  Location: [place];  Service: Orthopedics;  Laterality: Right;  . FOOT SURGERY Bilateral    hammer toe  . HERNIA REPAIR Bilateral 2009   Inguinial  . I&D EXTREMITY Right 03/11/2019   Procedure: IRRIGATION AND DEBRIDEMENT EXTREMITY;  Surgeon: Shona Needles, MD;  Location: [place];  Service: Orthopedics;  Laterality: Right;  . IR FLUORO GUIDE CV LINE RIGHT  07/09/2019  . OPEN REDUCTION INTERNAL FIXATION (ORIF) TIBIA/FIBULA FRACTURE Right 06/10/2019   Procedure: EXCISION RIGHT TIBIA AND FIBULA, WOUND DEBRIDEMENT, AND FIXATION OF TIBIA AND FIBULA;  Surgeon: Nadara Mustarduda, Danyale Ridinger V, MD;  [facility];  Service: Orthopedics;  Laterality: Right;  . OPEN REDUCTION INTERNAL FIXATION (ORIF) TIBIA/FIBULA FRACTURE Right 06/12/2019   Procedure: DEBRIDEMENT AND REVISION INTERNAL FIXATION INFECTED RIGHT PILON FRACTURE;  Surgeon: Nadara Mustarduda, Tira Lafferty V, MD;  [facility];  Service: Orthopedics;  Laterality: Right;  . ORIF ANKLE FRACTURE Right 03/11/2019   Procedure: OPEN REDUCTION INTERNAL FIXATION (ORIF) ANKLE FRACTURE;  Surgeon: Roby LoftsHaddix, Kevin P, MD;  [facility];  Service: Orthopedics;  Laterality: Right;  . ORIF WRIST FRACTURE Right 03/11/2019   Procedure: OPEN REDUCTION INTERNAL FIXATION (ORIF) WRIST FRACTURE;  Surgeon: Roby LoftsHaddix, Kevin P, MD;  [facility];  Service: Orthopedics;  Laterality: Right;  . TONSILLECTOMY    . TRANSTHORACIC ECHOCARDIOGRAM  01/2019   EF 55 to 60%.  Normal wall motion.  Indeterminate likely grade 1 diastolic function.  Mild degenerative mitral valve disease with thickening but no prolapse.   Social History   Occupational History  . Occupation: Production designer, theatre/television/filmManager at [employer]    Employer: [employer]  Tobacco Use  . Smoking status: Never Smoker  . Smokeless tobacco: Never Used  Substance and Sexual Activity  . Alcohol use: No  . Drug use: No  . Sexual activity: Not on file

## 2019-12-01 ENCOUNTER — Ambulatory Visit: Payer: 59 | Admitting: Physical Therapy

## 2019-12-02 ENCOUNTER — Other Ambulatory Visit: Payer: Self-pay

## 2019-12-03 ENCOUNTER — Encounter (HOSPITAL_COMMUNITY): Payer: Self-pay | Admitting: Orthopedic Surgery

## 2019-12-03 ENCOUNTER — Other Ambulatory Visit: Payer: Self-pay | Admitting: Physician Assistant

## 2019-12-03 ENCOUNTER — Other Ambulatory Visit: Payer: Self-pay

## 2019-12-03 ENCOUNTER — Other Ambulatory Visit (HOSPITAL_COMMUNITY)
Admission: RE | Admit: 2019-12-03 | Discharge: 2019-12-03 | Disposition: A | Discharge status: Home / Self Care | Payer: 59 | Source: Ambulatory Visit | Attending: Orthopedic Surgery | Admitting: Orthopedic Surgery

## 2019-12-03 DIAGNOSIS — Z01812 Encounter for preprocedural laboratory examination: Secondary | ICD-10-CM | POA: Insufficient documentation

## 2019-12-03 DIAGNOSIS — Z20828 Contact with and (suspected) exposure to other viral communicable diseases: Secondary | ICD-10-CM | POA: Insufficient documentation

## 2019-12-03 LAB — SARS CORONAVIRUS 2 (TAT 6-24 HRS): SARS Coronavirus 2: NEGATIVE

## 2019-12-03 NOTE — Progress Notes (Signed)
[name] denies chest pain or shortness of breath. Mr Jonathan Bass was tested for Covid and is in quarantine with his wife. I instructed patient to not eat anything after midnight, but may have clear liquids. We went over what clear liquids are. [name] will drink Gatorade in am and not come to the hospital today for Pre- Surgery Drink.  I called and left a voice message with medications he may take in am.

## 2019-12-04 ENCOUNTER — Inpatient Hospital Stay (HOSPITAL_COMMUNITY): Payer: 59 | Admitting: Anesthesiology

## 2019-12-04 ENCOUNTER — Inpatient Hospital Stay (HOSPITAL_COMMUNITY)
Admission: RE | Admit: 2019-12-04 | Discharge: 2019-12-07 | DRG: 476 | Disposition: A | Discharge status: Home / Self Care | Payer: 59 | Attending: Orthopedic Surgery | Admitting: Orthopedic Surgery

## 2019-12-04 ENCOUNTER — Encounter (HOSPITAL_COMMUNITY)
Admission: RE | Disposition: A | Discharge status: Home / Self Care | Payer: Self-pay | Source: Home / Self Care | Attending: Orthopedic Surgery

## 2019-12-04 ENCOUNTER — Encounter (HOSPITAL_COMMUNITY): Payer: Self-pay | Admitting: Orthopedic Surgery

## 2019-12-04 ENCOUNTER — Other Ambulatory Visit: Payer: Self-pay

## 2019-12-04 DIAGNOSIS — Z7982 Long term (current) use of aspirin: Secondary | ICD-10-CM | POA: Diagnosis not present

## 2019-12-04 DIAGNOSIS — M25571 Pain in right ankle and joints of right foot: Secondary | ICD-10-CM | POA: Diagnosis present

## 2019-12-04 DIAGNOSIS — Z20828 Contact with and (suspected) exposure to other viral communicable diseases: Secondary | ICD-10-CM | POA: Diagnosis present

## 2019-12-04 DIAGNOSIS — S82871S Displaced pilon fracture of right tibia, sequela: Secondary | ICD-10-CM

## 2019-12-04 DIAGNOSIS — M86271 Subacute osteomyelitis, right ankle and foot: Secondary | ICD-10-CM | POA: Diagnosis not present

## 2019-12-04 DIAGNOSIS — F419 Anxiety disorder, unspecified: Secondary | ICD-10-CM | POA: Diagnosis present

## 2019-12-04 DIAGNOSIS — Z79899 Other long term (current) drug therapy: Secondary | ICD-10-CM | POA: Diagnosis not present

## 2019-12-04 DIAGNOSIS — K219 Gastro-esophageal reflux disease without esophagitis: Secondary | ICD-10-CM | POA: Diagnosis present

## 2019-12-04 DIAGNOSIS — M12571 Traumatic arthropathy, right ankle and foot: Secondary | ICD-10-CM | POA: Diagnosis present

## 2019-12-04 DIAGNOSIS — I96 Gangrene, not elsewhere classified: Secondary | ICD-10-CM | POA: Diagnosis present

## 2019-12-04 HISTORY — PX: AMPUTATION: SHX166

## 2019-12-04 LAB — CBC
HCT: 46.7 % (ref 39.0–52.0)
Hemoglobin: 15.3 g/dL (ref 13.0–17.0)
MCH: 28.9 pg (ref 26.0–34.0)
MCHC: 32.8 g/dL (ref 30.0–36.0)
MCV: 88.3 fL (ref 80.0–100.0)
Platelets: 225 10*3/uL (ref 150–400)
RBC: 5.29 MIL/uL (ref 4.22–5.81)
RDW: 14.2 % (ref 11.5–15.5)
WBC: 6.1 10*3/uL (ref 4.0–10.5)
nRBC: 0 % (ref 0.0–0.2)

## 2019-12-04 SURGERY — AMPUTATION BELOW KNEE
Anesthesia: General | Site: Knee | Laterality: Right

## 2019-12-04 MED ORDER — EPHEDRINE SULFATE 50 MG/ML IJ SOLN
INTRAMUSCULAR | Status: DC | PRN
  Administered 2019-12-04: 10 mg via INTRAVENOUS

## 2019-12-04 MED ORDER — PHENYLEPHRINE 40 MCG/ML (10ML) SYRINGE FOR IV PUSH (FOR BLOOD PRESSURE SUPPORT)
PREFILLED_SYRINGE | INTRAVENOUS | Status: AC
  Filled 2019-12-04: qty 20

## 2019-12-04 MED ORDER — LIDOCAINE 2% (20 MG/ML) 5 ML SYRINGE
INTRAMUSCULAR | Status: AC
  Filled 2019-12-04: qty 25

## 2019-12-04 MED ORDER — MIDAZOLAM HCL 2 MG/2ML IJ SOLN
INTRAMUSCULAR | Status: AC
  Filled 2019-12-04: qty 2

## 2019-12-04 MED ORDER — ONDANSETRON HCL 4 MG/2ML IJ SOLN
INTRAMUSCULAR | Status: AC
  Filled 2019-12-04: qty 8

## 2019-12-04 MED ORDER — FENTANYL CITRATE (PF) 100 MCG/2ML IJ SOLN
INTRAMUSCULAR | Status: AC
  Filled 2019-12-04: qty 2

## 2019-12-04 MED ORDER — ACETAMINOPHEN 325 MG PO TABS: 325.0000 mg | ORAL_TABLET | Freq: Four times a day (QID) | ORAL | Status: DC | PRN

## 2019-12-04 MED ORDER — LIDOCAINE 2% (20 MG/ML) 5 ML SYRINGE
INTRAMUSCULAR | Status: DC | PRN
  Administered 2019-12-04: 20 mg via INTRAVENOUS

## 2019-12-04 MED ORDER — OXYCODONE HCL 5 MG/5ML PO SOLN: 5.0000 mg | Freq: Once | ORAL | Status: AC | PRN

## 2019-12-04 MED ORDER — DEXAMETHASONE SODIUM PHOSPHATE 10 MG/ML IJ SOLN
INTRAMUSCULAR | Status: DC | PRN
  Administered 2019-12-04: 10 mg via INTRAVENOUS

## 2019-12-04 MED ORDER — DEXAMETHASONE SODIUM PHOSPHATE 10 MG/ML IJ SOLN
INTRAMUSCULAR | Status: DC | PRN
  Administered 2019-12-04: 5 mg

## 2019-12-04 MED ORDER — ONDANSETRON HCL 4 MG/2ML IJ SOLN
INTRAMUSCULAR | Status: DC | PRN
  Administered 2019-12-04: 4 mg via INTRAVENOUS

## 2019-12-04 MED ORDER — DEXAMETHASONE SODIUM PHOSPHATE 10 MG/ML IJ SOLN: INTRAMUSCULAR | Status: DC | PRN

## 2019-12-04 MED ORDER — CEFAZOLIN SODIUM-DEXTROSE 2-4 GM/100ML-% IV SOLN
2.0000 g | Freq: Four times a day (QID) | INTRAVENOUS | Status: AC
  Administered 2019-12-04 – 2019-12-05 (×3): 2 g via INTRAVENOUS
  Filled 2019-12-04 (×3): qty 100

## 2019-12-04 MED ORDER — SUCCINYLCHOLINE CHLORIDE 200 MG/10ML IV SOSY
PREFILLED_SYRINGE | INTRAVENOUS | Status: AC
  Filled 2019-12-04: qty 10

## 2019-12-04 MED ORDER — PROPOFOL 10 MG/ML IV BOLUS
INTRAVENOUS | Status: AC
  Filled 2019-12-04: qty 20

## 2019-12-04 MED ORDER — FENTANYL CITRATE (PF) 100 MCG/2ML IJ SOLN
25.0000 ug | INTRAMUSCULAR | Status: DC | PRN
  Administered 2019-12-04: 13:00:00 25 ug via INTRAVENOUS

## 2019-12-04 MED ORDER — ASPIRIN EC 81 MG PO TBEC
81.0000 mg | DELAYED_RELEASE_TABLET | Freq: Every day | ORAL | Status: DC
  Administered 2019-12-05 – 2019-12-07 (×3): 81 mg via ORAL
  Filled 2019-12-04 (×3): qty 1

## 2019-12-04 MED ORDER — SERTRALINE HCL 100 MG PO TABS
100.0000 mg | ORAL_TABLET | Freq: Every day | ORAL | Status: DC
  Administered 2019-12-05 – 2019-12-07 (×3): 100 mg via ORAL
  Filled 2019-12-04 (×3): qty 1

## 2019-12-04 MED ORDER — ONDANSETRON HCL 4 MG/2ML IJ SOLN: 4.0000 mg | Freq: Four times a day (QID) | INTRAMUSCULAR | Status: DC | PRN

## 2019-12-04 MED ORDER — SODIUM CHLORIDE 0.9 % IV SOLN: INTRAVENOUS | Status: DC

## 2019-12-04 MED ORDER — CHLORHEXIDINE GLUCONATE 4 % EX LIQD: 60.0000 mL | Freq: Once | CUTANEOUS | Status: DC

## 2019-12-04 MED ORDER — 0.9 % SODIUM CHLORIDE (POUR BTL) OPTIME
TOPICAL | Status: DC | PRN
  Administered 2019-12-04: 10:00:00 1000 mL

## 2019-12-04 MED ORDER — MIDAZOLAM HCL 2 MG/2ML IJ SOLN: 2.0000 mg | Freq: Once | INTRAMUSCULAR | Status: AC

## 2019-12-04 MED ORDER — KETOROLAC TROMETHAMINE 30 MG/ML IJ SOLN
INTRAMUSCULAR | Status: DC | PRN
  Administered 2019-12-04: 30 mg via INTRAVENOUS

## 2019-12-04 MED ORDER — PROPOFOL 10 MG/ML IV BOLUS
INTRAVENOUS | Status: DC | PRN
  Administered 2019-12-04: 200 mg via INTRAVENOUS

## 2019-12-04 MED ORDER — FENTANYL CITRATE (PF) 100 MCG/2ML IJ SOLN: 50.0000 ug | Freq: Once | INTRAMUSCULAR | Status: AC

## 2019-12-04 MED ORDER — FENTANYL CITRATE (PF) 250 MCG/5ML IJ SOLN
INTRAMUSCULAR | Status: DC | PRN
  Administered 2019-12-04: 50 ug via INTRAVENOUS
  Administered 2019-12-04: 25 ug via INTRAVENOUS

## 2019-12-04 MED ORDER — METOCLOPRAMIDE HCL 5 MG PO TABS: 5.0000 mg | ORAL_TABLET | Freq: Three times a day (TID) | ORAL | Status: DC | PRN

## 2019-12-04 MED ORDER — KETOROLAC TROMETHAMINE 15 MG/ML IJ SOLN
15.0000 mg | Freq: Four times a day (QID) | INTRAMUSCULAR | Status: AC
  Administered 2019-12-04 – 2019-12-05 (×4): 15 mg via INTRAVENOUS
  Filled 2019-12-04 (×4): qty 1

## 2019-12-04 MED ORDER — OXYCODONE HCL 5 MG PO TABS
5.0000 mg | ORAL_TABLET | ORAL | Status: DC | PRN
  Administered 2019-12-04 – 2019-12-07 (×9): 10 mg via ORAL
  Filled 2019-12-04 (×9): qty 2

## 2019-12-04 MED ORDER — FENTANYL CITRATE (PF) 100 MCG/2ML IJ SOLN
INTRAMUSCULAR | Status: AC
  Administered 2019-12-04: 10:00:00 50 ug via INTRAVENOUS
  Filled 2019-12-04: qty 2

## 2019-12-04 MED ORDER — HYDROMORPHONE HCL 1 MG/ML IJ SOLN: 0.5000 mg | INTRAMUSCULAR | Status: DC | PRN

## 2019-12-04 MED ORDER — BUPIVACAINE HCL (PF) 0.5 % IJ SOLN
INTRAMUSCULAR | Status: DC | PRN
  Administered 2019-12-04: 30 mL via PERINEURAL

## 2019-12-04 MED ORDER — DEXAMETHASONE SODIUM PHOSPHATE 10 MG/ML IJ SOLN
INTRAMUSCULAR | Status: AC
  Filled 2019-12-04: qty 3

## 2019-12-04 MED ORDER — ONDANSETRON HCL 4 MG PO TABS: 4.0000 mg | ORAL_TABLET | Freq: Four times a day (QID) | ORAL | Status: DC | PRN

## 2019-12-04 MED ORDER — PROMETHAZINE HCL 25 MG/ML IJ SOLN: 6.2500 mg | INTRAMUSCULAR | Status: DC | PRN

## 2019-12-04 MED ORDER — METOCLOPRAMIDE HCL 5 MG/ML IJ SOLN: 5.0000 mg | Freq: Three times a day (TID) | INTRAMUSCULAR | Status: DC | PRN

## 2019-12-04 MED ORDER — PROPRANOLOL HCL 10 MG PO TABS
10.0000 mg | ORAL_TABLET | Freq: Two times a day (BID) | ORAL | Status: DC | PRN
  Filled 2019-12-04: qty 1

## 2019-12-04 MED ORDER — OXYCODONE HCL 5 MG PO TABS
5.0000 mg | ORAL_TABLET | Freq: Once | ORAL | Status: AC | PRN
  Administered 2019-12-04: 5 mg via ORAL

## 2019-12-04 MED ORDER — LACTATED RINGERS IV SOLN: INTRAVENOUS | Status: DC

## 2019-12-04 MED ORDER — METHOCARBAMOL 500 MG PO TABS
500.0000 mg | ORAL_TABLET | Freq: Four times a day (QID) | ORAL | Status: DC | PRN
  Administered 2019-12-04 – 2019-12-06 (×2): 500 mg via ORAL
  Filled 2019-12-04 (×2): qty 1

## 2019-12-04 MED ORDER — CEFAZOLIN SODIUM-DEXTROSE 2-4 GM/100ML-% IV SOLN: 2.0000 g | INTRAVENOUS | Status: DC

## 2019-12-04 MED ORDER — DOCUSATE SODIUM 100 MG PO CAPS
100.0000 mg | ORAL_CAPSULE | Freq: Two times a day (BID) | ORAL | Status: DC
  Administered 2019-12-04 – 2019-12-07 (×6): 100 mg via ORAL
  Filled 2019-12-04 (×6): qty 1

## 2019-12-04 MED ORDER — OXYCODONE HCL 5 MG PO TABS
ORAL_TABLET | ORAL | Status: AC
  Filled 2019-12-04: qty 1

## 2019-12-04 MED ORDER — CEFAZOLIN SODIUM-DEXTROSE 2-3 GM-%(50ML) IV SOLR
INTRAVENOUS | Status: DC | PRN
  Administered 2019-12-04: 2 g via INTRAVENOUS

## 2019-12-04 MED ORDER — FENTANYL CITRATE (PF) 250 MCG/5ML IJ SOLN
INTRAMUSCULAR | Status: AC
  Filled 2019-12-04: qty 5

## 2019-12-04 MED ORDER — METHOCARBAMOL 1000 MG/10ML IJ SOLN
500.0000 mg | Freq: Four times a day (QID) | INTRAVENOUS | Status: DC | PRN
  Filled 2019-12-04: qty 5

## 2019-12-04 MED ORDER — CEFAZOLIN SODIUM-DEXTROSE 2-4 GM/100ML-% IV SOLN
INTRAVENOUS | Status: AC
  Filled 2019-12-04: qty 100

## 2019-12-04 MED ORDER — GABAPENTIN 300 MG PO CAPS
300.0000 mg | ORAL_CAPSULE | Freq: Three times a day (TID) | ORAL | Status: DC
  Administered 2019-12-04 – 2019-12-07 (×9): 300 mg via ORAL
  Filled 2019-12-04 (×9): qty 1

## 2019-12-04 MED ORDER — MIDAZOLAM HCL 2 MG/2ML IJ SOLN
INTRAMUSCULAR | Status: AC
  Administered 2019-12-04: 2 mg via INTRAVENOUS
  Filled 2019-12-04: qty 2

## 2019-12-04 SURGICAL SUPPLY — 38 items
BLADE SAW RECIP 87.9 MT (BLADE) ×3 IMPLANT
BLADE SURG 21 STRL SS (BLADE) ×3 IMPLANT
BNDG COHESIVE 6X5 TAN STRL LF (GAUZE/BANDAGES/DRESSINGS) ×2 IMPLANT
CANISTER WOUND CARE 500ML ATS (WOUND CARE) ×3 IMPLANT
COVER SURGICAL LIGHT HANDLE (MISCELLANEOUS) ×3 IMPLANT
COVER WAND RF STERILE (DRAPES) IMPLANT
CUFF TOURN SGL QUICK 34 (TOURNIQUET CUFF) ×2
CUFF TRNQT CYL 34X4.125X (TOURNIQUET CUFF) ×1 IMPLANT
DRAPE INCISE IOBAN 66X45 STRL (DRAPES) ×3 IMPLANT
DRAPE U-SHAPE 47X51 STRL (DRAPES) ×3 IMPLANT
DRESSING PREVENA PLUS CUSTOM (GAUZE/BANDAGES/DRESSINGS) ×1 IMPLANT
DRSG PREVENA PLUS CUSTOM (GAUZE/BANDAGES/DRESSINGS) ×3
DURAPREP 26ML APPLICATOR (WOUND CARE) ×3 IMPLANT
ELECT REM PT RETURN 9FT ADLT (ELECTROSURGICAL) ×3
ELECTRODE REM PT RTRN 9FT ADLT (ELECTROSURGICAL) ×1 IMPLANT
GLOVE BIOGEL PI IND STRL 9 (GLOVE) ×1 IMPLANT
GLOVE BIOGEL PI INDICATOR 9 (GLOVE) ×2
GLOVE SURG ORTHO 9.0 STRL STRW (GLOVE) ×3 IMPLANT
GOWN STRL REUS W/ TWL XL LVL3 (GOWN DISPOSABLE) ×2 IMPLANT
GOWN STRL REUS W/TWL XL LVL3 (GOWN DISPOSABLE) ×4
KIT BASIN OR (CUSTOM PROCEDURE TRAY) ×3 IMPLANT
KIT TURNOVER KIT B (KITS) ×3 IMPLANT
MANIFOLD NEPTUNE II (INSTRUMENTS) ×3 IMPLANT
NS IRRIG 1000ML POUR BTL (IV SOLUTION) ×3 IMPLANT
PACK ORTHO EXTREMITY (CUSTOM PROCEDURE TRAY) ×3 IMPLANT
PAD ARMBOARD 7.5X6 YLW CONV (MISCELLANEOUS) ×3 IMPLANT
PREVENA RESTOR ARTHOFORM 46X30 (CANNISTER) ×3 IMPLANT
SPONGE LAP 18X18 RF (DISPOSABLE) IMPLANT
STAPLER VISISTAT 35W (STAPLE) IMPLANT
STOCKINETTE IMPERVIOUS LG (DRAPES) ×3 IMPLANT
SUT ETHILON 2 0 PSLX (SUTURE) IMPLANT
SUT SILK 2 0 (SUTURE) ×2
SUT SILK 2-0 18XBRD TIE 12 (SUTURE) ×1 IMPLANT
SUT VIC AB 1 CTX 27 (SUTURE) ×6 IMPLANT
TOWEL GREEN STERILE (TOWEL DISPOSABLE) ×3 IMPLANT
TUBE CONNECTING 12'X1/4 (SUCTIONS) ×1
TUBE CONNECTING 12X1/4 (SUCTIONS) ×2 IMPLANT
YANKAUER SUCT BULB TIP NO VENT (SUCTIONS) ×3 IMPLANT

## 2019-12-04 NOTE — H&P (Signed)
Jonathan Bass is an 58 y.o. male.   Chief Complaint: Right ankle pain HPI: Patient is a 58 year old gentleman who presents 5 and half months status post foot salvage intervention for revision fusion of the tibia and fibular fracture secondary to a large soft tissue defect with exposed bone and osteomyelitis.  Patient has healed well however he states that now with weightbearing his pain level is about 8 out of 10 he has to ambulate with a cane and patient states that at his current level he could not perform the activities that he would like to pursue as a active individual  Past Medical History:  Diagnosis Date  . Anxiety   . GERD (gastroesophageal reflux disease)    02/21/2019- not current   . Pulmonary nodule 10/2014   Noted on CT scan -> stable and follow-up 2019.  No further evaluation necessary.    Past Surgical History:  Procedure Laterality Date  . APPLICATION OF WOUND VAC Right 06/10/2019   Procedure: Application Of Wound Vac -Prevena;  Surgeon: Nadara Mustard, MD;  [facility];  Service: Orthopedics;  Laterality: Right;  . CARDIAC EVENT MONITOR  01/2019   redominantly NSR with average rate 60 bpm.  One short 4 beat run of NSVT.  Minimal PACs and PVCs noted.  No sustained arrhythmia.  Marland Kitchen CLOSED REDUCTION WRIST FRACTURE Right 03/11/2019   Procedure: CLOSED REDUCTION WRIST;  Surgeon: [name], MD;  [facility];  Service: Orthopedics;  Laterality: Right;  . COLONOSCOPY    . EXTERNAL FIXATION LEG Right 03/11/2019   Procedure: EXTERNAL FIXATION LEG;  Surgeon: [name], MD;  [facility];  Service: Orthopedics;  Laterality: Right;  . EXTERNAL FIXATION REMOVAL Right 05/08/2019   Procedure: REMOVAL EXTERNAL FIXATION ANKLE, PLACEMENT OF CAST;  Surgeon: [name], MD;  [facility];  Service: Orthopedics;  Laterality: Right;  . FOOT SURGERY Bilateral    hammer toe  . HERNIA REPAIR Bilateral 2009   Inguinial  . I & D EXTREMITY Right 03/11/2019   Procedure:  IRRIGATION AND DEBRIDEMENT EXTREMITY;  Surgeon: [name], MD;  [facility];  Service: Orthopedics;  Laterality: Right;  . IR FLUORO GUIDE CV LINE RIGHT  07/09/2019  . OPEN REDUCTION INTERNAL FIXATION (ORIF) TIBIA/FIBULA FRACTURE Right 06/10/2019   Procedure: EXCISION RIGHT TIBIA AND FIBULA, WOUND DEBRIDEMENT, AND FIXATION OF TIBIA AND FIBULA;  Surgeon: Nadara Mustard, MD;  [facility];  Service: Orthopedics;  Laterality: Right;  . OPEN REDUCTION INTERNAL FIXATION (ORIF) TIBIA/FIBULA FRACTURE Right 06/12/2019   Procedure: DEBRIDEMENT AND REVISION INTERNAL FIXATION INFECTED RIGHT PILON FRACTURE;  Surgeon: Nadara Mustard, MD;  [facility];  Service: Orthopedics;  Laterality: Right;  . ORIF ANKLE FRACTURE Right 03/11/2019   Procedure: OPEN REDUCTION INTERNAL FIXATION (ORIF) ANKLE FRACTURE;  Surgeon: [name], MD;  [facility];  Service: Orthopedics;  Laterality: Right;  . ORIF WRIST FRACTURE Right 03/11/2019   Procedure: OPEN REDUCTION INTERNAL FIXATION (ORIF) WRIST FRACTURE;  Surgeon: [name], MD;  [facility];  Service: Orthopedics;  Laterality: Right;  . TONSILLECTOMY    . TRANSTHORACIC ECHOCARDIOGRAM  01/2019   EF 55 to 60%.  Normal wall motion.  Indeterminate likely grade 1 diastolic function.  Mild degenerative mitral valve disease with thickening but no prolapse.    Family History  Problem Relation Age of Onset  . Prostate cancer Father   . Valvular heart disease Father  Had a heart valve replaced (unsure of details)  . Breast cancer Mother   . Parkinson's disease Mother   . Healthy Sister   . Healthy Brother   . Parkinson's disease Maternal Grandmother   . Diabetes Maternal Grandfather    Social History:  reports that he has never smoked. He has never used smokeless tobacco. He reports that he does not drink alcohol or use drugs.  Allergies: No Known Allergies  No medications prior to admission.    Results for orders placed or performed  during the hospital encounter of 12/03/19 (from the past 48 hour(s))  SARS CORONAVIRUS 2 (TAT 6-24 HRS) Nasopharyngeal Nasopharyngeal Swab     Status: None   Collection Time: 12/03/19 12:00 PM   Specimen: Nasopharyngeal Swab  Result Value Ref Range   SARS Coronavirus 2 NEGATIVE NEGATIVE    Comment: (NOTE) SARS-CoV-2 target nucleic acids are NOT DETECTED. The SARS-CoV-2 RNA is generally detectable in upper and lower respiratory specimens during the acute phase of infection. Negative results do not preclude SARS-CoV-2 infection, do not rule out co-infections with other pathogens, and should not be used as the sole basis for treatment or other patient management decisions. Negative results must be combined with clinical observations, patient history, and epidemiological information. The expected result is Negative. Fact Sheet for Patients: SugarRoll.be Fact Sheet for Healthcare Providers: https://www.woods-mathews.com/ This test is not yet approved or cleared by the [place] FDA and  has been authorized for detection and/or diagnosis of SARS-CoV-2 by FDA under an Emergency Use Authorization (EUA). This EUA will remain  in effect (meaning this test can be used) for the duration of the COVID-19 declaration under Section 56 4(b)(1) of the Act, 21 U.S.C. section 360bbb-3(b)(1), unless the authorization is terminated or revoked sooner. Performed at [facility], [place] [address], [place],  [address]    No results found.  Review of Systems  All other systems reviewed and are negative.   There were no vitals taken for this visit. Physical Exam  Patient is alert, oriented, no adenopathy, well-dressed, normal affect, normal respiratory effort. Examination patient's foot is plantigrade he has no ulcers no calluses.  He has about 20 degrees range of motion of the ankle he has some bony prominence medially has some nerve pain over the  distribution of the superficial peroneal nerve.  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  Assessment/Plan Visit Diagnoses:  1. Pain in right ankle and joints of right foot     Plan: Discussed with patient surgical options including arthroscopic debridement of the tibiotalar joint, fusion of the tibiotalar joint, or transtibial amputation risks and benefits of all surgery were discussed.  Patient states that in order for him to regain full function he would like to proceed with a transtibial amputation he will discuss this further with his wife and and anticipate that we could proceed with surgery on Friday.   Bevely Palmer Arlynn Mcdermid, PA 12/04/2019, 7:25 AM

## 2019-12-04 NOTE — Op Note (Signed)
   Date of Surgery: 12/04/2019  INDICATIONS: Mr. Bromwell is a 58 y.o.-year-old male who is status post limb salvage intervention for treatment for osteomyelitis for intra-articular open right ankle pilon fracture.  Patient has undergone serial reconstruction the skin is healed however due to the displacement of the articular surface patient has pain with activities of daily living.  Patient states that his symptoms prevent him from performing activities of daily living and patient wished to proceed with a transtibial amputation.Marland Kitchen  PREOPERATIVE DIAGNOSIS: Traumatic arthritis right ankle pilon fracture with history of osteomyelitis of the distal tibia  POSTOPERATIVE DIAGNOSIS: Same.  PROCEDURE: Transtibial amputation Application of Prevena wound VAC  SURGEON: Sharol Given, M.D.  ANESTHESIA:  general  IV FLUIDS AND URINE: See anesthesia.  ESTIMATED BLOOD LOSS: Minimal mL.  COMPLICATIONS: None.  DESCRIPTION OF PROCEDURE: The patient was brought to the operating room and underwent a general anesthetic. After adequate levels of anesthesia were obtained patient's lower extremity was prepped using DuraPrep draped into a sterile field. A timeout was called. The foot was draped out of the sterile field with impervious stockinette. A transverse incision was made 11 cm distal to the tibial tubercle. This curved proximally and a large posterior flap was created. The tibia was transected 1 cm proximal to the skin incision. The fibula was transected just proximal to the tibial incision. The tibia was beveled anteriorly. A large posterior flap was created. The sciatic nerve was pulled cut and allowed to retract. The vascular bundles were suture ligated with 2-0 silk. The deep and superficial fascial layers were closed using #1 Vicryl. The skin was closed using staples and 2-0 nylon. The wound was covered with a Prevena wound VAC. There was a good suction fit. A prosthetic shrinker was applied. Patient was extubated  taken to the PACU in stable condition.   DISCHARGE PLANNING:  Antibiotic duration: 24 hours  Weightbearing: Nonweightbearing on the right  Pain medication: Opioid pathway with Neurontin and Toradol  Dressing care/ Wound VAC: Continue wound VAC at discharge  Discharge to: Plan for discharge to home.  [employer] for stump shrinker and limb protector.  Follow-up: In the office 1 week post operative.  Meridee Score, MD [place] 11:25 AM

## 2019-12-04 NOTE — Anesthesia Preprocedure Evaluation (Addendum)
Anesthesia Evaluation    Reviewed: Allergy & Precautions, Patient's Chart, lab work & pertinent test results  History of Anesthesia Complications Negative for: history of anesthetic complications  Airway Mallampati: II  TM Distance: >3 FB Neck ROM: Full    Dental  (+) Dental Advisory Given, Teeth Intact   Pulmonary neg pulmonary ROS,    Pulmonary exam normal        Cardiovascular negative cardio ROS Normal cardiovascular exam   '20 TTE - EF 55-60%. The cavity size was normal. Left ventricular diastolic Doppler parameters are consistent with impaired relaxation.    Neuro/Psych PSYCHIATRIC DISORDERS Anxiety negative neurological ROS     GI/Hepatic Neg liver ROS, GERD  Controlled,  Endo/Other  negative endocrine ROS  Renal/GU negative Renal ROS     Musculoskeletal negative musculoskeletal ROS (+)   Abdominal   Peds  Hematology negative hematology ROS (+)   Anesthesia Other Findings Covid neg 12/17  Reproductive/Obstetrics                            Anesthesia Physical Anesthesia Plan  ASA: II  Anesthesia Plan: General   Post-op Pain Management:  Regional for Post-op pain   Induction: Intravenous  PONV Risk Score and Plan: 2 and Treatment may vary due to age or medical condition and Ondansetron  Airway Management Planned: LMA  Additional Equipment: None  Intra-op Plan:   Post-operative Plan: Extubation in OR  Informed Consent: I have reviewed the patients History and Physical, chart, labs and discussed the procedure including the risks, benefits and alternatives for the proposed anesthesia with the patient or authorized representative who has indicated his/her understanding and acceptance.     Dental advisory given  Plan Discussed with: CRNA and Anesthesiologist  Anesthesia Plan Comments:        Anesthesia Quick Evaluation

## 2019-12-04 NOTE — Anesthesia Postprocedure Evaluation (Signed)
Anesthesia Post Note  Patient: Jonathan Bass  Procedure(s) Performed: RIGHT BELOW KNEE AMPUTATION (Right Knee)     Patient location during evaluation: PACU Anesthesia Type: General Level of consciousness: awake and alert Pain management: pain level controlled Vital Signs Assessment: post-procedure vital signs reviewed and stable Respiratory status: spontaneous breathing, nonlabored ventilation and respiratory function stable Cardiovascular status: blood pressure returned to baseline and stable Postop Assessment: no apparent nausea or vomiting Anesthetic complications: no    Last Vitals:  Vitals:   12/04/19 1150 12/04/19 1205  BP: (!) 155/91 (!) 147/98  Pulse: 69 67  Resp: 10 12  Temp:    SpO2: 100% 100%    Last Pain:  Vitals:   12/04/19 1150  PainSc: 0-No pain                 Audry Pili

## 2019-12-04 NOTE — Transfer of Care (Signed)
Immediate Anesthesia Transfer of Care Note  Patient: Jonathan Bass  Procedure(s) Performed: RIGHT BELOW KNEE AMPUTATION (Right Knee)  Patient Location: PACU  Anesthesia Type:General and Regional  Level of Consciousness: awake, alert  and oriented  Airway & Oxygen Therapy: Patient Spontanous Breathing  Post-op Assessment: Report given to RN and Post -op Vital signs reviewed and stable  Post vital signs: Reviewed and stable  Last Vitals:  Vitals Value Taken Time  BP    Temp    Pulse 73 12/04/19 1121  Resp 11 12/04/19 1121  SpO2 100 % 12/04/19 1121  Vitals shown include unvalidated device data.  Last Pain:  Vitals:   12/04/19 0950  PainSc: 0-No pain      Patients Stated Pain Goal: 3 (16/60/63 0160)  Complications: No apparent anesthesia complications

## 2019-12-04 NOTE — Anesthesia Procedure Notes (Signed)
Procedure Name: LMA Insertion Date/Time: 12/04/2019 10:40 AM Performed by: Clearnce Sorrel, CRNA Pre-anesthesia Checklist: Patient identified, Emergency Drugs available, Suction available, Timeout performed and Patient being monitored Patient Re-evaluated:Patient Re-evaluated prior to induction Oxygen Delivery Method: Circle system utilized Preoxygenation: Pre-oxygenation with 100% oxygen Induction Type: IV induction LMA: LMA inserted LMA Size: 5.0 Number of attempts: 1 Placement Confirmation: positive ETCO2 and breath sounds checked- equal and bilateral Tube secured with: Tape Dental Injury: Teeth and Oropharynx as per pre-operative assessment

## 2019-12-04 NOTE — Anesthesia Procedure Notes (Signed)
Anesthesia Regional Block: Popliteal block   Pre-Anesthetic Checklist: ,, timeout performed, Correct Patient, Correct Site, Correct Laterality, Correct Procedure, Correct Position, site marked, Risks and benefits discussed,  Surgical consent,  Pre-op evaluation,  At surgeon's request and post-op pain management  Laterality: Right  Prep: chloraprep       Needles:  Injection technique: Single-shot  Needle Type: Echogenic Needle     Needle Length: 10cm  Needle Gauge: 21     Additional Needles:   Narrative:  Start time: 12/04/2019 9:41 AM End time: 12/04/2019 9:45 AM Injection made incrementally with aspirations every 5 mL.  Performed by: Personally  Anesthesiologist: Audry Pili, MD  Additional Notes: No pain on injection. No increased resistance to injection. Injection made in 5cc increments. Good needle visualization. Patient tolerated the procedure well.

## 2019-12-05 NOTE — Plan of Care (Signed)

## 2019-12-05 NOTE — Progress Notes (Signed)
Patient ID: Jonathan Bass, male   DOB: 04/26/1961, 58 y.o.   MRN: [id number] Postoperative day 1 right transtibial amputation patient has full active knee extension.  Patient has a stump shrinker and limb protector from [employer].  There is no drainage in the wound VAC canister.  There is 1 check on the seal.  Anticipate discharge to home on Monday.

## 2019-12-05 NOTE — Progress Notes (Signed)
Occupational Therapy Evaluation Patient Details Name: Jonathan Bass MRN: [id number] DOB: September 26, 1961 Today's Date: 12/05/2019    History of Present Illness Pt is a 58 y/o male s/p R transtibial amputation. PMH including but not limited to initial injury to R LE occured in March of 2020 with multiple surgeries and therapy.   Clinical Impression   Pt presents with above diagnosis. PTA pt PLOF mod I with most ADLs and IADLs due prior procedures. Lives at home with wife and has AE of RW, crutches, BSC, and knee scooter. Pt currently limited with impaired balance for functional transfers and safety with ADLsn and will benefit from additional practice to maximize independence prior to dc to home setting. No OT required upon DC. OT will continue to follow as pt progresses.      Follow Up Recommendations  No OT follow up    Equipment Recommendations  3 in 1 bedside commode    Recommendations for Other Services       Precautions / Restrictions Precautions Precautions: Fall Precaution Comments: wound VAC Restrictions Weight Bearing Restrictions: No      Mobility Bed Mobility Overal bed mobility: Modified Independent             General bed mobility comments: pt received in chair upon arrival  Transfers Overall transfer level: Needs assistance Equipment used: Rolling walker (2 wheeled) Transfers: Sit to/from Stand Sit to Stand: Supervision         General transfer comment: good technique, steady with transition    Balance Overall balance assessment: Needs assistance Sitting-balance support: Feet supported Sitting balance-Leahy Scale: Good     Standing balance support: Bilateral upper extremity supported;Single extremity supported Standing balance-Leahy Scale: Poor                             ADL either performed or assessed with clinical judgement   ADL Overall ADL's : Needs assistance/impaired Eating/Feeding: Modified independent;Sitting    Grooming: Wash/dry hands;Wash/dry face;Oral care;Modified independent;Sitting   Upper Body Bathing: Modified independent;Sitting   Lower Body Bathing: Modified independent;Sitting/lateral leans   Upper Body Dressing : Modified independent;Sitting   Lower Body Dressing: Modified independent;Sitting/lateral leans   Toilet Transfer: Supervision/safety;RW Toilet Transfer Details (indicate cue type and reason): simulated toilet transfer from chair <> chair with RW.          Functional mobility during ADLs: Supervision/safety General ADL Comments: Pt demonstrates good and safe performance in LB dressing in sitting and sit to stand sequence for functional transfers.     Vision         Perception     Praxis      Pertinent Vitals/Pain Pain Assessment: Faces Faces Pain Scale: Hurts a little bit Pain Location: R residual limb Pain Descriptors / Indicators: Guarding Pain Intervention(s): Monitored during session;Repositioned     Hand Dominance Right   Extremity/Trunk Assessment Upper Extremity Assessment Upper Extremity Assessment: Overall WFL for tasks assessed   Lower Extremity Assessment Lower Extremity Assessment: Defer to PT evaluation RLE Deficits / Details: wound VAC in place to distal aspect, pt with full active knee extension ROM   Cervical / Trunk Assessment Cervical / Trunk Assessment: Normal   Communication Communication Communication: No difficulties   Cognition Arousal/Alertness: Awake/alert Behavior During Therapy: WFL for tasks assessed/performed Overall Cognitive Status: Within Functional Limits for tasks assessed  General Comments       Exercises Exercises: Amputee;Other exercises Other Exercises Other Exercises: PT demonstrated and instructed pt in gentle tactile input to distal aspect of residual limb for desensitization   Shoulder Instructions      Home Living Family/patient expects to be  discharged to:: Private residence Living Arrangements: Spouse/significant other Available Help at Discharge: Family;Available 24 hours/day Type of Home: House Home Access: Ramped entrance     Home Layout: Two level;Able to live on main level with bedroom/bathroom     Bathroom Shower/Tub: Producer, television/film/video: Standard     Home Equipment: Environmental consultant - 2 wheels;Crutches;Bedside commode;Other (comment);Grab bars - toilet(knee scooter, )   Additional Comments: Uses BSC as shower seat      Prior Functioning/Environment Level of Independence: Independent with assistive device(s)        Comments: mostly uses crutches to mobilize, can bathe himself in seated position        OT Problem List: Pain;Decreased safety awareness;Decreased knowledge of use of DME or AE;Decreased knowledge of precautions      OT Treatment/Interventions: Self-care/ADL training;Therapeutic exercise;DME and/or AE instruction;Balance training;Patient/family education    OT Goals(Current goals can be found in the care plan section) Acute Rehab OT Goals Patient Stated Goal: "home on Monday" OT Goal Formulation: With patient Time For Goal Achievement: 12/19/19 Potential to Achieve Goals: Good  OT Frequency: Min 2X/week   Barriers to D/C:            Co-evaluation              AM-PAC OT "6 Clicks" Daily Activity     Outcome Measure Help from another person eating meals?: None Help from another person taking care of personal grooming?: None Help from another person toileting, which includes using toliet, bedpan, or urinal?: None Help from another person bathing (including washing, rinsing, drying)?: A Little Help from another person to put on and taking off regular upper body clothing?: None Help from another person to put on and taking off regular lower body clothing?: None 6 Click Score: 23   End of Session Equipment Utilized During Treatment: Gait belt;Rolling walker Nurse  Communication: Mobility status;Weight bearing status  Activity Tolerance: Patient tolerated treatment well Patient left: in chair;with call bell/phone within reach  OT Visit Diagnosis: Unsteadiness on feet (R26.81);Other abnormalities of gait and mobility (R26.89);Pain                Time: 9450-3888 OT Time Calculation (min): 18 min Charges:  OT General Charges $OT Visit: 1 Visit OT Evaluation $OT Eval Low Complexity: 1 Low  Marquette Old, MSOT, OTR/L  Supplemental Rehabilitation Services  [phone]   Zigmund Daniel 12/05/2019, 12:41 PM

## 2019-12-05 NOTE — Evaluation (Signed)
Physical Therapy Evaluation Patient Details Name: Jonathan Bass MRN: [id number] DOB: 1961-11-14 Today's Date: 12/05/2019   History of Present Illness  Pt is a 58 y/o male s/p R transtibial amputation. PMH including but not limited to initial injury to R LE occured in March of 2020 with multiple surgeries and therapy.    Clinical Impression  Pt presented supine in bed with HOB elevated, awake and willing to participate in therapy session. Prior to admission, pt reported that he was ambulating with crutches and independent with ADLs. Pt lives with his wife in a two level home (able to live on the main level) with a ramped entrance. At the time of evaluation, pt moving very well at a supervision level with use of RW. PT reviewed importance of maintaining knee extension ROM in preparation for LE prosthesis once healed. Pt expressed understanding and currently with full AROM knee extension. PT will continue to follow pt acutely to progress mobility as tolerated as per PT POC.    Follow Up Recommendations No PT follow up    Equipment Recommendations  None recommended by PT    Recommendations for Other Services       Precautions / Restrictions Precautions Precautions: Fall Precaution Comments: wound VAC Restrictions Weight Bearing Restrictions: No      Mobility  Bed Mobility Overal bed mobility: Modified Independent                Transfers Overall transfer level: Needs assistance Equipment used: Rolling walker (2 wheeled) Transfers: Sit to/from Stand Sit to Stand: Supervision         General transfer comment: good technique, steady with transition  Ambulation/Gait Ambulation/Gait assistance: Min guard Gait Distance (Feet): 10 Feet Assistive device: Rolling walker (2 wheeled) Gait Pattern/deviations: (hop-to on L LE) Gait velocity: decreased   General Gait Details: pt overall steady with RW, able to hop in room without LOB or increased pain; pt reporting that he  prefers crutches  Stairs            Wheelchair Mobility    Modified Rankin (Stroke Patients Only)       Balance Overall balance assessment: Needs assistance Sitting-balance support: Feet supported Sitting balance-Leahy Scale: Good     Standing balance support: Bilateral upper extremity supported;Single extremity supported Standing balance-Leahy Scale: Poor                               Pertinent Vitals/Pain Pain Assessment: Faces Faces Pain Scale: Hurts a little bit Pain Location: R residual limb Pain Descriptors / Indicators: Guarding Pain Intervention(s): Monitored during session;Repositioned    Home Living Family/patient expects to be discharged to:: Private residence Living Arrangements: Spouse/significant other Available Help at Discharge: Family;Available 24 hours/day Type of Home: House Home Access: Ramped entrance     Home Layout: Two level;Able to live on main level with bedroom/bathroom Home Equipment: Dan Humphreys - 2 wheels;Crutches;Bedside commode;Other (comment)(knee scooter)      Prior Function Level of Independence: Independent with assistive device(s)         Comments: mostly uses crutches to mobilize, can bathe himself in seated position     Hand Dominance        Extremity/Trunk Assessment   Upper Extremity Assessment Upper Extremity Assessment: Overall WFL for tasks assessed    Lower Extremity Assessment Lower Extremity Assessment: Overall WFL for tasks assessed;RLE deficits/detail RLE Deficits / Details: wound VAC in place to distal aspect, pt with full  active knee extension ROM    Cervical / Trunk Assessment Cervical / Trunk Assessment: Normal  Communication   Communication: No difficulties  Cognition Arousal/Alertness: Awake/alert Behavior During Therapy: WFL for tasks assessed/performed Overall Cognitive Status: Within Functional Limits for tasks assessed                                         General Comments      Exercises Other Exercises Other Exercises: PT demonstrated and instructed pt in gentle tactile input to distal aspect of residual limb for desensitization   Assessment/Plan    PT Assessment Patient needs continued PT services  PT Problem List Decreased mobility;Pain       PT Treatment Interventions DME instruction;Gait training;Stair training;Functional mobility training;Therapeutic activities;Therapeutic exercise;Neuromuscular re-education;Balance training;Patient/family education    PT Goals (Current goals can be found in the Care Plan section)  Acute Rehab PT Goals Patient Stated Goal: "home on Monday" PT Goal Formulation: With patient Time For Goal Achievement: 12/19/19 Potential to Achieve Goals: Good    Frequency Min 3X/week   Barriers to discharge        Co-evaluation               AM-PAC PT "6 Clicks" Mobility  Outcome Measure Help needed turning from your back to your side while in a flat bed without using bedrails?: None Help needed moving from lying on your back to sitting on the side of a flat bed without using bedrails?: None Help needed moving to and from a bed to a chair (including a wheelchair)?: None Help needed standing up from a chair using your arms (e.g., wheelchair or bedside chair)?: None Help needed to walk in hospital room?: None Help needed climbing 3-5 steps with a railing? : None 6 Click Score: 24    End of Session Equipment Utilized During Treatment: Gait belt Activity Tolerance: Patient tolerated treatment well Patient left: in chair;with call bell/phone within reach Nurse Communication: Mobility status PT Visit Diagnosis: Other abnormalities of gait and mobility (R26.89)    Time: 0981-1914 PT Time Calculation (min) (ACUTE ONLY): 27 min   Charges:   PT Evaluation $PT Eval Moderate Complexity: 1 Mod PT Treatments $Gait Training: 8-22 mins        Anastasio Champion, DPT  Acute Rehabilitation  Services Pager [phone] Office [place] 12/05/2019, 11:22 AM

## 2019-12-06 NOTE — Progress Notes (Signed)
OT Cancellation Note  Patient Details Name: Jonathan Bass MRN: [id number] DOB: 1961/04/25   Cancelled Treatment:    Reason Eval/Treat Not Completed: Patient declined, no reason specified(Pt stating "I feel like I have my transfers down.")   Pt reported that he has been adapting to his new situation in the weeks coming before sx. He explained he has a walk in shower and sits down for all ADL tasks and has been doing well transferring by himself lately. Pt reports that OT was thorough yesterday and he does not feel that he needs further OT. OT signing off.  Jonathan Bass OTR/L Acute Rehabilitation Services Pager: [phone] Office: [phone]   Jonathan Bass 12/06/2019, 9:17 AM

## 2019-12-06 NOTE — TOC Transition Note (Addendum)
Transition of Care [facility]) - CM/SW Discharge Note   Patient Details  Name: WAGNER TANZI MRN: [id number] Date of Birth: 08-Dec-1961  [facility] CM/SW Contact:  Claudie Leach, RN 12/06/2019, 4:15 PM   Clinical Narrative:    Pt planning to dc home tomorrow.  Patient states he does not need HH therapy or DME other than a 3n1.  Patient received a 3n1 previously but it rusted from using in the shower so he needs a new one.  3n1 ordered to be delivered to room prior to d/c.   No HH therapy recommended.  Patient has [employer] Wound Vac.    Final next level of care: Home/Self Care Barriers to Discharge: No Barriers Identified   Discharge Plan and Services                DME Arranged: 3-N-1 DME Agency: [employer] Date DME Agency Contacted: 12/06/19 Time DME Agency Contacted: [phone] Representative spoke with at DME Agency: Bertrum Sol   3n1 will be delivered Monday.

## 2019-12-07 MED ORDER — OXYCODONE HCL 5 MG PO TABS: 5.0000 mg | ORAL_TABLET | ORAL | 0 refills | Status: DC | PRN

## 2019-12-07 MED ORDER — METHOCARBAMOL 500 MG PO TABS: 500.0000 mg | ORAL_TABLET | Freq: Four times a day (QID) | ORAL | 1 refills | Status: DC | PRN

## 2019-12-07 MED ORDER — GABAPENTIN 300 MG PO CAPS: 300.0000 mg | ORAL_CAPSULE | Freq: Three times a day (TID) | ORAL | 1 refills | Status: DC

## 2019-12-07 NOTE — Discharge Summary (Signed)
Discharge Diagnoses:  Active Problems:   Subacute osteomyelitis, right ankle and foot (HCC)   Gangrene of right foot (HCC)   Surgeries: Procedure(s): RIGHT BELOW KNEE AMPUTATION on 12/04/2019    Consultants:   Discharged Condition: Improved  Hospital Course: Jonathan Bass is an 58 y.o. male who was admitted 12/04/2019 with a chief complaint of right ankle pain with a final diagnosis of right pilon fracture.  Patient was brought to the operating room on 12/04/2019 and underwent Procedure(s): RIGHT BELOW KNEE AMPUTATION.    Patient was given perioperative antibiotics:  Anti-infectives (From admission, onward)   Start     Dose/Rate Route Frequency Ordered Stop   12/04/19 1600  ceFAZolin (ANCEF) IVPB 2g/100 mL premix     2 g 200 mL/hr over 30 Minutes Intravenous Every 6 hours 12/04/19 1452 12/05/19 0432   12/04/19 0839  ceFAZolin (ANCEF) 2-4 GM/100ML-% IVPB    Note to Pharmacy: Block, Sarah   : cabinet override      12/04/19 0839 12/04/19 2044   12/04/19 0830  ceFAZolin (ANCEF) IVPB 2g/100 mL premix  Status:  Discontinued     2 g 200 mL/hr over 30 Minutes Intravenous On call to O.R. 12/04/19 8144 12/04/19 1423    .  Patient was given sequential compression devices, early ambulation, and aspirin for DVT prophylaxis.  Recent vital signs:  Patient Vitals for the past 24 hrs:  BP Temp Temp src Pulse Resp SpO2  12/07/19 0429 117/75 98.7 F (37.1 C) Oral 70 14 95 %  12/06/19 2151 (!) 158/91 98.4 F (36.9 C) Oral 70 -- 99 %  12/06/19 1422 (!) 144/93 98.5 F (36.9 C) Oral 68 16 99 %  12/06/19 0830 (!) 136/92 98 F (36.7 C) Oral 60 17 100 %  .  Recent laboratory studies: No results found.  Discharge Medications:   Allergies as of 12/07/2019   No Known Allergies     Medication List    TAKE these medications   acetaminophen 650 MG CR tablet Commonly known as: TYLENOL Take 650 mg by mouth every 8 (eight) hours as needed for pain.   ascorbic acid 500 MG  tablet Commonly known as: VITAMIN C Take 1 tablet (500 mg total) by mouth daily.   aspirin EC 81 MG tablet Take 81 mg by mouth daily.   FISH OIL PO Take 1 capsule by mouth daily.   gabapentin 300 MG capsule Commonly known as: NEURONTIN Take 1 capsule (300 mg total) by mouth 3 (three) times daily.   methocarbamol 500 MG tablet Commonly known as: ROBAXIN Take 1 tablet (500 mg total) by mouth every 6 (six) hours as needed for muscle spasms.   multivitamin with minerals Tabs tablet Take 1 tablet by mouth daily.   oxyCODONE 5 MG immediate release tablet Commonly known as: Oxy IR/ROXICODONE Take 1-2 tablets (5-10 mg total) by mouth every 4 (four) hours as needed for moderate pain (pain score 4-6).   propranolol 10 MG tablet Commonly known as: INDERAL Take 10 mg by mouth 2 (two) times daily as needed (anxiety).   sertraline 100 MG tablet Commonly known as: ZOLOFT Take 100 mg by mouth daily.   Vitamin D3 125 MCG (5000 UT) Tabs Take 1 tablet (5,000 Units total) by mouth daily.            Durable Medical Equipment  (From admission, onward)         Start     Ordered   12/07/19 0732  For home use only  DME 3 n 1  Once     12/07/19 0732   12/06/19 1613  For home use only DME 3 n 1  Once     12/06/19 1613          Diagnostic Studies: XR Ankle Complete Right  Result Date: 11/30/2019 Three-view radiographs of the right ankle shows stable callus formation with a noncongruent tibial talar joint.   Patient benefited maximally from their hospital stay and there were no complications.     Disposition: Discharge disposition: 01-Home or Self Care      Discharge Instructions    Call MD / Call 911   Complete by: As directed    If you experience chest pain or shortness of breath, CALL 911 and be transported to the hospital emergency room.  If you develope a fever above 101 F, pus (white drainage) or increased drainage or redness at the wound, or calf pain, call your  surgeon's office.   Constipation Prevention   Complete by: As directed    Drink plenty of fluids.  Prune juice may be helpful.  You may use a stool softener, such as Colace (over the counter) 100 mg twice a day.  Use MiraLax (over the counter) for constipation as needed.   Diet - low sodium heart healthy   Complete by: As directed    Discharge instructions   Complete by: As directed    Keep Dressing dry.   Increase activity slowly as tolerated   Complete by: As directed    Neg Press Wound Therapy / Incisional   Complete by: As directed    Show patient how to attach prevena vac   Neg Press Wound Therapy / Incisional   Complete by: As directed    Show patient how to attach prevena pump     Follow-up Information    Newt Minion, MD In 1 week.   Specialty: Orthopedic Surgery Contact information: [address] [place] [address] [phone]            Signed: Bevely Palmer Laiya Wisby 12/07/2019, 7:39 AM

## 2019-12-07 NOTE — Progress Notes (Addendum)
A discharge packet has been printed for the patient.  The patient verbalizes understanding discharge instructions.  The patient was provided with prevenna information booklet.  The patient will be discharged to his home with his wife.

## 2019-12-07 NOTE — Progress Notes (Signed)
POD 3 BKA . Doing well. Would like to be discharged today.  VSS afebrile. 0 cc in VAC. Will order DME Home today

## 2019-12-07 NOTE — Progress Notes (Signed)
Physical Therapy is at the bedside for therapy session and step session.  Prevenna wound vac has been assembled and charging for patient use when he is discharged.

## 2019-12-07 NOTE — Progress Notes (Signed)
Physical Therapy Treatment Patient Details Name: Jonathan Bass MRN: [id number] DOB: [birth year] Today's Date: 12/07/2019    History of Present Illness Pt is a 58 y/o male s/p R transtibial amputation. PMH including but not limited to initial injury to R LE occured in March of 2020 with multiple surgeries and therapy.    PT Comments    Continuing work on functional mobility and activity tolerance;  Session focused on gait training with crutches including stair training, and therapeutic exercises in prep for prosthesis; Very steady in crutches, and good execution of therex; OK for dc home from PT standpoint    Follow Up Recommendations  No PT follow up(Oupt PT for prosthesis training when appropriate)     Equipment Recommendations  None recommended by PT    Recommendations for Other Services       Precautions / Restrictions Precautions Precautions: Fall Precaution Comments: wound VAC    Mobility  Bed Mobility Overal bed mobility: Modified Independent             General bed mobility comments: including rolling into and out of prone  Transfers Overall transfer level: Needs assistance Equipment used: Crutches Transfers: Sit to/from Stand Sit to Stand: Supervision         General transfer comment: good technique, steady with transition  Ambulation/Gait Ambulation/Gait assistance: Supervision Gait Distance (Feet): 200 Feet Assistive device: Crutches Gait Pattern/deviations: Step-through pattern Gait velocity: decreased   General Gait Details: Steady steps; it is clear that he has had lots of practice on crutches NWB   Stairs Stairs: Yes Stairs assistance: Min guard Stair Management: No rails;With crutches;Forwards;Step to pattern Number of Stairs: 2 General stair comments: Cues for technqiue; overall managed well   Wheelchair Mobility    Modified Rankin (Stroke Patients Only)       Balance             Standing balance-Leahy Scale: Fair                               Cognition Arousal/Alertness: Awake/alert Behavior During Therapy: WFL for tasks assessed/performed Overall Cognitive Status: Within Functional Limits for tasks assessed                                        Exercises Other Exercises Other Exercises: Prone alternating hip extension x 20 Other Exercises: Sidelying hip abduction R xt0 Other Exercises: Sidelying hip extension R x10 Other Exercises: Bolstered half bridge and full bridge x 10 each    General Comments        Pertinent Vitals/Pain Pain Assessment: No/denies pain    Home Living                      Prior Function            PT Goals (current goals can now be found in the care plan section) Acute Rehab PT Goals Patient Stated Goal: "home on Monday" PT Goal Formulation: With patient Time For Goal Achievement: 12/19/19 Potential to Achieve Goals: Good Progress towards PT goals: Progressing toward goals    Frequency    Min 3X/week      PT Plan Current plan remains appropriate    Co-evaluation              AM-PAC PT "6 Clicks" Mobility   Outcome Measure  Help needed turning from your back to your side while in a flat bed without using bedrails?: None Help needed moving from lying on your back to sitting on the side of a flat bed without using bedrails?: None Help needed moving to and from a bed to a chair (including a wheelchair)?: None Help needed standing up from a chair using your arms (e.g., wheelchair or bedside chair)?: None Help needed to walk in hospital room?: None Help needed climbing 3-5 steps with a railing? : A Little 6 Click Score: 23    End of Session Equipment Utilized During Treatment: Gait belt Activity Tolerance: Patient tolerated treatment well Patient left: in chair;with call bell/phone within reach Nurse Communication: Mobility status(OK for dc from PT point of view) PT Visit Diagnosis: Other abnormalities  of gait and mobility (R26.89)     Time: 6222-9798 PT Time Calculation (min) (ACUTE ONLY): 40 min  Charges:  $Gait Training: 23-37 mins $Therapeutic Exercise: 8-22 mins                     Van Clines, PT  Acute Rehabilitation Services Pager [phone] Office [phone]    Levi Aland 12/07/2019, 12:57 PM

## 2019-12-08 ENCOUNTER — Ambulatory Visit: Payer: 59 | Admitting: Physical Therapy

## 2019-12-13 ENCOUNTER — Encounter: Payer: Self-pay | Admitting: Orthopedic Surgery

## 2019-12-14 ENCOUNTER — Ambulatory Visit (INDEPENDENT_AMBULATORY_CARE_PROVIDER_SITE_OTHER): Payer: 59 | Admitting: Physician Assistant

## 2019-12-14 ENCOUNTER — Other Ambulatory Visit: Payer: Self-pay

## 2019-12-14 ENCOUNTER — Encounter: Payer: Self-pay | Admitting: Physician Assistant

## 2019-12-14 VITALS — Ht 71.0 in | Wt 203.0 lb

## 2019-12-14 DIAGNOSIS — S82871S Displaced pilon fracture of right tibia, sequela: Secondary | ICD-10-CM

## 2019-12-14 NOTE — Progress Notes (Signed)
Office Visit Note   Patient: Jonathan Bass           Date of Birth: April 12, 1961           MRN: [id number] Visit Date: 12/14/2019              Requested by: Juluis RainierBarnes, Elizabeth, MD [address] [place],  [place] [address] PCP: Juluis RainierBarnes, Elizabeth, MD  Chief Complaint  Patient presents with  . Right Leg - Routine Post Op    12/04/2019 right BKA      HPI: The patient is 10 days status post right below-knee amputation he reports he is doing well he is here for a wound check and to remove his wound VAC  Assessment & Plan: Visit Diagnoses: No diagnosis found.  Plan: He will use his stump shrinker he may bathe with gentle soap and water he will follow up in 1 week  Follow-Up Instructions: No follow-ups on file.   Ortho Exam  Patient is alert, oriented, no adenopathy, well-dressed, normal affect, normal respiratory effort. Focused examination demonstrates healing surgical incision he has some mild bloody drainage but wound edges are healthy and approximated and swelling is well controlled no surrounding cellulitis  Imaging: No results found. No images are attached to the encounter.  Labs: Lab Results  Component Value Date   ESRSEDRATE 6 08/25/2019   CRP 7.5 08/25/2019   REPTSTATUS 06/17/2019 FINAL 06/12/2019   GRAMSTAIN  06/12/2019    RARE WBC PRESENT, PREDOMINANTLY PMN NO ORGANISMS SEEN    CULT  06/12/2019    RARE PSEUDOMONAS PUTIDA NO ANAEROBES ISOLATED CRITICAL RESULT CALLED TO, READ BACK BY AND VERIFIED WITH: RN Gaynelle CageSABRINA S  [phone] AT 1315 BY CM Performed at [facility], [address] [place], [place] [address]    LABORGA PSEUDOMONAS PUTIDA 06/12/2019     Lab Results  Component Value Date   ALBUMIN 3.3 (L) 06/14/2019   ALBUMIN 4.0 03/11/2019   ALBUMIN 4.0 01/19/2012    No results found for: MG No results found for: VD25OH  No results found for: PREALBUMIN CBC EXTENDED Latest Ref Rng & Units 12/04/2019 06/10/2019 05/08/2019  WBC 4.0 - 10.5 K/uL  6.1 - 6.4  RBC 4.22 - 5.81 MIL/uL 5.29 - 4.61  HGB 13.0 - 17.0 g/dL 52.815.3 41.314.7 24.413.7  HCT 01.039.0 - 52.0 % 46.7 - 41.9  PLT 150 - 400 K/uL 225 - 238  NEUTROABS 1.7 - 7.7 K/uL - - -  LYMPHSABS 0.7 - 4.0 K/uL - - -     Body mass index is 28.32 kg/m.  Orders:  No orders of the defined types were placed in this encounter.  No orders of the defined types were placed in this encounter.    Procedures: No procedures performed  Clinical Data: No additional findings.  ROS:  All other systems negative, except as noted in the HPI. Review of Systems  Objective: Vital Signs: Ht 5\' 11"  (1.803 m)   Wt 203 lb 0.6 oz (92.1 kg)   BMI 28.32 kg/m   Specialty Comments:  No specialty comments available.  PMFS History: Patient Active Problem List   Diagnosis Date Noted  . 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 Subacute osteomyelitis, right ankle and foot (HCC)   . Pilon fracture of right tibia, sequela 06/10/2019  . Displaced pilon fracture of right tibia, initial encounter for open fracture type IIIA, IIIB, or IIIC   . Subacute osteomyelitis of right tibia (HCC)   . Surgery,  elective   . Anxiety   . Open pilon fracture, right, type III, initial encounter 03/11/2019  . Closed fracture of right distal radius 03/11/2019  . Type III open pilon fracture of right tibia 03/11/2019  . Pounding heartbeat 01/21/2019  . Precordial chest pain 03/05/2014   Past Medical History:  Diagnosis Date  . Anxiety   . GERD (gastroesophageal reflux disease)    02/21/2019- not current   . Pulmonary nodule 10/2014   Noted on CT scan -> stable and follow-up 2019.  No further evaluation necessary.    Family History  Problem Relation Age of Onset  . Prostate cancer Father   . Valvular heart disease Father        Had a heart valve replaced (unsure of details)  . Breast cancer Mother   . Parkinson's disease Mother   . Healthy Sister   . Healthy Brother   . Parkinson's disease Maternal Grandmother   .  Diabetes Maternal Grandfather     Past Surgical History:  Procedure Laterality Date  . AMPUTATION Right 12/04/2019   Procedure: RIGHT BELOW KNEE AMPUTATION;  Surgeon: Newt Minion, MD;  Location: [facility];  Service: Orthopedics;  Laterality: Right;  . APPLICATION OF WOUND VAC Right 06/10/2019   Procedure: Application Of Wound Vac -Prevena;  Surgeon: Newt Minion, MD;  Location: [facility];  Service: Orthopedics;  Laterality: Right;  . CARDIAC EVENT MONITOR  01/2019   redominantly NSR with average rate 60 bpm.  One short 4 beat run of NSVT.  Minimal PACs and PVCs noted.  No sustained arrhythmia.  Marland Kitchen CLOSED REDUCTION WRIST FRACTURE Right 03/11/2019   Procedure: CLOSED REDUCTION WRIST;  Surgeon: Shona Needles, MD;  Location: [facility];  Service: Orthopedics;  Laterality: Right;  . COLONOSCOPY    . EXTERNAL FIXATION LEG Right 03/11/2019   Procedure: EXTERNAL FIXATION LEG;  Surgeon: Shona Needles, MD;  Location: [facility];  Service: Orthopedics;  Laterality: Right;  . EXTERNAL FIXATION REMOVAL Right 05/08/2019   Procedure: REMOVAL EXTERNAL FIXATION ANKLE, PLACEMENT OF CAST;  Surgeon: Shona Needles, MD;  Location: [facility];  Service: Orthopedics;  Laterality: Right;  . FOOT SURGERY Bilateral    hammer toe  . HERNIA REPAIR Bilateral 2009   Inguinial  . I & D EXTREMITY Right 03/11/2019   Procedure: IRRIGATION AND DEBRIDEMENT EXTREMITY;  Surgeon: Shona Needles, MD;  Location: [facility];  Service: Orthopedics;  Laterality: Right;  . IR FLUORO GUIDE CV LINE RIGHT  07/09/2019  . OPEN REDUCTION INTERNAL FIXATION (ORIF) TIBIA/FIBULA FRACTURE Right 06/10/2019   Procedure: EXCISION RIGHT TIBIA AND FIBULA, WOUND DEBRIDEMENT, AND FIXATION OF TIBIA AND FIBULA;  Surgeon: Newt Minion, MD;  Location: [facility];  Service: Orthopedics;  Laterality: Right;  . OPEN REDUCTION INTERNAL FIXATION (ORIF) TIBIA/FIBULA FRACTURE Right 06/12/2019   Procedure: DEBRIDEMENT AND REVISION INTERNAL FIXATION INFECTED RIGHT PILON FRACTURE;  Surgeon:  Newt Minion, MD;  Location: [facility];  Service: Orthopedics;  Laterality: Right;  . ORIF ANKLE FRACTURE Right 03/11/2019   Procedure: OPEN REDUCTION INTERNAL FIXATION (ORIF) ANKLE FRACTURE;  Surgeon: Shona Needles, MD;  Location: [facility];  Service: Orthopedics;  Laterality: Right;  . ORIF WRIST FRACTURE Right 03/11/2019   Procedure: OPEN REDUCTION INTERNAL FIXATION (ORIF) WRIST FRACTURE;  Surgeon: Shona Needles, MD;  Location: [facility];  Service: Orthopedics;  Laterality: Right;  . TONSILLECTOMY    . TRANSTHORACIC ECHOCARDIOGRAM  01/2019   EF 55 to 60%.  Normal wall motion.  Indeterminate likely grade 1 diastolic function.  Mild degenerative mitral valve disease with thickening but no prolapse.   Social History   Occupational History  . Occupation: Production designer, theatre/television/film at [employer]: [employer]  Tobacco Use  . Smoking status: Never Smoker  . Smokeless tobacco: Never Used  Substance and Sexual Activity  . Alcohol use: No  . Drug use: No  . Sexual activity: Not on file

## 2019-12-15 ENCOUNTER — Encounter: Payer: 59 | Admitting: Physical Therapy

## 2019-12-15 LAB — SURGICAL PATHOLOGY

## 2019-12-16 ENCOUNTER — Encounter: Payer: Self-pay | Admitting: Orthopedic Surgery

## 2019-12-17 ENCOUNTER — Telehealth: Payer: Self-pay | Admitting: Orthopedic Surgery

## 2019-12-17 ENCOUNTER — Other Ambulatory Visit: Payer: Self-pay | Admitting: Orthopedic Surgery

## 2019-12-17 ENCOUNTER — Other Ambulatory Visit: Payer: Self-pay | Admitting: Physician Assistant

## 2019-12-17 MED ORDER — METHOCARBAMOL 500 MG PO TABS: 500.0000 mg | ORAL_TABLET | Freq: Four times a day (QID) | ORAL | 1 refills | Status: DC | PRN

## 2019-12-17 MED ORDER — OXYCODONE HCL 5 MG PO TABS: 5.0000 mg | ORAL_TABLET | ORAL | 0 refills | Status: DC | PRN

## 2019-12-17 NOTE — Telephone Encounter (Signed)
Please advise, thanks.

## 2019-12-17 NOTE — Telephone Encounter (Signed)
Oxycodone and robaxin called in

## 2019-12-17 NOTE — Telephone Encounter (Signed)
Thank you, noted.

## 2019-12-17 NOTE — Telephone Encounter (Signed)
Patient called. He needs his pain meds and muscle relaxing meds refilled.   Call back number: [phone]

## 2019-12-20 ENCOUNTER — Encounter: Payer: Self-pay | Admitting: Orthopedic Surgery

## 2019-12-21 ENCOUNTER — Encounter: Payer: Self-pay | Admitting: Physician Assistant

## 2019-12-21 ENCOUNTER — Ambulatory Visit (INDEPENDENT_AMBULATORY_CARE_PROVIDER_SITE_OTHER): Payer: BC Managed Care – PPO | Admitting: Orthopedic Surgery

## 2019-12-21 ENCOUNTER — Other Ambulatory Visit: Payer: Self-pay

## 2019-12-21 ENCOUNTER — Encounter: Payer: Self-pay | Admitting: Orthopedic Surgery

## 2019-12-21 VITALS — Ht 71.0 in | Wt 203.0 lb

## 2019-12-21 DIAGNOSIS — Z89511 Acquired absence of right leg below knee: Secondary | ICD-10-CM

## 2019-12-21 NOTE — Progress Notes (Signed)
Office Visit Note   Patient: Jonathan Bass           Date of Birth: 1961/10/07           MRN: [id number] Visit Date: 12/21/2019              Requested by: Juluis Rainier, MD [address] [place],  [place] [address] PCP: Juluis Rainier, MD  Chief Complaint  Patient presents with  . Right Leg - Routine Post Op    12/04/19 right BKA       HPI: Patient is a 59 year old gentleman who presents 2-1/2 weeks status post right transtibial amputation.  Patient states he still has phantom pain he is using a 4X stump shrinker he is working with biotech for prosthetic fitting he is currently on Neurontin Robaxin and Percocet.  Assessment & Plan: Visit Diagnoses:  1. Acquired absence of right leg below knee [facility])     Plan: Patient is given a prescription for biotech for K3 level prosthesis he will need an extra-large stump shrinker not the for extra-large.  Anticipate that he could be casted for prosthetic in 2 to 3 weeks.  Follow-Up Instructions: Return in about 3 weeks (around 01/11/2020).   Ortho Exam  Patient is alert, oriented, no adenopathy, well-dressed, normal affect, normal respiratory effort. Examination patient has a well consolidated incision the staples are removed.  There is no redness no cellulitis no drainage she does have swelling.  His thigh measures 44 cm for a size extra-large stump shrinker.  Patient is a new right transtibial  amputee.  Patient's current comorbidities are not expected to impact the ability to function with the prescribed prosthesis. Patient verbally communicates a strong desire to use a prosthesis. Patient currently requires mobility aids to ambulate without a prosthesis.  Expects not to use mobility aids with a new prosthesis.  Patient is a K3 level ambulator that spends a lot of time walking around on uneven terrain over obstacles, up and down stairs, and ambulates with a variable cadence.    Imaging: No results found. No images  are attached to the encounter.  Labs: Lab Results  Component Value Date   ESRSEDRATE 6 08/25/2019   CRP 7.5 08/25/2019   REPTSTATUS 06/17/2019 FINAL 06/12/2019   GRAMSTAIN  06/12/2019    RARE WBC PRESENT, PREDOMINANTLY PMN NO ORGANISMS SEEN    CULT  06/12/2019    RARE PSEUDOMONAS PUTIDA NO ANAEROBES ISOLATED CRITICAL RESULT CALLED TO, READ BACK BY AND VERIFIED WITH: RN Gaynelle Cage  [id number] AT 1315 BY CM Performed at [facility], [address] [place], [place] [address]    LABORGA PSEUDOMONAS PUTIDA 06/12/2019     Lab Results  Component Value Date   ALBUMIN 3.3 (L) 06/14/2019   ALBUMIN 4.0 03/11/2019   ALBUMIN 4.0 01/19/2012    No results found for: MG No results found for: VD25OH  No results found for: PREALBUMIN CBC EXTENDED Latest Ref Rng & Units 12/04/2019 06/10/2019 05/08/2019  WBC 4.0 - 10.5 K/uL 6.1 - 6.4  RBC 4.22 - 5.81 MIL/uL 5.29 - 4.61  HGB 13.0 - 17.0 g/dL 86.7 61.9 50.9  HCT 32.6 - 52.0 % 46.7 - 41.9  PLT 150 - 400 K/uL 225 - 238  NEUTROABS 1.7 - 7.7 K/uL - - -  LYMPHSABS 0.7 - 4.0 K/uL - - -     Body mass index is 28.31 kg/m.  Orders:  No orders of the defined types were placed in this encounter.  No orders of the defined types were placed in this encounter.    Procedures: No procedures performed  Clinical Data: No additional findings.  ROS:  All other systems negative, except as noted in the HPI. Review of Systems  Objective: Vital Signs: Ht 5\' 11"  (1.803 m)   Wt 203 lb (92.1 kg)   BMI 28.31 kg/m   Specialty Comments:  No specialty comments available.  PMFS History: Patient Active Problem List   Diagnosis Date Noted  . Gangrene of right foot ([place]) 12/04/2019  . Subacute osteomyelitis, right ankle and foot ([place])   . Pilon fracture of right tibia, sequela 06/10/2019  . Displaced pilon fracture of right tibia, initial encounter for open fracture type IIIA, IIIB, or IIIC   . Subacute osteomyelitis of right tibia ([place])   .  Surgery, elective   . Anxiety   . Open pilon fracture, right, type III, initial encounter 03/11/2019  . Closed fracture of right distal radius 03/11/2019  . Type III open pilon fracture of right tibia 03/11/2019  . Pounding heartbeat 01/21/2019  . Precordial chest pain 03/05/2014   Past Medical History:  Diagnosis Date  . Anxiety   . GERD (gastroesophageal reflux disease)    02/21/2019- not current   . Pulmonary nodule 10/2014   Noted on CT scan -> stable and follow-up 2019.  No further evaluation necessary.    Family History  Problem Relation Age of Onset  . Prostate cancer Father   . Valvular heart disease Father        Had a heart valve replaced (unsure of details)  . Breast cancer Mother   . Parkinson's disease Mother   . Healthy Sister   . Healthy Brother   . Parkinson's disease Maternal Grandmother   . Diabetes Maternal Grandfather     Past Surgical History:  Procedure Laterality Date  . AMPUTATION Right 12/04/2019   Procedure: RIGHT BELOW KNEE AMPUTATION;  Surgeon: Newt Minion, MD;  Location: [place];  Service: Orthopedics;  Laterality: Right;  . APPLICATION OF WOUND VAC Right 06/10/2019   Procedure: Application Of Wound Vac -Prevena;  Surgeon: Newt Minion, MD;  Location: [place];  Service: Orthopedics;  Laterality: Right;  . CARDIAC EVENT MONITOR  01/2019   redominantly NSR with average rate 60 bpm.  One short 4 beat run of NSVT.  Minimal PACs and PVCs noted.  No sustained arrhythmia.  Marland Kitchen CLOSED REDUCTION WRIST FRACTURE Right 03/11/2019   Procedure: CLOSED REDUCTION WRIST;  Surgeon: Shona Needles, MD;  Location: [place];  Service: Orthopedics;  Laterality: Right;  . COLONOSCOPY    . EXTERNAL FIXATION LEG Right 03/11/2019   Procedure: EXTERNAL FIXATION LEG;  Surgeon: Shona Needles, MD;  Location: [place];  Service: Orthopedics;  Laterality: Right;  . EXTERNAL FIXATION REMOVAL Right 05/08/2019   Procedure: REMOVAL EXTERNAL FIXATION ANKLE, PLACEMENT OF CAST;  Surgeon:  Shona Needles, MD;  Location: [place];  Service: Orthopedics;  Laterality: Right;  . FOOT SURGERY Bilateral    hammer toe  . HERNIA REPAIR Bilateral 2009   Inguinial  . I & D EXTREMITY Right 03/11/2019   Procedure: IRRIGATION AND DEBRIDEMENT EXTREMITY;  Surgeon: Shona Needles, MD;  Location: [place];  Service: Orthopedics;  Laterality: Right;  . IR FLUORO GUIDE CV LINE RIGHT  07/09/2019  . OPEN REDUCTION INTERNAL FIXATION (ORIF) TIBIA/FIBULA FRACTURE Right 06/10/2019   Procedure: EXCISION RIGHT TIBIA AND FIBULA, WOUND DEBRIDEMENT, AND FIXATION OF TIBIA AND FIBULA;  Surgeon: Sharol Given,  Randa Evens, MD;  [facility];  Service: Orthopedics;  Laterality: Right;  . OPEN REDUCTION INTERNAL FIXATION (ORIF) TIBIA/FIBULA FRACTURE Right 06/12/2019   Procedure: DEBRIDEMENT AND REVISION INTERNAL FIXATION INFECTED RIGHT PILON FRACTURE;  Surgeon: Nadara Mustard, MD;  Location: [facility] OR;  Service: Orthopedics;  Laterality: Right;  . ORIF ANKLE FRACTURE Right 03/11/2019   Procedure: OPEN REDUCTION INTERNAL FIXATION (ORIF) ANKLE FRACTURE;  Surgeon: [name], MD;  [facility];  Service: Orthopedics;  Laterality: Right;  . ORIF WRIST FRACTURE Right 03/11/2019   Procedure: OPEN REDUCTION INTERNAL FIXATION (ORIF) WRIST FRACTURE;  Surgeon: [name], MD;  [facility];  Service: Orthopedics;  Laterality: Right;  . TONSILLECTOMY    . TRANSTHORACIC ECHOCARDIOGRAM  01/2019   EF 55 to 60%.  Normal wall motion.  Indeterminate likely grade 1 diastolic function.  Mild degenerative mitral valve disease with thickening but no prolapse.   Social History   Occupational History  . Occupation: Production designer, theatre/television/film at [employer]: [employer]  Tobacco Use  . Smoking status: Never Smoker  . Smokeless tobacco: Never Used  Substance and Sexual Activity  . Alcohol use: No  . Drug use: No  . Sexual activity: Not on file

## 2019-12-22 ENCOUNTER — Encounter: Payer: Self-pay | Admitting: Orthopedic Surgery

## 2019-12-22 ENCOUNTER — Other Ambulatory Visit: Payer: Self-pay | Admitting: Orthopedic Surgery

## 2019-12-22 MED ORDER — OXYCODONE HCL 5 MG PO TABS: 5.0000 mg | ORAL_TABLET | Freq: Four times a day (QID) | ORAL | 0 refills | Status: DC | PRN

## 2019-12-24 ENCOUNTER — Other Ambulatory Visit: Payer: Self-pay

## 2019-12-24 ENCOUNTER — Encounter: Payer: Self-pay | Admitting: Orthopedic Surgery

## 2019-12-24 ENCOUNTER — Ambulatory Visit: Payer: BC Managed Care – PPO

## 2019-12-25 ENCOUNTER — Other Ambulatory Visit: Payer: Self-pay

## 2019-12-25 ENCOUNTER — Encounter: Payer: Self-pay | Admitting: Orthopedic Surgery

## 2019-12-25 ENCOUNTER — Emergency Department (HOSPITAL_COMMUNITY)
Admission: EM | Admit: 2019-12-25 | Discharge: 2019-12-25 | Disposition: A | Discharge status: Home / Self Care | Payer: BC Managed Care – PPO | Attending: Emergency Medicine | Admitting: Emergency Medicine

## 2019-12-25 ENCOUNTER — Encounter (HOSPITAL_COMMUNITY): Payer: Self-pay | Admitting: Emergency Medicine

## 2019-12-25 DIAGNOSIS — Z7982 Long term (current) use of aspirin: Secondary | ICD-10-CM | POA: Insufficient documentation

## 2019-12-25 DIAGNOSIS — Z89511 Acquired absence of right leg below knee: Secondary | ICD-10-CM | POA: Insufficient documentation

## 2019-12-25 DIAGNOSIS — M79602 Pain in left arm: Secondary | ICD-10-CM | POA: Diagnosis not present

## 2019-12-25 DIAGNOSIS — B029 Zoster without complications: Secondary | ICD-10-CM | POA: Diagnosis not present

## 2019-12-25 DIAGNOSIS — Z79899 Other long term (current) drug therapy: Secondary | ICD-10-CM | POA: Diagnosis not present

## 2019-12-25 MED ORDER — VALACYCLOVIR HCL 1 G PO TABS: 1000.0000 mg | ORAL_TABLET | Freq: Three times a day (TID) | ORAL | 0 refills | Status: AC

## 2019-12-25 MED ORDER — VALACYCLOVIR HCL 500 MG PO TABS
1000.0000 mg | ORAL_TABLET | Freq: Once | ORAL | Status: AC
  Administered 2019-12-25: 1000 mg via ORAL
  Filled 2019-12-25: qty 2

## 2019-12-25 MED ORDER — OXYCODONE HCL 5 MG PO TABS
5.0000 mg | ORAL_TABLET | Freq: Once | ORAL | Status: AC
  Administered 2019-12-25: 5 mg via ORAL
  Filled 2019-12-25: qty 1

## 2019-12-25 NOTE — ED Triage Notes (Addendum)
Patient reports left arm pain onset 2 days ago , he suspects injury from using crutches ,  S/P right BKA  last 12/04/19. No loss of sensation or strenght at affected arm .

## 2019-12-25 NOTE — ED Notes (Signed)
Patient verbalizes understanding of discharge instructions. Opportunity for questioning and answers were provided. Armband removed by staff, pt discharged from ED. Wheeled self out to lobby with scooter

## 2019-12-25 NOTE — Discharge Instructions (Addendum)
Take the medication, valacyclovir, every 8 hours until gone. Continue taking the gabapentin as prescribed. Continue taking the oxycodone as prescribed. You may take an additional dose of this if needed. Follow up closely with your primary care provider regarding your visit today. Return to the ER if you develop high fever, pus draining from your rash, or new or worsening symptoms.

## 2019-12-25 NOTE — ED Provider Notes (Signed)
[facility] EMERGENCY DEPARTMENT Provider Note   CSN: [id number] Arrival date & time: 12/25/19  3299     History Chief Complaint  Patient presents with  . Arm Pain    Jonathan Bass is a 59 y.o. 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 Lajoyce Corners due to osteomyelitis and gangrene.  Patient presenting with 2 days of atraumatic left arm pain.  Patient states he has pain radiating from his right anterior shoulder down through his bicep region.  He is a constant ache with intermittent sharp shooting pains.  Times are not necessarily made worse by particular movements.  He denies neck pain.  Denies numbness or weakness in his extremity.  Denies associated chest pain or shortness of breath, nausea, diaphoresis, fever.  He wonders if his symptoms are secondary to using crutches after his recent right BKA.  He states he applied a BenGay cream to his arm yesterday for symptoms.  The history is provided by the patient.       Past Medical History:  Diagnosis Date  . Anxiety   . GERD (gastroesophageal reflux disease)    02/21/2019- not current   . Pulmonary nodule 10/2014   Noted on CT scan -> stable and follow-up 2019.  No further evaluation necessary.    Patient Active Problem List   Diagnosis Date Noted  . 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 Subacute osteomyelitis, right ankle and foot ([facility])   . Pilon fracture of right tibia, sequela 06/10/2019  . Displaced pilon fracture of right tibia, initial encounter for open fracture type IIIA, IIIB, or IIIC   . Subacute osteomyelitis of right tibia ([facility])   . Surgery, elective   . Anxiety   . Open pilon fracture, right, type III, initial encounter 03/11/2019  . Closed fracture of right distal radius 03/11/2019  . Type III open pilon fracture of right tibia 03/11/2019  . Pounding heartbeat 01/21/2019  . Precordial chest pain 03/05/2014    Past Surgical History:  Procedure Laterality Date  . AMPUTATION Right  12/04/2019   Procedure: RIGHT BELOW KNEE AMPUTATION;  Surgeon: Nadara Mustard, MD;  [facility] OR;  Service: Orthopedics;  Laterality: Right;  . APPLICATION OF WOUND VAC Right 06/10/2019   Procedure: Application Of Wound Vac -Prevena;  Surgeon: Nadara Mustard, MD;  Location: [facility] OR;  Service: Orthopedics;  Laterality: Right;  . CARDIAC EVENT MONITOR  01/2019   redominantly NSR with average rate 60 bpm.  One short 4 beat run of NSVT.  Minimal PACs and PVCs noted.  No sustained arrhythmia.  Marland Kitchen CLOSED REDUCTION WRIST FRACTURE Right 03/11/2019   Procedure: CLOSED REDUCTION WRIST;  Surgeon: [name], MD;  [facility];  Service: Orthopedics;  Laterality: Right;  . COLONOSCOPY    . EXTERNAL FIXATION LEG Right 03/11/2019   Procedure: EXTERNAL FIXATION LEG;  Surgeon: [name], MD;  [facility];  Service: Orthopedics;  Laterality: Right;  . EXTERNAL FIXATION REMOVAL Right 05/08/2019   Procedure: REMOVAL EXTERNAL FIXATION ANKLE, PLACEMENT OF CAST;  Surgeon: [name], MD;  [facility];  Service: Orthopedics;  Laterality: Right;  . FOOT SURGERY Bilateral    hammer toe  . HERNIA REPAIR Bilateral 2009   Inguinial  . I & D EXTREMITY Right 03/11/2019   Procedure: IRRIGATION AND DEBRIDEMENT EXTREMITY;  Surgeon: [name], MD;  [facility];  Service: Orthopedics;  Laterality: Right;  . IR FLUORO GUIDE CV LINE RIGHT  07/09/2019  . OPEN REDUCTION INTERNAL FIXATION (ORIF) TIBIA/FIBULA FRACTURE Right 06/10/2019   Procedure: EXCISION RIGHT TIBIA AND FIBULA, WOUND DEBRIDEMENT, AND FIXATION OF TIBIA AND FIBULA;  Surgeon: Nadara Mustard, MD;  [facility];  Service: Orthopedics;  Laterality: Right;  . OPEN REDUCTION INTERNAL FIXATION (ORIF) TIBIA/FIBULA FRACTURE Right 06/12/2019   Procedure: DEBRIDEMENT AND REVISION INTERNAL FIXATION INFECTED RIGHT PILON FRACTURE;  Surgeon: Nadara Mustard, MD;  Location: [facility] OR;  Service: Orthopedics;  Laterality: Right;  . ORIF ANKLE FRACTURE Right  03/11/2019   Procedure: OPEN REDUCTION INTERNAL FIXATION (ORIF) ANKLE FRACTURE;  Surgeon: [name], MD;  [facility];  Service: Orthopedics;  Laterality: Right;  . ORIF WRIST FRACTURE Right 03/11/2019   Procedure: OPEN REDUCTION INTERNAL FIXATION (ORIF) WRIST FRACTURE;  Surgeon: [name], MD;  [facility];  Service: Orthopedics;  Laterality: Right;  . TONSILLECTOMY    . TRANSTHORACIC ECHOCARDIOGRAM  01/2019   EF 55 to 60%.  Normal wall motion.  Indeterminate likely grade 1 diastolic function.  Mild degenerative mitral valve disease with thickening but no prolapse.       Family History  Problem Relation Age of Onset  . Prostate cancer Father   . Valvular heart disease Father        Had a heart valve replaced (unsure of details)  . Breast cancer Mother   . Parkinson's disease Mother   . Healthy Sister   . Healthy Brother   . Parkinson's disease Maternal Grandmother   . Diabetes Maternal Grandfather     Social History   Tobacco Use  . Smoking status: Never Smoker  . Smokeless tobacco: Never Used  Substance Use Topics  . Alcohol use: No  . Drug use: No    Home Medications Prior to Admission medications   Medication Sig Start Date End Date Taking? Authorizing Provider  acetaminophen (TYLENOL) 650 MG CR tablet Take 650 mg by mouth every 8 (eight) hours as needed for pain.    [provider]  aspirin EC 81 MG tablet Take 81 mg by mouth daily.    [provider]  Cholecalciferol (VITAMIN D3) 125 MCG (5000 UT) TABS Take 1 tablet (5,000 Units total) by mouth daily. 03/14/19   Montez Morita, PA-C  gabapentin (NEURONTIN) 300 MG capsule Take 1 capsule (300 mg total) by mouth 3 (three) times daily. 12/07/19   Persons, [place], PA  methocarbamol (ROBAXIN) 500 MG tablet Take 1 tablet (500 mg total) by mouth every 6 (six) hours as needed for muscle spasms. 12/17/19   Persons, [place], PA  Multiple Vitamin (MULTIVITAMIN WITH MINERALS) TABS tablet Take 1  tablet by mouth daily.    [provider]  Omega-3 Fatty Acids (FISH OIL PO) Take 1 capsule by mouth daily.    [provider]  oxyCODONE (OXY IR/ROXICODONE) 5 MG immediate release tablet Take 1 tablet (5 mg total) by mouth every 6 (six) hours as needed for moderate pain (pain score 4-6). 12/22/19   Nadara Mustard, MD  propranolol (INDERAL) 10 MG tablet Take 10 mg by mouth 2 (two) times daily as needed (anxiety).     [provider]  sertraline (ZOLOFT) 100 MG tablet Take 100 mg by mouth daily.     [provider]  valACYclovir (VALTREX) 1000 MG tablet Take 1 tablet (1,000 mg total) by mouth 3 (three) times daily for 7 days. 12/25/19 01/01/20  Fawn Desrocher, [place] N, PA-C  vitamin C (VITAMIN C) 500 MG tablet Take  1 tablet (500 mg total) by mouth daily. 03/14/19   Ainsley Spinner, PA-C    Allergies    Patient has no known allergies.  Review of Systems   Review of Systems  Constitutional: Negative for chills and fever.  Musculoskeletal: Positive for myalgias.  All other systems reviewed and are negative.   Physical Exam Updated Vital Signs BP (!) 156/96   Pulse 65   Temp (!) 97.4 F (36.3 C) (Oral)   Resp (!) 22   SpO2 93%   Physical Exam Vitals and nursing note reviewed.  Constitutional:      General: He is not in acute distress.    Appearance: He is well-developed. He is not ill-appearing.  HENT:     Head: Normocephalic and atraumatic.  Eyes:     Conjunctiva/sclera: Conjunctivae normal.  Cardiovascular:     Rate and Rhythm: Normal rate and regular rhythm.  Pulmonary:     Effort: Pulmonary effort is normal. No respiratory distress.     Breath sounds: Normal breath sounds.  Abdominal:     Palpations: Abdomen is soft.  Musculoskeletal:       Arms:     Comments: Left arm with multiple areas of erythematous vesicular rash that follows C5-6 dermatome from the shoulder down to the small left palm.  Rash is tender with palpation.  No purulence.   Generalized tenderness along the biceps muscle, some pain with flexion of elbow against resistance  Skin:    General: Skin is warm.  Neurological:     Mental Status: He is alert.  Psychiatric:        Behavior: Behavior normal.     ED Results / Procedures / Treatments   Labs (all labs ordered are listed, but only abnormal results are displayed) Labs Reviewed - No data to display  EKG None  Radiology No results found.  Procedures Procedures (including critical care time)  Medications Ordered in ED Medications  valACYclovir (VALTREX) tablet 1,000 mg (has no administration in time range)  oxyCODONE (Oxy IR/ROXICODONE) immediate release tablet 5 mg (has no administration in time range)    ED Course  I have reviewed the triage vital signs and the nursing notes.  Pertinent labs & imaging results that were available during my care of the patient were reviewed by me and considered in my medical decision making (see chart for details).    MDM Rules/Calculators/A&P                      Patient with presentation and exam consistent with herpes zoster of the left upper extremity.  Pt had a recent stressor of right BKA likely as aggravating factor for activation of virus. Pt is without systemic symptoms and is overall well-appearing.  No chest pain or shortness of breath to suggest other cardiac cause of patient's symptoms.  Patient is already prescribed gabapentin and oxycodone after BKA, these medications are appropriate for treating symptoms.  We will also prescribe Valtrex, 1 g 3 times daily for 7 days.  Patient is instructed to follow-up closely with PCP and discussed of strict return precautions.  Pt counseled. Pt safe for discharge.  Discussed results, findings, treatment and follow up. Patient advised of return precautions. Patient verbalized understanding and agreed with plan.  Final Clinical Impression(s) / ED Diagnoses Final diagnoses:  Herpes zoster without complication     Rx / DC Orders ED Discharge Orders         Ordered    valACYclovir (VALTREX)  1000 MG tablet  3 times daily     12/25/19 0718           Jalyric Kaestner, [place] N, PA-C 12/25/19 [id number]    Terrilee Files, MD 12/25/19 1739

## 2019-12-28 ENCOUNTER — Other Ambulatory Visit: Payer: Self-pay | Admitting: Orthopedic Surgery

## 2019-12-28 ENCOUNTER — Encounter: Payer: Self-pay | Admitting: Orthopedic Surgery

## 2019-12-28 MED ORDER — OXYCODONE HCL 5 MG PO TABS: 5.0000 mg | ORAL_TABLET | Freq: Four times a day (QID) | ORAL | 0 refills | Status: DC | PRN

## 2020-01-05 ENCOUNTER — Other Ambulatory Visit: Payer: Self-pay | Admitting: Orthopedic Surgery

## 2020-01-05 ENCOUNTER — Encounter: Payer: Self-pay | Admitting: Orthopedic Surgery

## 2020-01-05 MED ORDER — OXYCODONE HCL 5 MG PO TABS: 5.0000 mg | ORAL_TABLET | Freq: Four times a day (QID) | ORAL | 0 refills | Status: DC | PRN

## 2020-01-11 ENCOUNTER — Ambulatory Visit (INDEPENDENT_AMBULATORY_CARE_PROVIDER_SITE_OTHER): Payer: BC Managed Care – PPO | Admitting: Orthopedic Surgery

## 2020-01-11 ENCOUNTER — Encounter: Payer: Self-pay | Admitting: Orthopedic Surgery

## 2020-01-11 ENCOUNTER — Other Ambulatory Visit: Payer: Self-pay

## 2020-01-11 VITALS — Ht 71.0 in | Wt 203.0 lb

## 2020-01-11 DIAGNOSIS — Z89511 Acquired absence of right leg below knee: Secondary | ICD-10-CM

## 2020-01-11 MED ORDER — OXYCODONE HCL 5 MG PO TABS: 5.0000 mg | ORAL_TABLET | Freq: Four times a day (QID) | ORAL | 0 refills | Status: DC | PRN

## 2020-01-11 NOTE — Progress Notes (Signed)
Office Visit Note   Patient: Jonathan Bass           Date of Birth: 11/11/1961           MRN: [id number] Visit Date: 01/11/2020              Requested by: Juluis Rainier, MD [address] [place],  [place] [address] PCP: Juluis Rainier, MD  Chief Complaint  Patient presents with  . Right Leg - Routine Post Op    12/04/19 right BKA       HPI: Patient is a 59 year old gentleman who is 4 weeks status post right transtibial amputation.  He has followed up with the prosthetist and stated that he is ready to be fit with his prosthesis.  Assessment & Plan: Visit Diagnoses:  1. Acquired absence of right leg below knee [facility])     Plan: Patient is given a refill prescription for the oxycodone.  Recommended trying ibuprofen 600 mg 3 times a day and use the oxycodone for breakthrough pain he does not need the Robaxin anymore and will stay on the Neurontin 3 times a day.  Follow-Up Instructions: Return in about 2 months (around 03/10/2020).   Ortho Exam  Patient is alert, oriented, no adenopathy, well-dressed, normal affect, normal respiratory effort. Examination patient has full extension of the knee full flexion.  His calf is 37 cm in circumference thigh is 44 cm in circumference This corresponds to his size extra-large stump shrinker.  He is to follow-up For casting of his leg. Imaging: No results found.   Labs: Lab Results  Component Value Date   ESRSEDRATE 6 08/25/2019   CRP 7.5 08/25/2019   REPTSTATUS 06/17/2019 FINAL 06/12/2019   GRAMSTAIN  06/12/2019    RARE WBC PRESENT, PREDOMINANTLY PMN NO ORGANISMS SEEN    CULT  06/12/2019    RARE PSEUDOMONAS PUTIDA NO ANAEROBES ISOLATED CRITICAL RESULT CALLED TO, READ BACK BY AND VERIFIED WITH: RN Gaynelle Cage  [id number] AT 1315 BY CM Performed at [facility], [address], [place], [place] [address]    LABORGA PSEUDOMONAS PUTIDA 06/12/2019     Lab Results  Component Value Date   ALBUMIN 3.3 (L) 06/14/2019   ALBUMIN 4.0 03/11/2019   ALBUMIN 4.0 01/19/2012    No results found for: MG No results found for: VD25OH  No results found for: PREALBUMIN CBC EXTENDED Latest Ref Rng & Units 12/04/2019 06/10/2019 05/08/2019  WBC 4.0 - 10.5 K/uL 6.1 - 6.4  RBC 4.22 - 5.81 MIL/uL 5.29 - 4.61  HGB 13.0 - 17.0 g/dL 44.8 18.5 63.1  HCT 49.7 - 52.0 % 46.7 - 41.9  PLT 150 - 400 K/uL 225 - 238  NEUTROABS 1.7 - 7.7 K/uL - - -  LYMPHSABS 0.7 - 4.0 K/uL - - -     Body mass index is 28.31 kg/m.  Orders:  No orders of the defined types were placed in this encounter.  No orders of the defined types were placed in this encounter.    Procedures: No procedures performed  Clinical Data: No additional findings.  ROS:  All other systems negative, except as noted in the HPI. Review of Systems  Objective: Vital Signs: Ht 5\' 11"  (1.803 m)   Wt 203 lb (92.1 kg)   BMI 28.31 kg/m   Specialty Comments:  No specialty comments available.  PMFS History: Patient Active Problem List   Diagnosis Date Noted  . 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 Subacute osteomyelitis,  right ankle and foot ([place])   . Pilon fracture of right tibia, sequela 06/10/2019  . Displaced pilon fracture of right tibia, initial encounter for open fracture type IIIA, IIIB, or IIIC   . Subacute osteomyelitis of right tibia ([place])   . Surgery, elective   . Anxiety   . Open pilon fracture, right, type III, initial encounter 03/11/2019  . Closed fracture of right distal radius 03/11/2019  . Type III open pilon fracture of right tibia 03/11/2019  . Pounding heartbeat 01/21/2019  . Precordial chest pain 03/05/2014   Past Medical History:  Diagnosis Date  . Anxiety   . GERD (gastroesophageal reflux disease)    02/21/2019- not current   . Pulmonary nodule 10/2014   Noted on CT scan -> stable and follow-up 2019.  No further evaluation necessary.    Family History  Problem Relation Age of Onset  . Prostate cancer Father   .  Valvular heart disease Father        Had a heart valve replaced (unsure of details)  . Breast cancer Mother   . Parkinson's disease Mother   . Healthy Sister   . Healthy Brother   . Parkinson's disease Maternal Grandmother   . Diabetes Maternal Grandfather     Past Surgical History:  Procedure Laterality Date  . AMPUTATION Right 12/04/2019   Procedure: RIGHT BELOW KNEE AMPUTATION;  Surgeon: Newt Minion, MD;  Location: [place];  Service: Orthopedics;  Laterality: Right;  . APPLICATION OF WOUND VAC Right 06/10/2019   Procedure: Application Of Wound Vac -Prevena;  Surgeon: Newt Minion, MD;  Location: [place];  Service: Orthopedics;  Laterality: Right;  . CARDIAC EVENT MONITOR  01/2019   redominantly NSR with average rate 60 bpm.  One short 4 beat run of NSVT.  Minimal PACs and PVCs noted.  No sustained arrhythmia.  Marland Kitchen CLOSED REDUCTION WRIST FRACTURE Right 03/11/2019   Procedure: CLOSED REDUCTION WRIST;  Surgeon: Shona Needles, MD;  Location: [place];  Service: Orthopedics;  Laterality: Right;  . COLONOSCOPY    . EXTERNAL FIXATION LEG Right 03/11/2019   Procedure: EXTERNAL FIXATION LEG;  Surgeon: Shona Needles, MD;  Location: [place];  Service: Orthopedics;  Laterality: Right;  . EXTERNAL FIXATION REMOVAL Right 05/08/2019   Procedure: REMOVAL EXTERNAL FIXATION ANKLE, PLACEMENT OF CAST;  Surgeon: Shona Needles, MD;  Location: [place];  Service: Orthopedics;  Laterality: Right;  . FOOT SURGERY Bilateral    hammer toe  . HERNIA REPAIR Bilateral 2009   Inguinial  . I & D EXTREMITY Right 03/11/2019   Procedure: IRRIGATION AND DEBRIDEMENT EXTREMITY;  Surgeon: Shona Needles, MD;  Location: [place];  Service: Orthopedics;  Laterality: Right;  . IR FLUORO GUIDE CV LINE RIGHT  07/09/2019  . OPEN REDUCTION INTERNAL FIXATION (ORIF) TIBIA/FIBULA FRACTURE Right 06/10/2019   Procedure: EXCISION RIGHT TIBIA AND FIBULA, WOUND DEBRIDEMENT, AND FIXATION OF TIBIA AND FIBULA;  Surgeon: Newt Minion, MD;   Location: [place];  Service: Orthopedics;  Laterality: Right;  . OPEN REDUCTION INTERNAL FIXATION (ORIF) TIBIA/FIBULA FRACTURE Right 06/12/2019   Procedure: DEBRIDEMENT AND REVISION INTERNAL FIXATION INFECTED RIGHT PILON FRACTURE;  Surgeon: Newt Minion, MD;  Location: [place];  Service: Orthopedics;  Laterality: Right;  . ORIF ANKLE FRACTURE Right 03/11/2019   Procedure: OPEN REDUCTION INTERNAL FIXATION (ORIF) ANKLE FRACTURE;  Surgeon: Shona Needles, MD;  Location: [place];  Service: Orthopedics;  Laterality: Right;  . ORIF WRIST FRACTURE Right 03/11/2019  Procedure: OPEN REDUCTION INTERNAL FIXATION (ORIF) WRIST FRACTURE;  Surgeon: [name], MD;  Location[facility];  Service: Orthopedics;  Laterality: Right;  . TONSILLECTOMY    . TRANSTHORACIC ECHOCARDIOGRAM  01/2019   EF 55 to 60%.  Normal wall motion.  Indeterminate likely grade 1 diastolic function.  Mild degenerative mitral valve disease with thickening but no prolapse.   Social History   Occupational History  . Occupation: Production designer, theatre/television/film at [employer]: [employer]  Tobacco Use  . Smoking status: Never Smoker  . Smokeless tobacco: Never Used  Substance and Sexual Activity  . Alcohol use: No  . Drug use: No  . Sexual activity: Not on file

## 2020-01-18 DIAGNOSIS — Z89511 Acquired absence of right leg below knee: Secondary | ICD-10-CM | POA: Diagnosis not present

## 2020-01-24 ENCOUNTER — Encounter: Payer: Self-pay | Admitting: Orthopedic Surgery

## 2020-01-25 ENCOUNTER — Other Ambulatory Visit: Payer: Self-pay | Admitting: Orthopedic Surgery

## 2020-01-25 MED ORDER — OXYCODONE HCL 5 MG PO TABS: 5.0000 mg | ORAL_TABLET | Freq: Four times a day (QID) | ORAL | 0 refills | Status: DC | PRN

## 2020-02-05 ENCOUNTER — Encounter: Payer: Self-pay | Admitting: Orthopedic Surgery

## 2020-02-05 ENCOUNTER — Other Ambulatory Visit: Payer: Self-pay | Admitting: Physician Assistant

## 2020-02-05 MED ORDER — GABAPENTIN 300 MG PO CAPS: 300.0000 mg | ORAL_CAPSULE | Freq: Three times a day (TID) | ORAL | 1 refills | Status: DC

## 2020-02-14 ENCOUNTER — Encounter: Payer: Self-pay | Admitting: Orthopedic Surgery

## 2020-02-15 ENCOUNTER — Ambulatory Visit: Payer: BC Managed Care – PPO

## 2020-02-15 NOTE — Progress Notes (Signed)
Patient work in today for a nurse only visit. 1 retained staples in right BKA. Pt noticed under scabbing at the incision. This was removed today without incident. Pt wearing shrinker and ambulates with crutches. Has an apt for casting of prosthetic. Will keep regularly scheduled appointment and call with any questions.    Jonathan Bass, RMA, [employer]

## 2020-02-22 ENCOUNTER — Other Ambulatory Visit: Payer: Self-pay

## 2020-02-22 ENCOUNTER — Encounter: Payer: Self-pay | Admitting: Physical Therapy

## 2020-02-22 ENCOUNTER — Ambulatory Visit (INDEPENDENT_AMBULATORY_CARE_PROVIDER_SITE_OTHER): Payer: BC Managed Care – PPO | Admitting: Physical Therapy

## 2020-02-22 DIAGNOSIS — R2681 Unsteadiness on feet: Secondary | ICD-10-CM | POA: Diagnosis not present

## 2020-02-22 DIAGNOSIS — R293 Abnormal posture: Secondary | ICD-10-CM

## 2020-02-22 DIAGNOSIS — G546 Phantom limb syndrome with pain: Secondary | ICD-10-CM

## 2020-02-22 DIAGNOSIS — R2689 Other abnormalities of gait and mobility: Secondary | ICD-10-CM

## 2020-02-22 NOTE — Therapy (Signed)
[facility] [address] [place], [place], [address] Phone: [phone]   Fax:  [phone]  Physical Therapy Evaluation  Patient Details  Name: Jonathan Bass MRN: [id number] Date of Birth: 01-28-1961 Referring Provider (PT): Meridee Score, MD   Encounter Date: 02/22/2020  PT End of Session - 02/22/20 1639    Visit Number  1    Number of Visits  16    Authorization Type  BCBS    Authorization Time Period  Ded MET OOP Met $0 co-pay    PT Start Time  1310    PT Stop Time  1400    PT Time Calculation (min)  50 min    Equipment Utilized During Treatment  Gait belt    Activity Tolerance  Patient tolerated treatment well    Behavior During Therapy  WFL for tasks assessed/performed       Past Medical History:  Diagnosis Date  . Anxiety   . GERD (gastroesophageal reflux disease)    02/21/2019- not current   . Pulmonary nodule 10/2014   Noted on CT scan -> stable and follow-up 2019.  No further evaluation necessary.    Past Surgical History:  Procedure Laterality Date  . AMPUTATION Right 12/04/2019   Procedure: RIGHT BELOW KNEE AMPUTATION;  Surgeon: Newt Minion, MD;  Location: [place];  Service: Orthopedics;  Laterality: Right;  . APPLICATION OF WOUND VAC Right 06/10/2019   Procedure: Application Of Wound Vac -Prevena;  Surgeon: Newt Minion, MD;  Location: [place];  Service: Orthopedics;  Laterality: Right;  . CARDIAC EVENT MONITOR  01/2019   redominantly NSR with average rate 60 bpm.  One short 4 beat run of NSVT.  Minimal PACs and PVCs noted.  No sustained arrhythmia.  Marland Kitchen CLOSED REDUCTION WRIST FRACTURE Right 03/11/2019   Procedure: CLOSED REDUCTION WRIST;  Surgeon: Shona Needles, MD;  Location: [place];  Service: Orthopedics;  Laterality: Right;  . COLONOSCOPY    . EXTERNAL FIXATION LEG Right 03/11/2019   Procedure: EXTERNAL FIXATION LEG;  Surgeon: Shona Needles, MD;  Location: [place];  Service: Orthopedics;  Laterality: Right;  . EXTERNAL  FIXATION REMOVAL Right 05/08/2019   Procedure: REMOVAL EXTERNAL FIXATION ANKLE, PLACEMENT OF CAST;  Surgeon: Shona Needles, MD;  Location: [place];  Service: Orthopedics;  Laterality: Right;  . FOOT SURGERY Bilateral    hammer toe  . HERNIA REPAIR Bilateral 2009   Inguinial  . I & D EXTREMITY Right 03/11/2019   Procedure: IRRIGATION AND DEBRIDEMENT EXTREMITY;  Surgeon: Shona Needles, MD;  Location: [place];  Service: Orthopedics;  Laterality: Right;  . IR FLUORO GUIDE CV LINE RIGHT  07/09/2019  . OPEN REDUCTION INTERNAL FIXATION (ORIF) TIBIA/FIBULA FRACTURE Right 06/10/2019   Procedure: EXCISION RIGHT TIBIA AND FIBULA, WOUND DEBRIDEMENT, AND FIXATION OF TIBIA AND FIBULA;  Surgeon: Newt Minion, MD;  Location: [place];  Service: Orthopedics;  Laterality: Right;  . OPEN REDUCTION INTERNAL FIXATION (ORIF) TIBIA/FIBULA FRACTURE Right 06/12/2019   Procedure: DEBRIDEMENT AND REVISION INTERNAL FIXATION INFECTED RIGHT PILON FRACTURE;  Surgeon: Newt Minion, MD;  Location: [place];  Service: Orthopedics;  Laterality: Right;  . ORIF ANKLE FRACTURE Right 03/11/2019   Procedure: OPEN REDUCTION INTERNAL FIXATION (ORIF) ANKLE FRACTURE;  Surgeon: Shona Needles, MD;  Location: [place];  Service: Orthopedics;  Laterality: Right;  . ORIF WRIST FRACTURE Right 03/11/2019   Procedure: OPEN REDUCTION INTERNAL FIXATION (ORIF) WRIST FRACTURE;  Surgeon: Shona Needles, MD;  Location: [facility]  OR;  Service: Orthopedics;  Laterality: Right;  . TONSILLECTOMY    . TRANSTHORACIC ECHOCARDIOGRAM  01/2019   EF 55 to 60%.  Normal wall motion.  Indeterminate likely grade 1 diastolic function.  Mild degenerative mitral valve disease with thickening but no prolapse.    There were no vitals filed for this visit.   Subjective Assessment - 02/22/20 1319    Subjective  This 59yo male was referred to PT on 02/18/2020 for Gait Training by Meridee Score, MD. 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 He received prosthesis 02/17/2020.    Limitations  Lifting;Standing;Walking;House hold activities    Patient Stated Goals  He would like to return to pre injury exercising (weights & cardio / running, elliptical, biking), hiking, road cycling, boating / water ski, softball,    Currently in Pain?  Yes    Pain Score  5    in last week, worst 5/10, best 0/10   Pain Location  Other (Comment)   phantom   Pain Orientation  Right    Pain Descriptors / Indicators  Tingling    Pain Type  Phantom pain    Pain Onset  More than a month ago    Pain Frequency  Constant    Aggravating Factors   being up on prosthesis    Pain Relieving Factors  being active         [facility] PT Assessment - 02/22/20 1315      Assessment   Medical Diagnosis  Right Transtibial Amputation    Referring Provider (PT)  Meridee Score, MD    Onset Date/Surgical Date  02/17/20    Hand Dominance  Right      Precautions   Precautions  Fall      Restrictions   Weight Bearing Restrictions  No      Balance Screen   Has the patient fallen in the past 6 months  Yes    How many times?  1   hopping up steps, bruised residual limb   Has the patient had a decrease in activity level because of a fear of falling?   No    Is the patient reluctant to leave their home because of a fear of falling?   No      Home Environment   Living Environment  Private residence    Living Arrangements  Spouse/significant other    Type of [place] entrance   ramp over 2 steps no rails from garage   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[employer], traveling / flying,     Leisure  active exercising & outdoor activities      Posture/Postural Control   Posture/Postural Control  Postural  limitations    Postural Limitations  Weight shift left      ROM / Strength   AROM / PROM / Strength  PROM;Strength      PROM   Overall PROM   Within functional limits for tasks performed      Strength   Overall Strength  Within functional limits for tasks performed      Transfers   Transfers  Sit to Stand;Stand to Sit    Sit to Stand  7: Independent;Without upper extremity assist;From chair/3-in-1    Stand to Sit  7: Independent;Without upper extremity assist;To chair/3-in-1      Ambulation/Gait   Ambulation/Gait  Yes    Ambulation/Gait Assistance  5: Supervision    Ambulation Distance (Feet)  150 Feet    Assistive device  Prosthesis;None;Straight cane   arrived using cane/prosthesis, assessed with prosthesis only   Gait Pattern  Step-through pattern;Decreased arm swing - right;Decreased stance time - right;Decreased step length - left;Decreased hip/knee flexion - right;Decreased weight shift to right;Right hip hike;Right flexed knee in stance;Antalgic;Abducted- right    Ambulation Surface  Indoor;Level    Gait velocity  2.01 ft/sec      Standardized Balance Assessment   Standardized Balance Assessment  Berg Balance Test      Berg Balance Test   Sit to Stand  Able to stand without using hands and stabilize independently    Standing Unsupported  Able to stand safely 2 minutes    Sitting with Back Unsupported but Feet Supported on Floor or Stool  Able to sit safely and securely 2 minutes    Stand to Sit   Sits safely with minimal use of hands    Transfers  Able to transfer safely, minor use of hands    Standing Unsupported with Eyes Closed  Able to stand 10 seconds safely    Standing Unsupported with Feet Together  Able to place feet together independently and stand 1 minute safely    From Standing, Reach Forward with Outstretched Arm  Can reach confidently >25 cm (10")    From Standing Position, Pick up Object from Floor  Able to pick up shoe, needs supervision    From Standing Position, Turn to Look Behind Over each Shoulder  Looks behind one side only/other side shows less weight shift    Turn 360 Degrees  Able to turn 360 degrees safely but slowly    Standing Unsupported, Alternately Place Feet on Step/Stool  Able to complete 4 steps without aid or supervision    Standing Unsupported, One Foot in Front  Able to plae foot ahead of the other independently and hold 30 seconds    Standing on One Leg  Able to lift leg independently and hold > 10 seconds    Total Score  49      Functional Gait  Assessment   Gait assessed   Yes    Gait Level Surface  Walks 20 ft, slow speed, abnormal gait pattern, evidence for imbalance or deviates 10-15 in outside of the 12 in walkway width. Requires more than 7 sec to ambulate 20 ft.    Change in Gait Speed  Makes only minor adjustments to walking speed, or accomplishes a change in speed with significant gait deviations, deviates 10-15 in outside the 12 in walkway width, or changes speed but loses balance but is able to recover and continue walking.    Gait with Horizontal Head Turns  Performs head turns with moderate changes in gait velocity, slows down,  deviates 10-15 in outside 12 in walkway width but recovers, can continue to walk.    Gait with Vertical Head Turns  Performs task with moderate change in gait velocity, slows down, deviates 10-15 in outside 12 in walkway width but recovers, can continue to walk.    Gait and Pivot Turn  Turns slowly, requires  verbal cueing, or requires several small steps to catch balance following turn and stop    Step Over Obstacle  Is able to step over one shoe box (4.5 in total height) but must slow down and adjust steps to clear box safely. May require verbal cueing.    Gait with Narrow Base of Support  Ambulates less than 4 steps heel to toe or cannot perform without assistance.    Gait with Eyes Closed  Walks 20 ft, slow speed, abnormal gait pattern, evidence for imbalance, deviates 10-15 in outside 12 in walkway width. Requires more than 9 sec to ambulate 20 ft.    Ambulating Backwards  Walks 20 ft, slow speed, abnormal gait pattern, evidence for imbalance, deviates 10-15 in outside 12 in walkway width.    Steps  Two feet to a stair, must use rail.    Total Score  9      Prosthetics Assessment - 02/22/20 1315      Prosthetics   Prosthetic Care Dependent with  Skin check;Residual limb care;Care of non-amputated limb;Prosthetic cleaning;Ply sock cleaning;Correct ply sock adjustment;Proper wear schedule/adjustment;Proper weight-bearing schedule/adjustment    Donning prosthesis   Supervision    Doffing prosthesis   Supervision    Current prosthetic wear tolerance (days/week)   daily 5 of 5 days since delivery    Current prosthetic wear tolerance (#hours/day)   reports up to 1.5hrs at a time up to 6hrs total     Current prosthetic weight-bearing tolerance (hours/day)   Pt tolerated 10 minutes standing & gait activities without pain or discomfort.      Edema  non-pitting     Residual limb condition   wound lateral incision with black scab 54m X 978m invagination of scar, no adherance of scar, good hair growth, normal color, sweaty but skin not moist or dry,     Prosthesis Description  silicon liner with shuttle pin lock, dynamic response Flex Foot    K code/activity level with prosthetic use   K4 full community with variable cadence, high impact               Objective measurements completed on  examination: See above findings.      [facility] PT Treatment/Exercise - 02/22/20 1315      Prosthetics   Prosthetic Care Comments   initiate wear 2hrs 3x/day daily with plans to increase weekly if no issues.  PT demo & instructed in stationary bike use with prosthesis (foot placement heel on pedal & seat adjusted to allow knee ROM for rotating but staying on pedal)  Recommend starting with 6m42m2 sets low resistance and comfortable pace. Build up time first. Pt verbalized understanding.      Education Provided  Skin check;Residual limb care;Prosthetic cleaning;Correct ply sock adjustment;Proper Donning;Proper wear schedule/adjustment;Other (comment)   see prosthetic care comments   Person(s) Educated  Patient    Education Method  Explanation;Demonstration;Tactile cues;Verbal cues    Education Method  Verbalized understanding;Returned demonstration;Tactile cues required;Verbal cues required;Needs further instruction    Donning Prosthesis  Supervision               PT Short Term Goals -  02/22/20 2145      PT SHORT TERM GOAL #1   Title  Patient tolerates prosthesis wear >12hrs/ day with no increase in wound size.    Time  4    Period  Weeks    Status  New    Target Date  03/17/20      PT SHORT TERM GOAL #2   Title  Patient verbalizes proper sweat management and phantom pain management.    Time  4    Period  Weeks    Status  New    Target Date  03/17/20      PT SHORT TERM GOAL #3   Title  Berg Balance >52/56 indicating low fall risk.    Time  4    Period  Weeks    Status  New    Target Date  03/17/20      PT SHORT TERM GOAL #4   Title  Patient ambulates 500' with prosthesis only with supervision / verbal cues only.    Time  4    Period  Weeks    Status  New    Target Date  03/17/20      PT SHORT TERM GOAL #5   Title  Patient negotiates ramps, curbs & stairs with single rail reciprocally with supervision.    Time  4    Period  Weeks    Status  New    Target  Date  03/17/20      Additional Short Term Goals   Additional Short Term Goals  Yes      PT SHORT TERM GOAL #6   Title  Patient demonstrates proper lifting & carrying 25# and stationary cycling with prosthesis.    Time  4    Period  Weeks    Status  New    Target Date  03/17/20        PT Long Term Goals - 02/22/20 2136      PT LONG TERM GOAL #1   Title  Patient tolerates wear of prosthesis >90% of awake hours with skin or limb pain issues to enable function throughout his day.  (All LTGs Target Date: 04/14/2020)    Time  8    Period  Weeks    Status  New    Target Date  04/14/20      PT LONG TERM GOAL #2   Title  Patient verbalizes & demonstrates understanding of proper prosthetic care to enable safe use of prosthesis.    Time  8    Period  Weeks    Status  New    Target Date  04/14/20      PT LONG TERM GOAL #3   Title  Functional Gait Assessment >20/30 with prosthesis only.    Time  8    Period  Weeks    Status  New    Target Date  04/14/20      PT LONG TERM GOAL #4   Title  Patient ambulates >1000' with prosthesis only including ramps, curbs & stairs independently for full community mobility.    Time  8    Period  Weeks    Status  New    Target Date  04/14/20      PT LONG TERM GOAL #5   Title  Patient demonstrates ability to lift, carry, ride bike and perform prior functional activities with prosthesis safely.    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 He received his first prosthesis on 02/17/2020 (5 days prior to PT evaluation) and is dependent in proper care. He has worn prosthesis daily for those 5 days up to 1.5hrs at time up to 6hrs total /day. He has wound on lateral scar. Patient needs skilled  instruction to safely wear & use prosthesis. He has phantom pain issues. Berg Balance 49/56 indicates 50% fall risk. Functional Gait Assessment 9/30 indicates high fall risk.  Patient is able to ambulate without device except prosthesis but limited distances (~150') with significant gait deviations. Patient was active including exercising, cycling, hiking, boating, etc. and his goal is to return to prior level of activity. Patient would benefit from skilled instructions.    Personal Factors and Comorbidities  Comorbidity 2;Time since onset of injury/illness/exacerbation    Comorbidities  Rt TTA, Herpes Zoster, right radius fx, pulmonary nodule,    Examination-Activity Limitations  Lift;Locomotion Level;Squat;Stairs;Stand;Transfers    Examination-Participation Restrictions  Community Activity;Yard Work;Other   exercising & high activity level   Stability/Clinical Decision Making  Stable/Uncomplicated    Clinical Decision Making  Moderate    Rehab Potential  Good    PT Frequency  2x / week    PT Duration  8 weeks    PT Treatment/Interventions  ADLs/Self Care Home Management;Electrical Stimulation;DME Instruction;Gait training;Stair training;Functional mobility training;Therapeutic activities;Therapeutic exercise;Balance training;Neuromuscular re-education;Patient/family education;Prosthetic Training;Manual techniques;Vestibular    PT Next Visit Plan  review prosthetic care, HEP balance / weight lifting, prosthetic gait    Consulted and Agree with Plan of Care  Patient       Patient will benefit from skilled therapeutic intervention in order to improve the following deficits and impairments:  Abnormal gait, Decreased activity tolerance, Decreased balance, Decreased knowledge of use of DME, Decreased mobility, Decreased skin integrity, Postural dysfunction, Prosthetic Dependency, Pain  Visit Diagnosis: Other abnormalities of gait and mobility  Unsteadiness on feet  Abnormal posture  Phantom  limb syndrome with pain [facility])     Problem List Patient Active Problem List   Diagnosis Date Noted  . Gangrene of right foot ([place]) 12/04/2019  . Subacute osteomyelitis, right ankle and foot ([place])   . Pilon fracture of right tibia, sequela 06/10/2019  . Displaced pilon fracture of right tibia, initial encounter for open fracture type IIIA, IIIB, or IIIC   . Subacute osteomyelitis of right tibia ([place])   . Surgery, elective   . Anxiety   . Open pilon fracture, right, type III, initial encounter 03/11/2019  . Closed fracture of right distal radius 03/11/2019  . Type III open pilon fracture of right tibia 03/11/2019  . Pounding heartbeat 01/21/2019  . Precordial chest pain 03/05/2014    Jonathan Bass PT, DPT 02/22/2020, 9:51 PM  [facility] [address] [place], [place], [address] Phone: [phone]   Fax:  [phone]  Name: Jonathan Bass MRN: [id number] Date of Birth: [birth year]

## 2020-02-26 ENCOUNTER — Other Ambulatory Visit: Payer: Self-pay

## 2020-02-26 ENCOUNTER — Ambulatory Visit (INDEPENDENT_AMBULATORY_CARE_PROVIDER_SITE_OTHER): Payer: BC Managed Care – PPO | Admitting: Rehabilitative and Restorative Service Providers"

## 2020-02-26 ENCOUNTER — Encounter: Payer: Self-pay | Admitting: Rehabilitative and Restorative Service Providers"

## 2020-02-26 ENCOUNTER — Encounter: Payer: BC Managed Care – PPO | Admitting: Rehabilitative and Restorative Service Providers"

## 2020-02-26 DIAGNOSIS — R2681 Unsteadiness on feet: Secondary | ICD-10-CM | POA: Diagnosis not present

## 2020-02-26 DIAGNOSIS — R293 Abnormal posture: Secondary | ICD-10-CM | POA: Diagnosis not present

## 2020-02-26 DIAGNOSIS — R2689 Other abnormalities of gait and mobility: Secondary | ICD-10-CM | POA: Diagnosis not present

## 2020-02-26 DIAGNOSIS — R262 Difficulty in walking, not elsewhere classified: Secondary | ICD-10-CM

## 2020-02-26 NOTE — Patient Instructions (Addendum)
Sweating increases with an amputation. Your body is trying to regulate your temperature & without an extremity, you sweat more easily to cool off. Also prosthetic material like liners do not breath and add hot layers which causes even more sweating. With time your body typically will accommodate to prosthesis and your sweat level will come closer to level with amputation but not pre-amputation level.   You need to pat your limb & liner dry when you notice sweating. If you leave sweat trapped inside your liner, then it can result in a blister.   Signs of sweating in your liner: 1. You are sweating elsewhere on your body or you notice sweat running / dripping.  2. Take note of how high your liner comes up on your limb when you first put your liner on your limb. If you notice that your liner has slipped down, then you probably have sweat inside your liner. A good time to check for liner slippage is when toileting.  3. You feel air bubbles inside your liner. When you liner slips, then air is allowed in bottom. As you put weight on prosthesis, the air is burp or pushed out. 4. You feel something crawling or moving inside your liner. When sweat runs inside the closed system of liner, it often feels like a bug or something crawling inside your liner.  If any of above symptoms are noted, you need to remove your prosthesis & liner to pat your limb & liner dry. This is permanent need as leaving sweat or water trapped can result in a blister or wound.   Do each exercise 1-2  times per day Do each exercise 10 repetitions Hold each exercise for 2 seconds to feel your location  AT SINK FIND YOUR MIDLINE POSITION AND PLACE FEET EQUAL DISTANCE FROM THE MIDLINE.  Try to find this position when standing still for activities.   USE TAPE ON FLOOR TO MARK THE MIDLINE POSITION. You also should try to feel with your limb pressure in socket.  You are trying to feel with limb what you used to feel with the bottom of your  foot.  Side to Side Shift: Moving your hips only (not shoulders): move weight onto your left leg, HOLD/FEEL.  Move back to equal weight on each leg, HOLD/FEEL. Move weight onto your right leg, HOLD/FEEL. Move back to equal weight on each leg, HOLD/FEEL. Repeat.  Start with both hands on sink, progress to right hand only, then no hands.   Front to Back Shift: Moving your hips only (not shoulders): move your weight forward onto your toes, HOLD/FEEL. Move your weight back to equal Flat Foot on both legs, HOLD/FEEL. Move your weight back onto your heels, HOLD/FEEL. Move your weight back to equal on both legs, HOLD/FEEL. Repeat.   tart with both hands on sink, progress to right hand only, then no hands.

## 2020-02-26 NOTE — Therapy (Signed)
[facility] [address] [place], [place], [address] Phone: [phone]   Fax:  [phone]  Physical Therapy Treatment  Patient Details  Name: Jonathan Bass MRN: [id number] Date of Birth: 06-14-1961 Referring Provider (PT): Meridee Score, MD   Encounter Date: 02/26/2020  PT End of Session - 02/26/20 1159    Visit Number  2    Number of Visits  16    Authorization Type  BCBS    Authorization Time Period  Ded MET OOP Met $0 co-pay    PT Start Time  [phone]    PT Stop Time  1019    PT Time Calculation (min)  42 min    Equipment Utilized During Treatment  Gait belt    Activity Tolerance  Patient tolerated treatment well    Behavior During Therapy  WFL for tasks assessed/performed       Past Medical History:  Diagnosis Date  . Anxiety   . GERD (gastroesophageal reflux disease)    02/21/2019- not current   . Pulmonary nodule 10/2014   Noted on CT scan -> stable and follow-up 2019.  No further evaluation necessary.    Past Surgical History:  Procedure Laterality Date  . AMPUTATION Right 12/04/2019   Procedure: RIGHT BELOW KNEE AMPUTATION;  Surgeon: Newt Minion, MD;  Location: [place];  Service: Orthopedics;  Laterality: Right;  . APPLICATION OF WOUND VAC Right 06/10/2019   Procedure: Application Of Wound Vac -Prevena;  Surgeon: Newt Minion, MD;  Location: [place];  Service: Orthopedics;  Laterality: Right;  . CARDIAC EVENT MONITOR  01/2019   redominantly NSR with average rate 60 bpm.  One short 4 beat run of NSVT.  Minimal PACs and PVCs noted.  No sustained arrhythmia.  Marland Kitchen CLOSED REDUCTION WRIST FRACTURE Right 03/11/2019   Procedure: CLOSED REDUCTION WRIST;  Surgeon: Shona Needles, MD;  Location: [place];  Service: Orthopedics;  Laterality: Right;  . COLONOSCOPY    . EXTERNAL FIXATION LEG Right 03/11/2019   Procedure: EXTERNAL FIXATION LEG;  Surgeon: Shona Needles, MD;  Location: [place];  Service: Orthopedics;  Laterality: Right;  . EXTERNAL  FIXATION REMOVAL Right 05/08/2019   Procedure: REMOVAL EXTERNAL FIXATION ANKLE, PLACEMENT OF CAST;  Surgeon: Shona Needles, MD;  Location: [place];  Service: Orthopedics;  Laterality: Right;  . FOOT SURGERY Bilateral    hammer toe  . HERNIA REPAIR Bilateral 2009   Inguinial  . I & D EXTREMITY Right 03/11/2019   Procedure: IRRIGATION AND DEBRIDEMENT EXTREMITY;  Surgeon: Shona Needles, MD;  Location: [place];  Service: Orthopedics;  Laterality: Right;  . IR FLUORO GUIDE CV LINE RIGHT  07/09/2019  . OPEN REDUCTION INTERNAL FIXATION (ORIF) TIBIA/FIBULA FRACTURE Right 06/10/2019   Procedure: EXCISION RIGHT TIBIA AND FIBULA, WOUND DEBRIDEMENT, AND FIXATION OF TIBIA AND FIBULA;  Surgeon: Newt Minion, MD;  Location: [place];  Service: Orthopedics;  Laterality: Right;  . OPEN REDUCTION INTERNAL FIXATION (ORIF) TIBIA/FIBULA FRACTURE Right 06/12/2019   Procedure: DEBRIDEMENT AND REVISION INTERNAL FIXATION INFECTED RIGHT PILON FRACTURE;  Surgeon: Newt Minion, MD;  Location: [place];  Service: Orthopedics;  Laterality: Right;  . ORIF ANKLE FRACTURE Right 03/11/2019   Procedure: OPEN REDUCTION INTERNAL FIXATION (ORIF) ANKLE FRACTURE;  Surgeon: Shona Needles, MD;  Location: [place];  Service: Orthopedics;  Laterality: Right;  . ORIF WRIST FRACTURE Right 03/11/2019   Procedure: OPEN REDUCTION INTERNAL FIXATION (ORIF) WRIST FRACTURE;  Surgeon: Shona Needles, MD;  [facility];  Service: Orthopedics;  Laterality: Right;  . TONSILLECTOMY    . TRANSTHORACIC ECHOCARDIOGRAM  01/2019   EF 55 to 60%.  Normal wall motion.  Indeterminate likely grade 1 diastolic function.  Mild degenerative mitral valve disease with thickening but no prolapse.    There were no vitals filed for this visit.  Subjective Assessment - 02/26/20 0940    Subjective  Patient reports he has been working on adjusting his socks. Reports wearing 2-2.5hrs/3x/day. Has been riding stationary bike.    Limitations  Lifting;Standing;Walking;House hold  activities    Patient Stated Goals  He would like to return to pre injury exercising (weights & cardio / running, elliptical, biking), hiking, road cycling, boating / water ski, softball,    Currently in Pain?  No/denies                       [facility] Adult PT Treatment/Exercise - 02/26/20 1030      Transfers   Transfers  Sit to Stand;Stand to Sit    Sit to Stand  7: Independent;Without upper extremity assist;From chair/3-in-1    Stand to Sit  7: Independent;Without upper extremity assist;To chair/3-in-1      Ambulation/Gait   Ambulation/Gait  Yes    Ambulation/Gait Assistance  5: Supervision    Ambulation Distance (Feet)  150 Feet   x 2    Assistive device  Prosthesis;Straight cane    Gait Pattern  Step-through pattern;Decreased arm swing - right;Decreased stance time - right;Decreased step length - left;Decreased hip/knee flexion - right;Decreased weight shift to right;Right hip hike;Right flexed knee in stance;Antalgic;Abducted- right    Ambulation Surface  Level;Indoor    Gait velocity  --      Posture/Postural Control   Posture/Postural Control  --    Postural Limitations  --      High Level Balance   High Level Balance Activities  Other (comment)    High Level Balance Comments  Static standing straddling line in flooring - finding midline. Progressed to fwd/back and lateral weight shifting cueing patient to attend to the "feeling" in residual limb against socket of being in each "shifted" position with frequent returns to midline. Required verbal and tactile cueing for proper technique.      Therapeutic Activites    Therapeutic Activities  Other Therapeutic Activities   retrieving objects from floor   Other Therapeutic Activities  Retrieving object [facility]) from floor - required demo, verbal and tactile cueing for proper technique, sequencing and safety with prosthesis      Prosthetics   Prosthetic Care Comments   PT educated to maintain wear time as 2 hrs 3x/day  daily with plans to increase wear time at next visit (after approximately 1 week with current wear schedule) pending no skin integrity issues. sweat management with prosthesis (see patient instructions) and use of half socks as needed as part of sock ply adjustments. Trialled too few, too many, and appropriate sock plies to educate patient on how to assess for proper sock ply usage via patellar location, pressure, pin clicks once weight bearing    Current prosthetic wear tolerance (days/week)   daily    Current prosthetic wear tolerance (#hours/day)   2 - 2.5 hrs, 3x/day    Current prosthetic weight-bearing tolerance (hours/day)   Tolerated 10 minutes of standing balance training with no reports of discomfort    Edema  non-pitting     Residual limb condition   wound on lateral incision with scab reducing  since last visit; medial edge of incision possible suture poking through (no pain/discomfort here; continue to monitor)    Education Provided  Skin check;Residual limb care;Prosthetic cleaning;Correct ply sock adjustment;Proper Donning;Proper wear schedule/adjustment;Other (comment)   see prosthetic care comments   Person(s) Educated  Patient    Education Method  Explanation;Demonstration;Tactile cues;Verbal cues    Education Method  Verbalized understanding;Returned demonstration;Tactile cues required;Verbal cues required;Needs further instruction    Donning Prosthesis  Supervision             PT Education - 02/26/20 1545    Education Details  initiated weight shifting HEP (see patient instructions), prosthetic wear and care (see prosthetics section and handout issued in patient instructions)    Person(s) Educated  Patient    Methods  Explanation;Demonstration;Tactile cues;Verbal cues;Handout    Comprehension  Verbalized understanding;Returned demonstration;Verbal cues required;Need further instruction       PT Short Term Goals - 02/22/20 2145      PT SHORT TERM GOAL #1   Title   Patient tolerates prosthesis wear >12hrs/ day with no increase in wound size.    Time  4    Period  Weeks    Status  New    Target Date  03/17/20      PT SHORT TERM GOAL #2   Title  Patient verbalizes proper sweat management and phantom pain management.    Time  4    Period  Weeks    Status  New    Target Date  03/17/20      PT SHORT TERM GOAL #3   Title  Berg Balance >52/56 indicating low fall risk.    Time  4    Period  Weeks    Status  New    Target Date  03/17/20      PT SHORT TERM GOAL #4   Title  Patient ambulates 500' with prosthesis only with supervision / verbal cues only.    Time  4    Period  Weeks    Status  New    Target Date  03/17/20      PT SHORT TERM GOAL #5   Title  Patient negotiates ramps, curbs & stairs with single rail reciprocally with supervision.    Time  4    Period  Weeks    Status  New    Target Date  03/17/20      Additional Short Term Goals   Additional Short Term Goals  Yes      PT SHORT TERM GOAL #6   Title  Patient demonstrates proper lifting & carrying 25# and stationary cycling with prosthesis.    Time  4    Period  Weeks    Status  New    Target Date  03/17/20        PT Long Term Goals - 02/22/20 2136      PT LONG TERM GOAL #1   Title  Patient tolerates wear of prosthesis >90% of awake hours with skin or limb pain issues to enable function throughout his day.  (All LTGs Target Date: 04/14/2020)    Time  8    Period  Weeks    Status  New    Target Date  04/14/20      PT LONG TERM GOAL #2   Title  Patient verbalizes & demonstrates understanding of proper prosthetic care to enable safe use of prosthesis.    Time  8    Period  Weeks  Status  New    Target Date  04/14/20      PT LONG TERM GOAL #3   Title  Functional Gait Assessment >20/30 with prosthesis only.    Time  8    Period  Weeks    Status  New    Target Date  04/14/20      PT LONG TERM GOAL #4   Title  Patient ambulates >1000' with prosthesis only  including ramps, curbs & stairs independently for full community mobility.    Time  8    Period  Weeks    Status  New    Target Date  04/14/20      PT LONG TERM GOAL #5   Title  Patient demonstrates ability to lift, carry, ride bike and perform prior functional activities with prosthesis safely.    Time  8    Period  Weeks    Status  New    Target Date  04/14/20            Plan - 02/26/20 1545    Clinical Impression Statement  Today's session focused on progression of prosthetic education in addition to initiation of static balance weight shifting. He responded well to today's interventions with no reports of pain or discomfort, but reports marked fatigue in right lower extremity following approximately 10 mintues of weight bearing/weight shifting. He will benefit from continued PT in order to maximize his functional mobility and decrease risk of falls.    Personal Factors and Comorbidities  Comorbidity 2;Time since onset of injury/illness/exacerbation    Comorbidities  Rt TTA, Herpes Zoster, right radius fx, pulmonary nodule,    Examination-Activity Limitations  Lift;Locomotion Level;Squat;Stairs;Stand;Transfers    Examination-Participation Restrictions  Community Activity;Yard Work;Other   exercising & high activity level   Stability/Clinical Decision Making  Stable/Uncomplicated    Rehab Potential  Good    PT Frequency  2x / week    PT Duration  8 weeks    PT Treatment/Interventions  ADLs/Self Care Home Management;Electrical Stimulation;DME Instruction;Gait training;Stair training;Functional mobility training;Therapeutic activities;Therapeutic exercise;Balance training;Neuromuscular re-education;Patient/family education;Prosthetic Training;Manual techniques;Vestibular    PT Next Visit Plan  review prosthetic care, progress weight shifting and balance activities, initiate LE strengthening with update(s) to HEP as indicated    Consulted and Agree with Plan of Care  Patient        Patient will benefit from skilled therapeutic intervention in order to improve the following deficits and impairments:  Abnormal gait, Decreased activity tolerance, Decreased balance, Decreased knowledge of use of DME, Decreased mobility, Decreased skin integrity, Postural dysfunction, Prosthetic Dependency, Pain  Visit Diagnosis: Unsteadiness on feet  Other abnormalities of gait and mobility  Difficulty in walking, not elsewhere classified  Abnormal posture     Problem List Patient Active Problem List   Diagnosis Date Noted  . Gangrene of right foot ([place]) 12/04/2019  . Subacute osteomyelitis, right ankle and foot ([place])   . Pilon fracture of right tibia, sequela 06/10/2019  . Displaced pilon fracture of right tibia, initial encounter for open fracture type IIIA, IIIB, or IIIC   . Subacute osteomyelitis of right tibia ([place])   . Surgery, elective   . Anxiety   . Open pilon fracture, right, type III, initial encounter 03/11/2019  . Closed fracture of right distal radius 03/11/2019  . Type III open pilon fracture of right tibia 03/11/2019  . Pounding heartbeat 01/21/2019  . Precordial chest pain 03/05/2014    [place], Saphire Barnhart, DPT  02/26/2020, 3:56 PM  [facility] [address] [place], [place], [address] Phone: [phone]   Fax:  [phone]  Name: Jonathan Bass MRN: [id number] Date of Birth: [birth year]

## 2020-02-29 ENCOUNTER — Ambulatory Visit (INDEPENDENT_AMBULATORY_CARE_PROVIDER_SITE_OTHER): Payer: BC Managed Care – PPO | Admitting: Physical Therapy

## 2020-02-29 ENCOUNTER — Other Ambulatory Visit: Payer: Self-pay

## 2020-02-29 DIAGNOSIS — G546 Phantom limb syndrome with pain: Secondary | ICD-10-CM | POA: Diagnosis not present

## 2020-02-29 DIAGNOSIS — R2689 Other abnormalities of gait and mobility: Secondary | ICD-10-CM

## 2020-02-29 DIAGNOSIS — R2681 Unsteadiness on feet: Secondary | ICD-10-CM | POA: Diagnosis not present

## 2020-02-29 NOTE — Therapy (Signed)
[facility] [address] [place], [place], [address] Phone: [phone]   Fax:  [phone]  Physical Therapy Treatment  Patient Details  Name: Jonathan Bass MRN: [id number] Date of Birth: May 18, 1961 Referring Provider (PT): Meridee Score, MD   Encounter Date: 02/29/2020  PT End of Session - 02/29/20 1548    Visit Number  3    Number of Visits  16    Authorization Type  BCBS    Authorization Time Period  Ded MET OOP Met $0 co-pay    PT Start Time  1450    PT Stop Time  1535    PT Time Calculation (min)  45 min    Equipment Utilized During Treatment  Gait belt    Activity Tolerance  Patient tolerated treatment well    Behavior During Therapy  WFL for tasks assessed/performed       Past Medical History:  Diagnosis Date  . Anxiety   . GERD (gastroesophageal reflux disease)    02/21/2019- not current   . Pulmonary nodule 10/2014   Noted on CT scan -> stable and follow-up 2019.  No further evaluation necessary.    Past Surgical History:  Procedure Laterality Date  . AMPUTATION Right 12/04/2019   Procedure: RIGHT BELOW KNEE AMPUTATION;  Surgeon: Newt Minion, MD;  Location: [place];  Service: Orthopedics;  Laterality: Right;  . APPLICATION OF WOUND VAC Right 06/10/2019   Procedure: Application Of Wound Vac -Prevena;  Surgeon: Newt Minion, MD;  Location: [place];  Service: Orthopedics;  Laterality: Right;  . CARDIAC EVENT MONITOR  01/2019   redominantly NSR with average rate 60 bpm.  One short 4 beat run of NSVT.  Minimal PACs and PVCs noted.  No sustained arrhythmia.  Marland Kitchen CLOSED REDUCTION WRIST FRACTURE Right 03/11/2019   Procedure: CLOSED REDUCTION WRIST;  Surgeon: Shona Needles, MD;  Location: [place];  Service: Orthopedics;  Laterality: Right;  . COLONOSCOPY    . EXTERNAL FIXATION LEG Right 03/11/2019   Procedure: EXTERNAL FIXATION LEG;  Surgeon: Shona Needles, MD;  Location: [place];  Service: Orthopedics;  Laterality: Right;  . EXTERNAL  FIXATION REMOVAL Right 05/08/2019   Procedure: REMOVAL EXTERNAL FIXATION ANKLE, PLACEMENT OF CAST;  Surgeon: Shona Needles, MD;  Location: [place];  Service: Orthopedics;  Laterality: Right;  . FOOT SURGERY Bilateral    hammer toe  . HERNIA REPAIR Bilateral 2009   Inguinial  . I & D EXTREMITY Right 03/11/2019   Procedure: IRRIGATION AND DEBRIDEMENT EXTREMITY;  Surgeon: Shona Needles, MD;  Location: [place];  Service: Orthopedics;  Laterality: Right;  . IR FLUORO GUIDE CV LINE RIGHT  07/09/2019  . OPEN REDUCTION INTERNAL FIXATION (ORIF) TIBIA/FIBULA FRACTURE Right 06/10/2019   Procedure: EXCISION RIGHT TIBIA AND FIBULA, WOUND DEBRIDEMENT, AND FIXATION OF TIBIA AND FIBULA;  Surgeon: Newt Minion, MD;  Location: [place];  Service: Orthopedics;  Laterality: Right;  . OPEN REDUCTION INTERNAL FIXATION (ORIF) TIBIA/FIBULA FRACTURE Right 06/12/2019   Procedure: DEBRIDEMENT AND REVISION INTERNAL FIXATION INFECTED RIGHT PILON FRACTURE;  Surgeon: Newt Minion, MD;  Location: [place];  Service: Orthopedics;  Laterality: Right;  . ORIF ANKLE FRACTURE Right 03/11/2019   Procedure: OPEN REDUCTION INTERNAL FIXATION (ORIF) ANKLE FRACTURE;  Surgeon: Shona Needles, MD;  Location: [place];  Service: Orthopedics;  Laterality: Right;  . ORIF WRIST FRACTURE Right 03/11/2019   Procedure: OPEN REDUCTION INTERNAL FIXATION (ORIF) WRIST FRACTURE;  Surgeon: Shona Needles, MD;  Location: [facility]  OR;  Service: Orthopedics;  Laterality: Right;  . TONSILLECTOMY    . TRANSTHORACIC ECHOCARDIOGRAM  01/2019   EF 55 to 60%.  Normal wall motion.  Indeterminate likely grade 1 diastolic function.  Mild degenerative mitral valve disease with thickening but no prolapse.    There were no vitals filed for this visit.  Subjective Assessment - 02/29/20 1546    Subjective  Reports wearing his prosthetic 3 hours a day 3  times per day. he is having some trouble getting his socks right.    Limitations  Lifting;Standing;Walking;House hold activities     Patient Stated Goals  He would like to return to pre injury exercising (weights & cardio / running, elliptical, biking), hiking, road cycling, boating / water ski, softball,    Pain Onset  More than a month ago         [facility] PT Assessment - 02/29/20 0001      Transfers   Transfers  Sit to Stand;Stand to Sit    Sit to Stand  7: Independent;Without upper extremity assist;From chair/3-in-1    Stand to Sit  7: Independent;Without upper extremity assist;To chair/3-in-1      Ambulation/Gait   Ambulation/Gait  Yes    Ambulation/Gait Assistance  5: Supervision    Ambulation Distance (Feet)  150 Feet   X3   Assistive device  Prosthesis   no AD today   Gait Pattern  Step-through pattern;Decreased arm swing - right;Decreased stance time - right;Decreased step length - left;Decreased hip/knee flexion - right;Decreased weight shift to right;Right hip hike;Right flexed knee in stance;Antalgic;Abducted- right    Ambulation Surface  Level;Indoor      High Level Balance   High Level Balance Comments  lateral walking and retro walking with green band around feet in bars up/down X3, march walking up/down X 3       [facility] Adult PT Treatment/Exercise - 02/29/20 0001      Exercises   Exercises  Knee/Hip      Knee/Hip Exercises: Standing   Other Standing Knee Exercises  step ups onto 6 inch step with Rt leg fwd and lateral X 15 reps ea, intermitt one UE support PRN    Other Standing Knee Exercises  hip 4 way with green band X 20 reps bilat             PT Education - 02/29/20 1547    Education Details  HEP progression with hip strength and dynamic gait progression    Person(s) Educated  Patient    Methods  Explanation;Demonstration;Verbal cues;Handout    Comprehension  Verbalized understanding       PT Short Term Goals - 02/22/20 2145      PT SHORT TERM GOAL #1   Title  Patient tolerates prosthesis wear >12hrs/ day with no increase in wound size.    Time  4    Period  Weeks     Status  New    Target Date  03/17/20      PT SHORT TERM GOAL #2   Title  Patient verbalizes proper sweat management and phantom pain management.    Time  4    Period  Weeks    Status  New    Target Date  03/17/20      PT SHORT TERM GOAL #3   Title  Berg Balance >52/56 indicating low fall risk.    Time  4    Period  Weeks    Status  New    Target  Date  03/17/20      PT SHORT TERM GOAL #4   Title  Patient ambulates 500' with prosthesis only with supervision / verbal cues only.    Time  4    Period  Weeks    Status  New    Target Date  03/17/20      PT SHORT TERM GOAL #5   Title  Patient negotiates ramps, curbs & stairs with single rail reciprocally with supervision.    Time  4    Period  Weeks    Status  New    Target Date  03/17/20      Additional Short Term Goals   Additional Short Term Goals  Yes      PT SHORT TERM GOAL #6   Title  Patient demonstrates proper lifting & carrying 25# and stationary cycling with prosthesis.    Time  4    Period  Weeks    Status  New    Target Date  03/17/20        PT Long Term Goals - 02/22/20 2136      PT LONG TERM GOAL #1   Title  Patient tolerates wear of prosthesis >90% of awake hours with skin or limb pain issues to enable function throughout his day.  (All LTGs Target Date: 04/14/2020)    Time  8    Period  Weeks    Status  New    Target Date  04/14/20      PT LONG TERM GOAL #2   Title  Patient verbalizes & demonstrates understanding of proper prosthetic care to enable safe use of prosthesis.    Time  8    Period  Weeks    Status  New    Target Date  04/14/20      PT LONG TERM GOAL #3   Title  Functional Gait Assessment >20/30 with prosthesis only.    Time  8    Period  Weeks    Status  New    Target Date  04/14/20      PT LONG TERM GOAL #4   Title  Patient ambulates >1000' with prosthesis only including ramps, curbs & stairs independently for full community mobility.    Time  8    Period  Weeks    Status  New     Target Date  04/14/20      PT LONG TERM GOAL #5   Title  Patient demonstrates ability to lift, carry, ride bike and perform prior functional activities with prosthesis safely.    Time  8    Period  Weeks    Status  New    Target Date  04/14/20            Plan - 02/29/20 1548    Clinical Impression Statement  He is having to use 17 layers thickness for his socks, this may be too much so was encouraged to ask prosthetist about this when he sees them tommorow. Overall he is doing well in terms of pain and activity tolerance. HEP was progressed today to incorporate more hip and dynamic balance activities. He showed good tolerance with these. Continue POC.    Personal Factors and Comorbidities  Comorbidity 2;Time since onset of injury/illness/exacerbation    Comorbidities  Rt TTA, Herpes Zoster, right radius fx, pulmonary nodule,    Examination-Activity Limitations  Lift;Locomotion Level;Squat;Stairs;Stand;Transfers    Examination-Participation Restrictions  Community Activity;Yard Work;Other   exercising & high activity level  Stability/Clinical Decision Making  Stable/Uncomplicated    Rehab Potential  Good    PT Frequency  2x / week    PT Duration  8 weeks    PT Treatment/Interventions  ADLs/Self Care Home Management;Electrical Stimulation;DME Instruction;Gait training;Stair training;Functional mobility training;Therapeutic activities;Therapeutic exercise;Balance training;Neuromuscular re-education;Patient/family education;Prosthetic Training;Manual techniques;Vestibular    PT Next Visit Plan  review prosthetic care, progress weight shifting and balance activities, initiate LE strengthening with update(s) to HEP as indicated    Consulted and Agree with Plan of Care  Patient       Patient will benefit from skilled therapeutic intervention in order to improve the following deficits and impairments:  Abnormal gait, Decreased activity tolerance, Decreased balance, Decreased knowledge  of use of DME, Decreased mobility, Decreased skin integrity, Postural dysfunction, Prosthetic Dependency, Pain  Visit Diagnosis: Unsteadiness on feet  Other abnormalities of gait and mobility  Phantom limb syndrome with pain [facility])     Problem List Patient Active Problem List   Diagnosis Date Noted  . Gangrene of right foot ([place]) 12/04/2019  . Subacute osteomyelitis, right ankle and foot ([place])   . Pilon fracture of right tibia, sequela 06/10/2019  . Displaced pilon fracture of right tibia, initial encounter for open fracture type IIIA, IIIB, or IIIC   . Subacute osteomyelitis of right tibia ([place])   . Surgery, elective   . Anxiety   . Open pilon fracture, right, type III, initial encounter 03/11/2019  . Closed fracture of right distal radius 03/11/2019  . Type III open pilon fracture of right tibia 03/11/2019  . Pounding heartbeat 01/21/2019  . Precordial chest pain 03/05/2014    Debbe Odea, PT,DPT 02/29/2020, 3:55 PM  [facility] [address] [place], [place], [address] Phone: [phone]   Fax:  [phone]  Name: CARMERON HEADY MRN: [id number] Date of Birth: 10/03/1961

## 2020-02-29 NOTE — Patient Instructions (Signed)
Access Code: [email or web address]: [email or web address]: 03/15/2021Prepared by: Arlys John NelsonExercises  Standing Hip Extension with Anchored Resistance - 2 x daily - 6 x weekly - 10 reps - 3 sets  Standing Hip Adduction with Anchored Resistance - 2 x daily - 6 x weekly - 10 reps - 3 sets  Standing Hip Abduction with Anchored Resistance - 2 x daily - 6 x weekly - 10 reps - 3 sets  Standing Hip Flexion with Anchored Resistance - 2 x daily - 6 x weekly - 10 reps - 3 sets  Step Up - 2 x daily - 6 x weekly - 10 reps - 1-2 sets  Lateral Step Up - 2 x daily - 6 x weekly - 10 reps - 2 sets  Walking March - 2 x daily - 6 x weekly - 10 reps - 3 sets  Side Stepping with Resistance at Ankles - 2 x daily - 6 x weekly - 10 reps - 3 sets

## 2020-03-03 ENCOUNTER — Other Ambulatory Visit: Payer: Self-pay

## 2020-03-03 ENCOUNTER — Ambulatory Visit (INDEPENDENT_AMBULATORY_CARE_PROVIDER_SITE_OTHER): Payer: BC Managed Care – PPO | Admitting: Physical Therapy

## 2020-03-03 DIAGNOSIS — R262 Difficulty in walking, not elsewhere classified: Secondary | ICD-10-CM | POA: Diagnosis not present

## 2020-03-03 DIAGNOSIS — M25671 Stiffness of right ankle, not elsewhere classified: Secondary | ICD-10-CM

## 2020-03-03 DIAGNOSIS — R2681 Unsteadiness on feet: Secondary | ICD-10-CM | POA: Diagnosis not present

## 2020-03-03 DIAGNOSIS — G546 Phantom limb syndrome with pain: Secondary | ICD-10-CM

## 2020-03-03 DIAGNOSIS — R600 Localized edema: Secondary | ICD-10-CM

## 2020-03-03 DIAGNOSIS — R2689 Other abnormalities of gait and mobility: Secondary | ICD-10-CM | POA: Diagnosis not present

## 2020-03-03 DIAGNOSIS — R293 Abnormal posture: Secondary | ICD-10-CM

## 2020-03-03 NOTE — Therapy (Signed)
[facility] [address] [place], [place], [address] Phone: [phone]   Fax:  [phone]  Physical Therapy Treatment  Patient Details  Name: Jonathan Bass MRN: [id number] Date of Birth: Aug 28, 1961 Referring Provider (PT): Meridee Score, MD   Encounter Date: 03/03/2020  PT End of Session - 03/03/20 1634    Visit Number  4    Number of Visits  16    Authorization Type  BC[employer]    Authorization Time Period  Ded MET OOP Met $0 co-pay    PT Start Time  1962    PT Stop Time  1630    PT Time Calculation (min)  55 min    Equipment Utilized During Treatment  Gait belt    Activity Tolerance  Patient tolerated treatment well    Behavior During Therapy  WFL for tasks assessed/performed       Past Medical History:  Diagnosis Date  . Anxiety   . GERD (gastroesophageal reflux disease)    02/21/2019- not current   . Pulmonary nodule 10/2014   Noted on CT scan -> stable and follow-up 2019.  No further evaluation necessary.    Past Surgical History:  Procedure Laterality Date  . AMPUTATION Right 12/04/2019   Procedure: RIGHT BELOW KNEE AMPUTATION;  Surgeon: Newt Minion, MD;  Location: [place];  Service: Orthopedics;  Laterality: Right;  . APPLICATION OF WOUND VAC Right 06/10/2019   Procedure: Application Of Wound Vac -Prevena;  Surgeon: Newt Minion, MD;  Location: [place];  Service: Orthopedics;  Laterality: Right;  . CARDIAC EVENT MONITOR  01/2019   redominantly NSR with average rate 60 bpm.  One short 4 beat run of NSVT.  Minimal PACs and PVCs noted.  No sustained arrhythmia.  Marland Kitchen CLOSED REDUCTION WRIST FRACTURE Right 03/11/2019   Procedure: CLOSED REDUCTION WRIST;  Surgeon: Shona Needles, MD;  Location: [place];  Service: Orthopedics;  Laterality: Right;  . COLONOSCOPY    . EXTERNAL FIXATION LEG Right 03/11/2019   Procedure: EXTERNAL FIXATION LEG;  Surgeon: Shona Needles, MD;  Location: [place];  Service: Orthopedics;  Laterality: Right;  . EXTERNAL  FIXATION REMOVAL Right 05/08/2019   Procedure: REMOVAL EXTERNAL FIXATION ANKLE, PLACEMENT OF CAST;  Surgeon: Shona Needles, MD;  Location: [place];  Service: Orthopedics;  Laterality: Right;  . FOOT SURGERY Bilateral    hammer toe  . HERNIA REPAIR Bilateral 2009   Inguinial  . I & D EXTREMITY Right 03/11/2019   Procedure: IRRIGATION AND DEBRIDEMENT EXTREMITY;  Surgeon: Shona Needles, MD;  Location: [place];  Service: Orthopedics;  Laterality: Right;  . IR FLUORO GUIDE CV LINE RIGHT  07/09/2019  . OPEN REDUCTION INTERNAL FIXATION (ORIF) TIBIA/FIBULA FRACTURE Right 06/10/2019   Procedure: EXCISION RIGHT TIBIA AND FIBULA, WOUND DEBRIDEMENT, AND FIXATION OF TIBIA AND FIBULA;  Surgeon: Newt Minion, MD;  Location: [place];  Service: Orthopedics;  Laterality: Right;  . OPEN REDUCTION INTERNAL FIXATION (ORIF) TIBIA/FIBULA FRACTURE Right 06/12/2019   Procedure: DEBRIDEMENT AND REVISION INTERNAL FIXATION INFECTED RIGHT PILON FRACTURE;  Surgeon: Newt Minion, MD;  Location: [place];  Service: Orthopedics;  Laterality: Right;  . ORIF ANKLE FRACTURE Right 03/11/2019   Procedure: OPEN REDUCTION INTERNAL FIXATION (ORIF) ANKLE FRACTURE;  Surgeon: Shona Needles, MD;  Location: [place];  Service: Orthopedics;  Laterality: Right;  . ORIF WRIST FRACTURE Right 03/11/2019   Procedure: OPEN REDUCTION INTERNAL FIXATION (ORIF) WRIST FRACTURE;  Surgeon: Shona Needles, MD;  Location: [facility];  Service: Orthopedics;  Laterality: Right;  . TONSILLECTOMY    . TRANSTHORACIC ECHOCARDIOGRAM  01/2019   EF 55 to 60%.  Normal wall motion.  Indeterminate likely grade 1 diastolic function.  Mild degenerative mitral valve disease with thickening but no prolapse.    There were no vitals filed for this visit.  Subjective Assessment - 03/03/20 1633    Subjective  reports [employer] added pads to his socket. He is still having a challenge figuring out how many socks to use and to shift weight fully back on his heel.    Limitations   Lifting;Standing;Walking;House hold activities    Patient Stated Goals  He would like to return to pre injury exercising (weights & cardio / running, elliptical, biking), hiking, road cycling, boating / water ski, softball,    Pain Onset  More than a month ago          [facility] Adult PT Treatment/Exercise - 03/03/20 0001      Ambulation/Gait   Ambulation/Gait  Yes    Ambulation/Gait Assistance  5: Supervision    Ambulation Distance (Feet)  150 Feet   X2   Assistive device  Prosthesis    Gait Pattern  Step-through pattern;Decreased arm swing - right;Decreased stance time - right;Decreased step length - left;Decreased hip/knee flexion - right;Decreased weight shift to right;Right hip hike;Right flexed knee in stance;Antalgic;Abducted- right    Ambulation Surface  Level;Indoor      High Level Balance   High Level Balance Comments  balance on rocker board for anterior-posterior X 20 reps with fingertip UE support      Knee/Hip Exercises: Stretches   Other Knee/Hip Stretches  quad/hip flexor stretch leg off EOB pulling with strap 60 sec X 2, then prone with pillow under thigh 30 sec X 2 pulling with strap    Other Knee/Hip Stretches  LTR 10 sec X 5 bilat      Knee/Hip Exercises: Machines for Strengthening   Total Gym Leg Press  75 lbs for Rt leg push X 20 reps then dropped to 56 lbs for last 2 sets of 20      Knee/Hip Exercises: Seated   Long Arc Quad  Right;3 sets;15 reps    Long Arc Quad Weight  5 lbs.    Sit to Sand  15 reps;without UE support   Lt leg further in front for more wt shift to Rt     Knee/Hip Exercises: Supine   Bridges Limitations  SL bridges X 20 each side      Knee/Hip Exercises: Prone   Hip Extension  Both;2 sets;10 reps               PT Short Term Goals - 02/22/20 2145      PT SHORT TERM GOAL #1   Title  Patient tolerates prosthesis wear >12hrs/ day with no increase in wound size.    Time  4    Period  Weeks    Status  New    Target Date   03/17/20      PT SHORT TERM GOAL #2   Title  Patient verbalizes proper sweat management and phantom pain management.    Time  4    Period  Weeks    Status  New    Target Date  03/17/20      PT SHORT TERM GOAL #3   Title  Berg Balance >52/56 indicating low fall risk.    Time  4    Period  Weeks  Status  New    Target Date  03/17/20      PT SHORT TERM GOAL #4   Title  Patient ambulates 500' with prosthesis only with supervision / verbal cues only.    Time  4    Period  Weeks    Status  New    Target Date  03/17/20      PT SHORT TERM GOAL #5   Title  Patient negotiates ramps, curbs & stairs with single rail reciprocally with supervision.    Time  4    Period  Weeks    Status  New    Target Date  03/17/20      Additional Short Term Goals   Additional Short Term Goals  Yes      PT SHORT TERM GOAL #6   Title  Patient demonstrates proper lifting & carrying 25# and stationary cycling with prosthesis.    Time  4    Period  Weeks    Status  New    Target Date  03/17/20        PT Long Term Goals - 02/22/20 2136      PT LONG TERM GOAL #1   Title  Patient tolerates wear of prosthesis >90% of awake hours with skin or limb pain issues to enable function throughout his day.  (All LTGs Target Date: 04/14/2020)    Time  8    Period  Weeks    Status  New    Target Date  04/14/20      PT LONG TERM GOAL #2   Title  Patient verbalizes & demonstrates understanding of proper prosthetic care to enable safe use of prosthesis.    Time  8    Period  Weeks    Status  New    Target Date  04/14/20      PT LONG TERM GOAL #3   Title  Functional Gait Assessment >20/30 with prosthesis only.    Time  8    Period  Weeks    Status  New    Target Date  04/14/20      PT LONG TERM GOAL #4   Title  Patient ambulates >1000' with prosthesis only including ramps, curbs & stairs independently for full community mobility.    Time  8    Period  Weeks    Status  New    Target Date  04/14/20       PT LONG TERM GOAL #5   Title  Patient demonstrates ability to lift, carry, ride bike and perform prior functional activities with prosthesis safely.    Time  8    Period  Weeks    Status  New    Target Date  04/14/20            Plan - 03/03/20 1635    Clinical Impression Statement  Session focused on stretching for hip/flexors/quads/lumbar, along with progressing his overall Rt knee strength as tolerated. He shows good tolerance to session without complaints. Added rocker board balance for further anterior-posterior hip/ankle strategy challenge. Pt did not notice any pressure areas on his limb from the pads that were added to socket. Continue POC.    Personal Factors and Comorbidities  Comorbidity 2;Time since onset of injury/illness/exacerbation    Comorbidities  Rt TTA, Herpes Zoster, right radius fx, pulmonary nodule,    Examination-Activity Limitations  Lift;Locomotion Level;Squat;Stairs;Stand;Transfers    Examination-Participation Restrictions  Community Activity;Yard Work;Other   exercising & high activity level  Stability/Clinical Decision Making  Stable/Uncomplicated    Rehab Potential  Good    PT Frequency  2x / week    PT Duration  8 weeks    PT Treatment/Interventions  ADLs/Self Care Home Management;Electrical Stimulation;DME Instruction;Gait training;Stair training;Functional mobility training;Therapeutic activities;Therapeutic exercise;Balance training;Neuromuscular re-education;Patient/family education;Prosthetic Training;Manual techniques;Vestibular    PT Next Visit Plan  review prosthetic care, progress weight shifting and balance activities, initiate LE strengthening with update(s) to HEP as indicated    Consulted and Agree with Plan of Care  Patient       Patient will benefit from skilled therapeutic intervention in order to improve the following deficits and impairments:  Abnormal gait, Decreased activity tolerance, Decreased balance, Decreased knowledge of  use of DME, Decreased mobility, Decreased skin integrity, Postural dysfunction, Prosthetic Dependency, Pain  Visit Diagnosis: Unsteadiness on feet  Other abnormalities of gait and mobility  Phantom limb syndrome with pain (HCC)  Difficulty in walking, not elsewhere classified  Abnormal posture  Stiffness of right ankle, not elsewhere classified  Localized edema     Problem List Patient Active Problem List   Diagnosis Date Noted  . Gangrene of right foot ([place]) 12/04/2019  . Subacute osteomyelitis, right ankle and foot ([place] [place] [facility])   . Pilon fracture of right tibia, sequela 06/10/2019  . Displaced pilon fracture of right tibia, initial encounter for open fracture type IIIA, IIIB, or IIIC   . Subacute osteomyelitis of right tibia ([place])   . Surgery, elective   . Anxiety   . Open pilon fracture, right, type III, initial encounter 03/11/2019  . Closed fracture of right distal radius 03/11/2019  . Type III open pilon fracture of right tibia 03/11/2019  . Pounding heartbeat 01/21/2019  . Precordial chest pain 03/05/2014    Debbe Odea, PT,DPT 03/03/2020, 4:37 PM  [facility] [address] [place], [place], [address] Phone: [phone]   Fax:  [phone]  Name: IZAYIAH TIBBITTS MRN: [id number] Date of Birth: 08/19/1961

## 2020-03-07 ENCOUNTER — Other Ambulatory Visit: Payer: Self-pay

## 2020-03-07 ENCOUNTER — Encounter: Payer: Self-pay | Admitting: Physical Therapy

## 2020-03-07 ENCOUNTER — Ambulatory Visit (INDEPENDENT_AMBULATORY_CARE_PROVIDER_SITE_OTHER): Payer: BC Managed Care – PPO | Admitting: Physical Therapy

## 2020-03-07 DIAGNOSIS — R2689 Other abnormalities of gait and mobility: Secondary | ICD-10-CM

## 2020-03-07 DIAGNOSIS — R2681 Unsteadiness on feet: Secondary | ICD-10-CM | POA: Diagnosis not present

## 2020-03-07 DIAGNOSIS — R293 Abnormal posture: Secondary | ICD-10-CM

## 2020-03-07 DIAGNOSIS — R531 Weakness: Secondary | ICD-10-CM | POA: Diagnosis not present

## 2020-03-07 DIAGNOSIS — R600 Localized edema: Secondary | ICD-10-CM

## 2020-03-07 NOTE — Therapy (Signed)
[facility] [address] [place], [place], [address] Phone: [phone]   Fax:  [phone]  Physical Therapy Treatment  Patient Details  Name: Jonathan Bass MRN: [id number] Date of Birth: [birth year] Referring Provider (PT): Meridee Score, MD   Encounter Date: 03/07/2020  PT End of Session - 03/07/20 0954    Visit Number  5    Number of Visits  16    Authorization Type  BCBS    Authorization Time Period  Ded MET OOP Met $0 co-pay    PT Start Time  0825    PT Stop Time  0925    PT Time Calculation (min)  60 min    Equipment Utilized During Treatment  Gait belt    Activity Tolerance  Patient tolerated treatment well    Behavior During Therapy  [facility] for tasks assessed/performed       Past Medical History:  Diagnosis Date  . Anxiety   . GERD (gastroesophageal reflux disease)    02/21/2019- not current   . Pulmonary nodule 10/2014   Noted on CT scan -> stable and follow-up 2019.  No further evaluation necessary.    Past Surgical History:  Procedure Laterality Date  . AMPUTATION Right 12/04/2019   Procedure: RIGHT BELOW KNEE AMPUTATION;  Surgeon: Newt Minion, MD;  Location: [place];  Service: Orthopedics;  Laterality: Right;  . APPLICATION OF WOUND VAC Right 06/10/2019   Procedure: Application Of Wound Vac -Prevena;  Surgeon: Newt Minion, MD;  Location: [place];  Service: Orthopedics;  Laterality: Right;  . CARDIAC EVENT MONITOR  01/2019   redominantly NSR with average rate 60 bpm.  One short 4 beat run of NSVT.  Minimal PACs and PVCs noted.  No sustained arrhythmia.  Marland Kitchen CLOSED REDUCTION WRIST FRACTURE Right 03/11/2019   Procedure: CLOSED REDUCTION WRIST;  Surgeon: Shona Needles, MD;  Location: [place];  Service: Orthopedics;  Laterality: Right;  . COLONOSCOPY    . EXTERNAL FIXATION LEG Right 03/11/2019   Procedure: EXTERNAL FIXATION LEG;  Surgeon: Shona Needles, MD;  Location: [place];  Service: Orthopedics;  Laterality: Right;  . EXTERNAL  FIXATION REMOVAL Right 05/08/2019   Procedure: REMOVAL EXTERNAL FIXATION ANKLE, PLACEMENT OF CAST;  Surgeon: Shona Needles, MD;  Location: [place];  Service: Orthopedics;  Laterality: Right;  . FOOT SURGERY Bilateral    hammer toe  . HERNIA REPAIR Bilateral 2009   Inguinial  . I & D EXTREMITY Right 03/11/2019   Procedure: IRRIGATION AND DEBRIDEMENT EXTREMITY;  Surgeon: Shona Needles, MD;  Location: [place];  Service: Orthopedics;  Laterality: Right;  . IR FLUORO GUIDE CV LINE RIGHT  07/09/2019  . OPEN REDUCTION INTERNAL FIXATION (ORIF) TIBIA/FIBULA FRACTURE Right 06/10/2019   Procedure: EXCISION RIGHT TIBIA AND FIBULA, WOUND DEBRIDEMENT, AND FIXATION OF TIBIA AND FIBULA;  Surgeon: Newt Minion, MD;  Location: [place];  Service: Orthopedics;  Laterality: Right;  . OPEN REDUCTION INTERNAL FIXATION (ORIF) TIBIA/FIBULA FRACTURE Right 06/12/2019   Procedure: DEBRIDEMENT AND REVISION INTERNAL FIXATION INFECTED RIGHT PILON FRACTURE;  Surgeon: Newt Minion, MD;  Location: [place];  Service: Orthopedics;  Laterality: Right;  . ORIF ANKLE FRACTURE Right 03/11/2019   Procedure: OPEN REDUCTION INTERNAL FIXATION (ORIF) ANKLE FRACTURE;  Surgeon: Shona Needles, MD;  Location: [place];  Service: Orthopedics;  Laterality: Right;  . ORIF WRIST FRACTURE Right 03/11/2019   Procedure: OPEN REDUCTION INTERNAL FIXATION (ORIF) WRIST FRACTURE;  Surgeon: Shona Needles, MD;  Location: [facility]  OR;  Service: Orthopedics;  Laterality: Right;  . TONSILLECTOMY    . TRANSTHORACIC ECHOCARDIOGRAM  01/2019   EF 55 to 60%.  Normal wall motion.  Indeterminate likely grade 1 diastolic function.  Mild degenerative mitral valve disease with thickening but no prolapse.    There were no vitals filed for this visit.  Subjective Assessment - 03/07/20 0826    Subjective  He is doing better with figuring the socks & accomodating pads.  He feels that he needs to see prosthetist as feels too short.    Limitations  Lifting;Standing;Walking;House  hold activities    Patient Stated Goals  He would like to return to pre injury exercising (weights & cardio / running, elliptical, biking), hiking, road cycling, boating / water ski, softball,    Currently in Pain?  No/denies    Pain Onset  More than a month ago                       [facility] Adult PT Treatment/Exercise - 03/07/20 0830      Ambulation/Gait   Ambulation/Gait  Yes    Ambulation/Gait Assistance  5: Supervision    Ambulation/Gait Assistance Details  demo, verbal & tactile cues on upright posture, step width, wt shift from pelvis & stance duration/symmetry.     Ambulation Distance (Feet)  500 Feet   >500'    Assistive device  Prosthesis    Gait Pattern  Step-through pattern;Decreased arm swing - right;Decreased stance time - right;Decreased step length - left;Decreased hip/knee flexion - right;Decreased weight shift to right;Right hip hike;Right flexed knee in stance;Antalgic;Abducted- right    Ambulation Surface  Indoor;Level      High Level Balance   High Level Balance Activities  Negotiating over obstacles    High Level Balance Comments  PT demo & verbal cues on technique with TTA prosthesis.       Therapeutic Activites    Therapeutic Activities  Lifting;Work Librarian, academic & verbal cues on technique to lift with TTA prosthesis including 18# box with goal equal weight & symmetry of trunk which may have to offset prosthetic foot.  Pt return demo understanding with verbal cues.     Work Radiation protection practitioner & verbal cues on working with lines/hoses with TTA prosthesis and potential trip hazard if line is resting on top of prosthetic foot.  Pt verbalized understanding.       Knee/Hip Exercises: Stretches   Other Knee/Hip Stretches  --    Other Knee/Hip Stretches  --      Knee/Hip Exercises: Aerobic   Stationary Bike  Level 5 for 10 minutes to simulate activity at home assessing how he is doing. Pt performed while PT checked him into appt and  educated on sweat management.     Tread Mill  2.5-2.7 mph for 5 min with verbal cues on safety with treadmill use, focusing on one thing at time including upright posture, stance duration & step length / reaching with both heels forward.  He was able to progress from BUE support to LUE only. But when realeassed BUEs he had increased gait deviations. PT advised treadmill more about gait quality than getting a workout for now.  Pt verbalized understanding..       Knee/Hip Exercises: Machines for Strengthening   Total Gym Leg Press  --      Knee/Hip Exercises: Standing   Functional Squat  10 reps   8# ea UE  Functional Squat Limitations  Pt cues on back & LE equality with TTA prosthesis. Pt verbalized & return demo understanding.       Knee/Hip Exercises: Seated   Long Arc Quad  --    Long Arc [employer]  --    Sit to [employer]  --      Knee/Hip Exercises: Supine   Bridges Limitations  --      Knee/Hip Exercises: Prone   Hip Extension  --      Shoulder Exercises: Seated   Other Seated Exercises  overhead press with 8# ea UE with verbal cues on equality of lift by shifting prosthetic foot position.  Pt return demo & verbalized understanding.       Prosthetics   Prosthetic Care Comments   Sweat management with [employer] strength or spray (due to hair amount on his limb) nightly & after bathing. Weekly use of Sweat Block wipes at night.  Need to manage sweat to decrease risk of skin issues.  He will need prosthetist to add pads & adjust alignment as his limb & gait change.  He plans to schedule another appt this week for more adjustments & check height.     Current prosthetic wear tolerance (days/week)   daily    Current prosthetic wear tolerance (#hours/day)   most of awake hours, removing to dry     Current prosthetic weight-bearing tolerance (hours/day)   tolerated standing & gait >30 minutes of session without issues.     Edema  non-pitting     Residual limb condition   wound on  lateral incision now has dry scab ~30m diameter. No signs of infection & no drainage. Normal color & temperature.     Education Provided  Skin check;Residual limb care;Correct ply sock adjustment;Proper wear schedule/adjustment;Proper weight-bearing schedule/adjustment;Other (comment)   see prosthetic care comments   Person(s) Educated  Patient    Education Method  Explanation;Demonstration;Verbal cues    Education Method  Verbalized understanding;Needs further instruction               PT Short Term Goals - 02/22/20 2145      PT SHORT TERM GOAL #1   Title  Patient tolerates prosthesis wear >12hrs/ day with no increase in wound size.    Time  4    Period  Weeks    Status  New    Target Date  03/17/20      PT SHORT TERM GOAL #2   Title  Patient verbalizes proper sweat management and phantom pain management.    Time  4    Period  Weeks    Status  New    Target Date  03/17/20      PT SHORT TERM GOAL #3   Title  Berg Balance >52/56 indicating low fall risk.    Time  4    Period  Weeks    Status  New    Target Date  03/17/20      PT SHORT TERM GOAL #4   Title  Patient ambulates 500' with prosthesis only with supervision / verbal cues only.    Time  4    Period  Weeks    Status  New    Target Date  03/17/20      PT SHORT TERM GOAL #5   Title  Patient negotiates ramps, curbs & stairs with single rail reciprocally with supervision.    Time  4    Period  Weeks    Status  New    Target Date  03/17/20      Additional Short Term Goals   Additional Short Term Goals  Yes      PT SHORT TERM GOAL #6   Title  Patient demonstrates proper lifting & carrying 25# and stationary cycling with prosthesis.    Time  4    Period  Weeks    Status  New    Target Date  03/17/20        PT Long Term Goals - 02/22/20 2136      PT LONG TERM GOAL #1   Title  Patient tolerates wear of prosthesis >90% of awake hours with skin or limb pain issues to enable function throughout his day.   (All LTGs Target Date: 04/14/2020)    Time  8    Period  Weeks    Status  New    Target Date  04/14/20      PT LONG TERM GOAL #2   Title  Patient verbalizes & demonstrates understanding of proper prosthetic care to enable safe use of prosthesis.    Time  8    Period  Weeks    Status  New    Target Date  04/14/20      PT LONG TERM GOAL #3   Title  Functional Gait Assessment >20/30 with prosthesis only.    Time  8    Period  Weeks    Status  New    Target Date  04/14/20      PT LONG TERM GOAL #4   Title  Patient ambulates >1000' with prosthesis only including ramps, curbs & stairs independently for full community mobility.    Time  8    Period  Weeks    Status  New    Target Date  04/14/20      PT LONG TERM GOAL #5   Title  Patient demonstrates ability to lift, carry, ride bike and perform prior functional activities with prosthesis safely.    Time  8    Period  Weeks    Status  New    Target Date  04/14/20            Plan - 03/07/20 0956    Clinical Impression Statement  PT session educated patient on lifting & exercising including lifting weights with TTA prosthesis and he appears to have basic understanding. PT worked on prosthetic gait quality with cueing and he is improving.    Personal Factors and Comorbidities  Comorbidity 2;Time since onset of injury/illness/exacerbation    Comorbidities  Rt TTA, Herpes Zoster, right radius fx, pulmonary nodule,    Examination-Activity Limitations  Lift;Locomotion Level;Squat;Stairs;Stand;Transfers    Examination-Participation Restrictions  Community Activity;Yard Work;Other   exercising & high activity level   Stability/Clinical Decision Making  Stable/Uncomplicated    Rehab Potential  Good    PT Frequency  2x / week    PT Duration  8 weeks    PT Treatment/Interventions  ADLs/Self Care Home Management;Electrical Stimulation;DME Instruction;Gait training;Stair training;Functional mobility training;Therapeutic  activities;Therapeutic exercise;Balance training;Neuromuscular re-education;Patient/family education;Prosthetic Training;Manual techniques;Vestibular    PT Next Visit Plan  review prosthetic use with lifting and carrying items. Prosthetic education. Balance activities to facilitate reactions.    Consulted and Agree with Plan of Care  Patient       Patient will benefit from skilled therapeutic intervention in order to improve the following deficits and impairments:  Abnormal gait, Decreased activity tolerance, Decreased balance, Decreased knowledge of use of DME,  Decreased mobility, Decreased skin integrity, Postural dysfunction, Prosthetic Dependency, Pain  Visit Diagnosis: Other abnormalities of gait and mobility  Unsteadiness on feet  Abnormal posture  Weakness generalized  Localized edema     Problem List Patient Active Problem List   Diagnosis Date Noted  . Gangrene of right foot ([place]) 12/04/2019  . Subacute osteomyelitis, right ankle and foot ([place])   . Pilon fracture of right tibia, sequela 06/10/2019  . Displaced pilon fracture of right tibia, initial encounter for open fracture type IIIA, IIIB, or IIIC   . Subacute osteomyelitis of right tibia ([place])   . Surgery, elective   . Anxiety   . Open pilon fracture, right, type III, initial encounter 03/11/2019  . Closed fracture of right distal radius 03/11/2019  . Type III open pilon fracture of right tibia 03/11/2019  . Pounding heartbeat 01/21/2019  . Precordial chest pain 03/05/2014    Jamey Reas PT, DPT 03/07/2020, 9:59 AM  [facility] [address] [place], [place], [address] Phone: [phone]   Fax:  [phone]  Name: TRAYTON SZABO MRN: [id number] Date of Birth: 1961/10/21

## 2020-03-08 ENCOUNTER — Ambulatory Visit (INDEPENDENT_AMBULATORY_CARE_PROVIDER_SITE_OTHER): Payer: BC Managed Care – PPO | Admitting: Physician Assistant

## 2020-03-08 ENCOUNTER — Encounter: Payer: Self-pay | Admitting: Orthopedic Surgery

## 2020-03-08 VITALS — Ht 71.0 in | Wt 203.0 lb

## 2020-03-08 DIAGNOSIS — S88119A Complete traumatic amputation at level between knee and ankle, unspecified lower leg, initial encounter: Secondary | ICD-10-CM

## 2020-03-08 DIAGNOSIS — Z89511 Acquired absence of right leg below knee: Secondary | ICD-10-CM

## 2020-03-08 NOTE — Progress Notes (Signed)
Office Visit Note   Patient: Jonathan Bass           Date of Birth: Feb 26, 1961           MRN: [id number] Visit Date: 03/08/2020              Requested by: Juluis Rainier, MD [address] [place],  [place] [address] PCP: Juluis Rainier, MD  Chief Complaint  Patient presents with  . 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  He is wearing his prosthetic and feels well.  He is working with physical therapy and is ambulating independently without any type of cane or crutch.  He is working with [employer] because he is having to make some adaptions with his socket because his swelling is decreasing so quickly  Assessment & Plan: Visit Diagnoses: No diagnosis found.  Plan: He may follow-up as needed.  We have given him a prescription for new socket and supplies as needed. Follow-Up Instructions: No follow-ups on file.   Ortho Exam  Patient is alert, oriented, no adenopathy, well-dressed, normal affect, normal respiratory effort. Focused examination demonstrates well-healed amputation stump.  Full knee range of motion he ambulates without an antalgic gait no erythema on the stump no swelling  Imaging: No results found. No images are attached to the encounter.  Labs: Lab Results  Component Value Date   ESRSEDRATE 6 08/25/2019   CRP 7.5 08/25/2019   REPTSTATUS 06/17/2019 FINAL 06/12/2019   GRAMSTAIN  06/12/2019    RARE WBC PRESENT, PREDOMINANTLY PMN NO ORGANISMS SEEN    CULT  06/12/2019    RARE PSEUDOMONAS PUTIDA NO ANAEROBES ISOLATED CRITICAL RESULT CALLED TO, READ BACK BY AND VERIFIED WITH: RN Gaynelle Cage  [id number] AT 1315 BY CM Performed at [facility], [address], [place], [place] [address]    LABORGA PSEUDOMONAS PUTIDA 06/12/2019     Lab Results  Component Value Date   ALBUMIN 3.3 (L) 06/14/2019   ALBUMIN 4.0 03/11/2019   ALBUMIN 4.0  01/19/2012    No results found for: MG No results found for: VD25OH  No results found for: PREALBUMIN CBC EXTENDED Latest Ref Rng & Units 12/04/2019 06/10/2019 05/08/2019  WBC 4.0 - 10.5 K/uL 6.1 - 6.4  RBC 4.22 - 5.81 MIL/uL 5.29 - 4.61  HGB 13.0 - 17.0 g/dL 22.9 79.8 92.1  HCT 19.4 - 52.0 % 46.7 - 41.9  PLT 150 - 400 K/uL 225 - 238  NEUTROABS 1.7 - 7.7 K/uL - - -  LYMPHSABS 0.7 - 4.0 K/uL - - -     Body mass index is 28.31 kg/m.  Orders:  No orders of the defined types were placed in this encounter.  No orders of the defined types were placed in this encounter.    Procedures: No procedures performed  Clinical Data: No additional findings.  ROS:  All other systems negative, except as noted in the HPI. Review of Systems  Objective: Vital Signs: Ht 5\' 11"  (1.803 m)   Wt 203 lb (92.1 kg)   BMI 28.31 kg/m   Specialty Comments:  No specialty comments available.  PMFS History: Patient Active Problem List   Diagnosis Date Noted  . 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 Subacute osteomyelitis, right ankle and foot ([facility])   . Pilon fracture of right tibia, sequela 06/10/2019  .  Displaced pilon fracture of right tibia, initial encounter for open fracture type IIIA, IIIB, or IIIC   . Subacute osteomyelitis of right tibia ([facility])   . Surgery, elective   . Anxiety   . Open pilon fracture, right, type III, initial encounter 03/11/2019  . Closed fracture of right distal radius 03/11/2019  . Type III open pilon fracture of right tibia 03/11/2019  . Pounding heartbeat 01/21/2019  . Precordial chest pain 03/05/2014   Past Medical History:  Diagnosis Date  . Anxiety   . GERD (gastroesophageal reflux disease)    02/21/2019- not current   . Pulmonary nodule 10/2014   Noted on CT scan -> stable and follow-up 2019.  No further evaluation necessary.    Family History  Problem Relation Age of Onset  . Prostate cancer Father   . Valvular heart disease Father        Had  a heart valve replaced (unsure of details)  . Breast cancer Mother   . Parkinson's disease Mother   . Healthy Sister   . Healthy Brother   . Parkinson's disease Maternal Grandmother   . Diabetes Maternal Grandfather     Past Surgical History:  Procedure Laterality Date  . AMPUTATION Right 12/04/2019   Procedure: RIGHT BELOW KNEE AMPUTATION;  Surgeon: Nadara Mustard, MD;  Location: [facility] OR;  Service: Orthopedics;  Laterality: Right;  . APPLICATION OF WOUND VAC Right 06/10/2019   Procedure: Application Of Wound Vac -Prevena;  Surgeon: Nadara Mustard, MD;  Location: [facility] OR;  Service: Orthopedics;  Laterality: Right;  . CARDIAC EVENT MONITOR  01/2019   redominantly NSR with average rate 60 bpm.  One short 4 beat run of NSVT.  Minimal PACs and PVCs noted.  No sustained arrhythmia.  Marland Kitchen CLOSED REDUCTION WRIST FRACTURE Right 03/11/2019   Procedure: CLOSED REDUCTION WRIST;  Surgeon: [name], MD;  Location: [facility] OR;  Service: Orthopedics;  Laterality: Right;  . COLONOSCOPY    . EXTERNAL FIXATION LEG Right 03/11/2019   Procedure: EXTERNAL FIXATION LEG;  Surgeon: [name], MD;  Location: [facility] OR;  Service: Orthopedics;  Laterality: Right;  . EXTERNAL FIXATION REMOVAL Right 05/08/2019   Procedure: REMOVAL EXTERNAL FIXATION ANKLE, PLACEMENT OF CAST;  Surgeon: [name], MD;  Location: [facility] OR;  Service: Orthopedics;  Laterality: Right;  . FOOT SURGERY Bilateral    hammer toe  . HERNIA REPAIR Bilateral 2009   Inguinial  . I & D EXTREMITY Right 03/11/2019   Procedure: IRRIGATION AND DEBRIDEMENT EXTREMITY;  Surgeon: [name], MD;  Location: [facility] OR;  Service: Orthopedics;  Laterality: Right;  . IR FLUORO GUIDE CV LINE RIGHT  07/09/2019  . OPEN REDUCTION INTERNAL FIXATION (ORIF) TIBIA/FIBULA FRACTURE Right 06/10/2019   Procedure: EXCISION RIGHT TIBIA AND FIBULA, WOUND DEBRIDEMENT, AND FIXATION OF TIBIA AND FIBULA;  Surgeon: Nadara Mustard, MD;  [facility];  Service: Orthopedics;   Laterality: Right;  . OPEN REDUCTION INTERNAL FIXATION (ORIF) TIBIA/FIBULA FRACTURE Right 06/12/2019   Procedure: DEBRIDEMENT AND REVISION INTERNAL FIXATION INFECTED RIGHT PILON FRACTURE;  Surgeon: Nadara Mustard, MD;  Location: [facility] OR;  Service: Orthopedics;  Laterality: Right;  . ORIF ANKLE FRACTURE Right 03/11/2019   Procedure: OPEN REDUCTION INTERNAL FIXATION (ORIF) ANKLE FRACTURE;  Surgeon: [name], MD;  [facility];  Service: Orthopedics;  Laterality: Right;  . ORIF WRIST FRACTURE Right 03/11/2019   Procedure: OPEN REDUCTION INTERNAL FIXATION (ORIF) WRIST FRACTURE;  Surgeon: [name], MD;  Location: [facility]  OR;  Service: Orthopedics;  Laterality: Right;  . TONSILLECTOMY    . TRANSTHORACIC ECHOCARDIOGRAM  01/2019   EF 55 to 60%.  Normal wall motion.  Indeterminate likely grade 1 diastolic function.  Mild degenerative mitral valve disease with thickening but no prolapse.   Social History   Occupational History  . Occupation: Freight forwarder at [employer]: [place]  Tobacco Use  . Smoking status: Never Smoker  . Smokeless tobacco: Never Used  Substance and Sexual Activity  . Alcohol use: No  . Drug use: No  . Sexual activity: Not on file

## 2020-03-10 ENCOUNTER — Encounter: Payer: BC Managed Care – PPO | Admitting: Physical Therapy

## 2020-03-10 ENCOUNTER — Ambulatory Visit: Payer: BC Managed Care – PPO | Admitting: Orthopedic Surgery

## 2020-03-14 ENCOUNTER — Ambulatory Visit (INDEPENDENT_AMBULATORY_CARE_PROVIDER_SITE_OTHER): Payer: BC Managed Care – PPO | Admitting: Physical Therapy

## 2020-03-14 ENCOUNTER — Other Ambulatory Visit: Payer: Self-pay

## 2020-03-14 ENCOUNTER — Encounter: Payer: Self-pay | Admitting: Physical Therapy

## 2020-03-14 DIAGNOSIS — R531 Weakness: Secondary | ICD-10-CM

## 2020-03-14 DIAGNOSIS — R293 Abnormal posture: Secondary | ICD-10-CM

## 2020-03-14 DIAGNOSIS — R2681 Unsteadiness on feet: Secondary | ICD-10-CM

## 2020-03-14 DIAGNOSIS — G546 Phantom limb syndrome with pain: Secondary | ICD-10-CM

## 2020-03-14 DIAGNOSIS — R2689 Other abnormalities of gait and mobility: Secondary | ICD-10-CM

## 2020-03-14 DIAGNOSIS — R600 Localized edema: Secondary | ICD-10-CM

## 2020-03-14 NOTE — Therapy (Signed)
[facility] Physical Therapy [address] [place], [place], [address] Phone: [phone]   Fax:  [phone]  Physical Therapy Treatment  Patient Details  Name: Jonathan Bass MRN: [id number] Date of Birth: Feb 10, 1961 Referring Provider (PT): Meridee Score, MD   Encounter Date: 03/14/2020  PT End of Session - 03/14/20 1014    Visit Number  6    Number of Visits  16    Authorization Type  BCBS    Authorization Time Period  Ded MET OOP Met $0 co-pay    PT Start Time  0845    PT Stop Time  0931    PT Time Calculation (min)  46 min    Equipment Utilized During Treatment  Gait belt    Activity Tolerance  Patient tolerated treatment well    Behavior During Therapy  [facility] for tasks assessed/performed       Past Medical History:  Diagnosis Date  . Anxiety   . GERD (gastroesophageal reflux disease)    02/21/2019- not current   . Pulmonary nodule 10/2014   Noted on CT scan -> stable and follow-up 2019.  No further evaluation necessary.    Past Surgical History:  Procedure Laterality Date  . AMPUTATION Right 12/04/2019   Procedure: RIGHT BELOW KNEE AMPUTATION;  Surgeon: Newt Minion, MD;  Location: [place];  Service: Orthopedics;  Laterality: Right;  . APPLICATION OF WOUND VAC Right 06/10/2019   Procedure: Application Of Wound Vac -Prevena;  Surgeon: Newt Minion, MD;  Location: [place];  Service: Orthopedics;  Laterality: Right;  . CARDIAC EVENT MONITOR  01/2019   redominantly NSR with average rate 60 bpm.  One short 4 beat run of NSVT.  Minimal PACs and PVCs noted.  No sustained arrhythmia.  Marland Kitchen CLOSED REDUCTION WRIST FRACTURE Right 03/11/2019   Procedure: CLOSED REDUCTION WRIST;  Surgeon: Shona Needles, MD;  Location: [place];  Service: Orthopedics;  Laterality: Right;  . COLONOSCOPY    . EXTERNAL FIXATION LEG Right 03/11/2019   Procedure: EXTERNAL FIXATION LEG;  Surgeon: Shona Needles, MD;  Location: [place];  Service: Orthopedics;  Laterality: Right;  . EXTERNAL  FIXATION REMOVAL Right 05/08/2019   Procedure: REMOVAL EXTERNAL FIXATION ANKLE, PLACEMENT OF CAST;  Surgeon: Shona Needles, MD;  Location: [place];  Service: Orthopedics;  Laterality: Right;  . FOOT SURGERY Bilateral    hammer toe  . HERNIA REPAIR Bilateral 2009   Inguinial  . I & D EXTREMITY Right 03/11/2019   Procedure: IRRIGATION AND DEBRIDEMENT EXTREMITY;  Surgeon: Shona Needles, MD;  Location: [place];  Service: Orthopedics;  Laterality: Right;  . IR FLUORO GUIDE CV LINE RIGHT  07/09/2019  . OPEN REDUCTION INTERNAL FIXATION (ORIF) TIBIA/FIBULA FRACTURE Right 06/10/2019   Procedure: EXCISION RIGHT TIBIA AND FIBULA, WOUND DEBRIDEMENT, AND FIXATION OF TIBIA AND FIBULA;  Surgeon: Newt Minion, MD;  Location: [place];  Service: Orthopedics;  Laterality: Right;  . OPEN REDUCTION INTERNAL FIXATION (ORIF) TIBIA/FIBULA FRACTURE Right 06/12/2019   Procedure: DEBRIDEMENT AND REVISION INTERNAL FIXATION INFECTED RIGHT PILON FRACTURE;  Surgeon: Newt Minion, MD;  Location: [place];  Service: Orthopedics;  Laterality: Right;  . ORIF ANKLE FRACTURE Right 03/11/2019   Procedure: OPEN REDUCTION INTERNAL FIXATION (ORIF) ANKLE FRACTURE;  Surgeon: Shona Needles, MD;  Location: [place];  Service: Orthopedics;  Laterality: Right;  . ORIF WRIST FRACTURE Right 03/11/2019   Procedure: OPEN REDUCTION INTERNAL FIXATION (ORIF) WRIST FRACTURE;  Surgeon: Shona Needles, MD;  Location: [facility];  Service: Orthopedics;  Laterality: Right;  . TONSILLECTOMY    . TRANSTHORACIC ECHOCARDIOGRAM  01/2019   EF 55 to 60%.  Normal wall motion.  Indeterminate likely grade 1 diastolic function.  Mild degenerative mitral valve disease with thickening but no prolapse.    There were no vitals filed for this visit.  Subjective Assessment - 03/14/20 0844    Subjective  He had 2 grandchildren (9 months & 2yo) and was active with them. No issues. No falls. He did yard work International aid/development worker, blowing with Secretary/administrator, weedeating/edging, riding  mower)    Limitations  Lifting;Standing;Walking;House hold activities    Patient Stated Goals  He would like to return to pre injury exercising (weights & cardio / running, elliptical, biking), hiking, road cycling, boating / water ski, softball,    Currently in Pain?  No/denies    Pain Onset  More than a month ago                       [facility] Adult PT Treatment/Exercise - 03/14/20 0845      Transfers   Transfers  Floor to Transfer    Floor to Transfer  5: Supervision;With upper extremity assist    Floor to Transfer Details (indicate cue type and reason)  PT demo & verbal cues on technique with pushing on mat table/ horizontal support and pushing off floor. Pt return demo understanding safely without assist.     Floor to Transfer Details  Visual cues/gestures for sequencing;Verbal cues for technique      Ambulation/Gait   Ambulation/Gait  Yes    Ambulation/Gait Assistance  5: Supervision    Ambulation/Gait Assistance Details  verbal cues on equal stance duration    Ambulation Distance (Feet)  1000 Feet   >1000'    Assistive device  Prosthesis    Gait Pattern  Step-through pattern;Decreased arm swing - right;Decreased stance time - right;Decreased step length - left;Decreased hip/knee flexion - right;Decreased weight shift to right;Right hip hike;Right flexed knee in stance;Antalgic;Abducted- right    Ambulation Surface  Indoor;Level    Stairs  Yes    Stairs Assistance  5: Supervision    Stairs Assistance Details (indicate cue type and reason)  verbal cues on alternating pattern with toes over edge descending to compensate for stiff prosthetic     Stair Management Technique  One rail Right;Alternating pattern;Forwards    Number of Stairs  11   11 X 2   Height of Stairs  6      High Level Balance   High Level Balance Activities  --    High Level Balance Comments  --      Therapeutic Activites    Therapeutic Activities  Lifting    Lifting  --    Work Economist  PT  used internet to show patient garden Museum/gallery exhibitions officer. Pt verbalized understanding.     Other Therapeutic Activities  PT demo & verbal cues on climbing ladders with TTA prosthesis. Pt return demo with mounted vertical ladder & 2 runners of A-frame ladder.       Knee/Hip Exercises: Aerobic   Stationary Bike  --    Tread Mill  --      Knee/Hip Exercises: Standing   Functional Squat  --    Functional Squat Limitations  --      Shoulder Exercises: Seated   Other Seated Exercises  --      Prosthetics   Prosthetic Care Comments   Fall  risk when prosthesis is off during day.  Wear prosthesis all awake hours and dry prn.  Consider switching liners after dinner to wash one he has been using. Donne liner & prosthesis to walk to/from bathroom at night.  How to determine if need shrinker over night.  Use of prosthesis in water / pool.     Current prosthetic wear tolerance (days/week)   daily    Current prosthetic wear tolerance (#hours/day)   most of awake hours, removing to dry     Current prosthetic weight-bearing tolerance (hours/day)   tolerated standing & gait >30 minutes of session without issues.     Edema  non-pitting     Residual limb condition   no issues or open areas.     Education Provided  Skin check;Residual limb care;Proper wear schedule/adjustment;Other (comment)   see prosthetic care comments   Person(s) Educated  Patient    Education Method  Explanation;Verbal cues    Education Method  Verbalized understanding    Donning Prosthesis  Modified independent (device/increased time)               PT Short Term Goals - 03/14/20 1015      PT SHORT TERM GOAL #1   Title  Patient tolerates prosthesis wear >12hrs/ day with no increase in wound size.    Baseline  MET 03/14/2020    Time  4    Period  Weeks    Status  Achieved    Target Date  03/17/20      PT SHORT TERM GOAL #2   Title  Patient verbalizes proper sweat management and phantom pain management.    Baseline  MET  03/14/2020    Time  4    Period  Weeks    Status  Achieved    Target Date  03/17/20      PT SHORT TERM GOAL #3   Title  Berg Balance >52/56 indicating low fall risk.    Time  4    Period  Weeks    Status  On-going    Target Date  03/17/20      PT SHORT TERM GOAL #4   Title  Patient ambulates 500' with prosthesis only with supervision / verbal cues only.    Baseline  MET 03/14/2020    Time  4    Period  Weeks    Status  Achieved    Target Date  03/17/20      PT SHORT TERM GOAL #5   Title  Patient negotiates ramps, curbs & stairs with single rail reciprocally with supervision.    Baseline  MET 03/14/2020    Time  4    Period  Weeks    Status  Achieved    Target Date  03/17/20      PT SHORT TERM GOAL #6   Title  Patient demonstrates proper lifting & carrying 25# and stationary cycling with prosthesis.    Baseline  MET    Time  4    Period  Weeks    Status  Achieved    Target Date  03/17/20        PT Long Term Goals - 02/22/20 2136      PT LONG TERM GOAL #1   Title  Patient tolerates wear of prosthesis >90% of awake hours with skin or limb pain issues to enable function throughout his day.  (All LTGs Target Date: 04/14/2020)    Time  8    Period  Weeks  Status  New    Target Date  04/14/20      PT LONG TERM GOAL #2   Title  Patient verbalizes & demonstrates understanding of proper prosthetic care to enable safe use of prosthesis.    Time  8    Period  Weeks    Status  New    Target Date  04/14/20      PT LONG TERM GOAL #3   Title  Functional Gait Assessment >20/30 with prosthesis only.    Time  8    Period  Weeks    Status  New    Target Date  04/14/20      PT LONG TERM GOAL #4   Title  Patient ambulates >1000' with prosthesis only including ramps, curbs & stairs independently for full community mobility.    Time  8    Period  Weeks    Status  New    Target Date  04/14/20      PT LONG TERM GOAL #5   Title  Patient demonstrates ability to lift, carry,  ride bike and perform prior functional activities with prosthesis safely.    Time  8    Period  Weeks    Status  New    Target Date  04/14/20            Plan - 03/14/20 1016    Clinical Impression Statement  PT progressed prosthetic instructions and pt appears to understand.  PT instructed in floor transfers and climbing ladders with TTA prosthesis and he appears to understand.    Personal Factors and Comorbidities  Comorbidity 2;Time since onset of injury/illness/exacerbation    Comorbidities  Rt TTA, Herpes Zoster, right radius fx, pulmonary nodule,    Examination-Activity Limitations  Lift;Locomotion Level;Squat;Stairs;Stand;Transfers    Examination-Participation Restrictions  Community Activity;Yard Work;Other   exercising & high activity level   Stability/Clinical Decision Making  Stable/Uncomplicated    Rehab Potential  Good    PT Frequency  2x / week    PT Duration  8 weeks    PT Treatment/Interventions  ADLs/Self Care Home Management;Electrical Stimulation;DME Instruction;Gait training;Stair training;Functional mobility training;Therapeutic activities;Therapeutic exercise;Balance training;Neuromuscular re-education;Patient/family education;Prosthetic Training;Manual techniques;Vestibular    PT Next Visit Plan  Berg Balance for STG, progress Prosthetic education. Balance activities to facilitate reactions.    Consulted and Agree with Plan of Care  Patient       Patient will benefit from skilled therapeutic intervention in order to improve the following deficits and impairments:  Abnormal gait, Decreased activity tolerance, Decreased balance, Decreased knowledge of use of DME, Decreased mobility, Decreased skin integrity, Postural dysfunction, Prosthetic Dependency, Pain  Visit Diagnosis: Other abnormalities of gait and mobility  Unsteadiness on feet  Abnormal posture  Weakness generalized  Localized edema  Phantom limb syndrome with pain [facility])     Problem  List Patient Active Problem List   Diagnosis Date Noted  . Gangrene of right foot ([place]) 12/04/2019  . Subacute osteomyelitis, right ankle and foot ([place])   . Pilon fracture of right tibia, sequela 06/10/2019  . Displaced pilon fracture of right tibia, initial encounter for open fracture type IIIA, IIIB, or IIIC   . Subacute osteomyelitis of right tibia ([place])   . Surgery, elective   . Anxiety   . Open pilon fracture, right, type III, initial encounter 03/11/2019  . Closed fracture of right distal radius 03/11/2019  . Type III open pilon fracture of right tibia 03/11/2019  . Pounding heartbeat 01/21/2019  .  Precordial chest pain 03/05/2014    Jamey Reas PT, DPT 03/14/2020, 10:18 AM  [facility] Physical Therapy [address] [place], [place], [address] Phone: [phone]   Fax:  [phone]  Name: LEVI KLAIBER MRN: [id number] Date of Birth: 12/18/1960

## 2020-03-21 ENCOUNTER — Other Ambulatory Visit: Payer: Self-pay

## 2020-03-21 ENCOUNTER — Ambulatory Visit (INDEPENDENT_AMBULATORY_CARE_PROVIDER_SITE_OTHER): Payer: BC Managed Care – PPO | Admitting: Physical Therapy

## 2020-03-21 ENCOUNTER — Encounter: Payer: Self-pay | Admitting: Physical Therapy

## 2020-03-21 DIAGNOSIS — R2689 Other abnormalities of gait and mobility: Secondary | ICD-10-CM

## 2020-03-21 DIAGNOSIS — R531 Weakness: Secondary | ICD-10-CM | POA: Diagnosis not present

## 2020-03-21 DIAGNOSIS — R600 Localized edema: Secondary | ICD-10-CM

## 2020-03-21 DIAGNOSIS — R293 Abnormal posture: Secondary | ICD-10-CM | POA: Diagnosis not present

## 2020-03-21 DIAGNOSIS — R2681 Unsteadiness on feet: Secondary | ICD-10-CM | POA: Diagnosis not present

## 2020-03-21 NOTE — Therapy (Signed)
[facility] Physical Therapy [address] [place], [place], [address] Phone: [phone]   Fax:  [phone]  Physical Therapy Treatment  Patient Details  Name: Jonathan Bass MRN: [id number] Date of Birth: 1961/01/27 Referring Provider (PT): Meridee Score, MD   Encounter Date: 03/21/2020  PT End of Session - 03/21/20 1621    Visit Number  7    Number of Visits  16    Authorization Type  BCBS    Authorization Time Period  Ded MET OOP Met $0 co-pay    PT Start Time  1400    PT Stop Time  1448    PT Time Calculation (min)  48 min    Equipment Utilized During Treatment  Gait belt    Activity Tolerance  Patient tolerated treatment well    Behavior During Therapy  WFL for tasks assessed/performed       Past Medical History:  Diagnosis Date  . Anxiety   . GERD (gastroesophageal reflux disease)    02/21/2019- not current   . Pulmonary nodule 10/2014   Noted on CT scan -> stable and follow-up 2019.  No further evaluation necessary.    Past Surgical History:  Procedure Laterality Date  . AMPUTATION Right 12/04/2019   Procedure: RIGHT BELOW KNEE AMPUTATION;  Surgeon: Newt Minion, MD;  Location: [place];  Service: Orthopedics;  Laterality: Right;  . APPLICATION OF WOUND VAC Right 06/10/2019   Procedure: Application Of Wound Vac -Prevena;  Surgeon: Newt Minion, MD;  Location: [place];  Service: Orthopedics;  Laterality: Right;  . CARDIAC EVENT MONITOR  01/2019   redominantly NSR with average rate 60 bpm.  One short 4 beat run of NSVT.  Minimal PACs and PVCs noted.  No sustained arrhythmia.  Marland Kitchen CLOSED REDUCTION WRIST FRACTURE Right 03/11/2019   Procedure: CLOSED REDUCTION WRIST;  Surgeon: Shona Needles, MD;  Location: [place];  Service: Orthopedics;  Laterality: Right;  . COLONOSCOPY    . EXTERNAL FIXATION LEG Right 03/11/2019   Procedure: EXTERNAL FIXATION LEG;  Surgeon: Shona Needles, MD;  Location: [place];  Service: Orthopedics;  Laterality: Right;  . EXTERNAL  FIXATION REMOVAL Right 05/08/2019   Procedure: REMOVAL EXTERNAL FIXATION ANKLE, PLACEMENT OF CAST;  Surgeon: Shona Needles, MD;  Location: [place];  Service: Orthopedics;  Laterality: Right;  . FOOT SURGERY Bilateral    hammer toe  . HERNIA REPAIR Bilateral 2009   Inguinial  . I & D EXTREMITY Right 03/11/2019   Procedure: IRRIGATION AND DEBRIDEMENT EXTREMITY;  Surgeon: Shona Needles, MD;  Location: [place];  Service: Orthopedics;  Laterality: Right;  . IR FLUORO GUIDE CV LINE RIGHT  07/09/2019  . OPEN REDUCTION INTERNAL FIXATION (ORIF) TIBIA/FIBULA FRACTURE Right 06/10/2019   Procedure: EXCISION RIGHT TIBIA AND FIBULA, WOUND DEBRIDEMENT, AND FIXATION OF TIBIA AND FIBULA;  Surgeon: Newt Minion, MD;  Location: [place];  Service: Orthopedics;  Laterality: Right;  . OPEN REDUCTION INTERNAL FIXATION (ORIF) TIBIA/FIBULA FRACTURE Right 06/12/2019   Procedure: DEBRIDEMENT AND REVISION INTERNAL FIXATION INFECTED RIGHT PILON FRACTURE;  Surgeon: Newt Minion, MD;  Location: [place];  Service: Orthopedics;  Laterality: Right;  . ORIF ANKLE FRACTURE Right 03/11/2019   Procedure: OPEN REDUCTION INTERNAL FIXATION (ORIF) ANKLE FRACTURE;  Surgeon: Shona Needles, MD;  Location: [place];  Service: Orthopedics;  Laterality: Right;  . ORIF WRIST FRACTURE Right 03/11/2019   Procedure: OPEN REDUCTION INTERNAL FIXATION (ORIF) WRIST FRACTURE;  Surgeon: Shona Needles, MD;  Location: [facility]  OR;  Service: Orthopedics;  Laterality: Right;  . TONSILLECTOMY    . TRANSTHORACIC ECHOCARDIOGRAM  01/2019   EF 55 to 60%.  Normal wall motion.  Indeterminate likely grade 1 diastolic function.  Mild degenerative mitral valve disease with thickening but no prolapse.    There were no vitals filed for this visit.  Subjective Assessment - 03/21/20 1400    Subjective  He has been doing yard work without issues. His lower left back is sore.    Limitations  Lifting;Standing;Walking;House hold activities    Patient Stated Goals  He would like  to return to pre injury exercising (weights & cardio / running, elliptical, biking), hiking, road cycling, boating / water ski, softball,    Currently in Pain?  Yes    Pain Score  3    in last week worst 5/10, best 1-2/10   Pain Location  Back    Pain Orientation  Left;Lower    Pain Descriptors / Indicators  Sore    Pain Onset  More than a month ago    Pain Frequency  Intermittent    Aggravating Factors   yard work, more strenous activity or on feet long periods.    Pain Relieving Factors  laying on back, NSAIDs         Prosthetic Training PT demo & verbal cues on wt shift with prosthesis forward for push & pull activities like shoveling, raking, etc.                       PT Education - 03/21/20 1445    Education Details  exercises for back, standing with core engagement UE theraband and sitting on 65cm ball - see pt instructions.    Person(s) Educated  Patient    Methods  Explanation;Demonstration;Tactile cues;Verbal cues;Handout    Comprehension  Verbalized understanding;Returned demonstration       PT Short Term Goals - 03/14/20 1015      PT SHORT TERM GOAL #1   Title  Patient tolerates prosthesis wear >12hrs/ day with no increase in wound size.    Baseline  MET 03/14/2020    Time  4    Period  Weeks    Status  Achieved    Target Date  03/17/20      PT SHORT TERM GOAL #2   Title  Patient verbalizes proper sweat management and phantom pain management.    Baseline  MET 03/14/2020    Time  4    Period  Weeks    Status  Achieved    Target Date  03/17/20      PT SHORT TERM GOAL #3   Title  Berg Balance >52/56 indicating low fall risk.    Time  4    Period  Weeks    Status  On-going    Target Date  03/17/20      PT SHORT TERM GOAL #4   Title  Patient ambulates 500' with prosthesis only with supervision / verbal cues only.    Baseline  MET 03/14/2020    Time  4    Period  Weeks    Status  Achieved    Target Date  03/17/20      PT SHORT TERM  GOAL #5   Title  Patient negotiates ramps, curbs & stairs with single rail reciprocally with supervision.    Baseline  MET 03/14/2020    Time  4    Period  Weeks    Status  Achieved    Target Date  03/17/20      PT SHORT TERM GOAL #6   Title  Patient demonstrates proper lifting & carrying 25# and stationary cycling with prosthesis.    Baseline  MET    Time  4    Period  Weeks    Status  Achieved    Target Date  03/17/20        PT Long Term Goals - 02/22/20 2136      PT LONG TERM GOAL #1   Title  Patient tolerates wear of prosthesis >90% of awake hours with skin or limb pain issues to enable function throughout his day.  (All LTGs Target Date: 04/14/2020)    Time  8    Period  Weeks    Status  New    Target Date  04/14/20      PT LONG TERM GOAL #2   Title  Patient verbalizes & demonstrates understanding of proper prosthetic care to enable safe use of prosthesis.    Time  8    Period  Weeks    Status  New    Target Date  04/14/20      PT LONG TERM GOAL #3   Title  Functional Gait Assessment >20/30 with prosthesis only.    Time  8    Period  Weeks    Status  New    Target Date  04/14/20      PT LONG TERM GOAL #4   Title  Patient ambulates >1000' with prosthesis only including ramps, curbs & stairs independently for full community mobility.    Time  8    Period  Weeks    Status  New    Target Date  04/14/20      PT LONG TERM GOAL #5   Title  Patient demonstrates ability to lift, carry, ride bike and perform prior functional activities with prosthesis safely.    Time  8    Period  Weeks    Status  New    Target Date  04/14/20            Plan - 03/21/20 2300    Clinical Impression Statement  Patient has back pain recurrance. PT instructed in HEP and reports improved LBP after exercises. He is on target to meet LTGs. He is getting new prosthetic socket in next few weeks and would benefit from PT session to assess how functioning with new socket.    Personal  Factors and Comorbidities  Comorbidity 2;Time since onset of injury/illness/exacerbation    Comorbidities  Rt TTA, Herpes Zoster, right radius fx, pulmonary nodule,    Examination-Activity Limitations  Lift;Locomotion Level;Squat;Stairs;Stand;Transfers    Examination-Participation Restrictions  Community Activity;Yard Work;Other   exercising & high activity level   Stability/Clinical Decision Making  Stable/Uncomplicated    Rehab Potential  Good    PT Frequency  2x / week    PT Duration  8 weeks    PT Treatment/Interventions  ADLs/Self Care Home Management;Electrical Stimulation;DME Instruction;Gait training;Stair training;Functional mobility training;Therapeutic activities;Therapeutic exercise;Balance training;Neuromuscular re-education;Patient/family education;Prosthetic Training;Manual techniques;Vestibular    PT Next Visit Plan  check how LBP is going, check LTGs    Consulted and Agree with Plan of Care  Patient       Patient will benefit from skilled therapeutic intervention in order to improve the following deficits and impairments:  Abnormal gait, Decreased activity tolerance, Decreased balance, Decreased knowledge of use of DME, Decreased mobility, Decreased skin integrity, Postural dysfunction, Prosthetic  Dependency, Pain  Visit Diagnosis: Other abnormalities of gait and mobility  Unsteadiness on feet  Abnormal posture  Weakness generalized  Localized edema     Problem List Patient Active Problem List   Diagnosis Date Noted  . Gangrene of right foot ([place]) 12/04/2019  . Subacute osteomyelitis, right ankle and foot ([place])   . Pilon fracture of right tibia, sequela 06/10/2019  . Displaced pilon fracture of right tibia, initial encounter for open fracture type IIIA, IIIB, or IIIC   . Subacute osteomyelitis of right tibia ([place])   . Surgery, elective   . Anxiety   . Open pilon fracture, right, type III, initial encounter 03/11/2019  . Closed fracture of right distal radius  03/11/2019  . Type III open pilon fracture of right tibia 03/11/2019  . Pounding heartbeat 01/21/2019  . Precordial chest pain 03/05/2014    Jamey Reas PT, DPT 03/21/2020, 11:08 PM  [facility] Physical Therapy [address] [place], [place], [address] Phone: [phone]   Fax:  [phone]  Name: Jonathan Bass MRN: [id number] Date of Birth: 01/15/1961

## 2020-03-21 NOTE — Patient Instructions (Addendum)
Access Code: [email or web address] URL: [email or web address] Date: 03/21/2020 Prepared by: [facility]  Exercises Hooklying Single Knee to Chest Stretch - 1-2 x daily - 7 x weekly - 1 sets - 2-3 reps - 15-30 seconds hold Supine Double Knee to Chest - 1-2 x daily - 7 x weekly - 1 sets - 2-3 reps - 15-30 seconds hold Supine Piriformis Stretch with Leg Straight - 1-2 x daily - 7 x weekly - 1 sets - 2-3 reps - 15-30 seconds hold Prone Press Up - 1-2 x daily - 7 x weekly - 1 sets - 2-3 reps - 15-30 seconds hold Cat-Camel to Child's Pose - 1-2 x daily - 7 x weekly - 1 sets - 10 reps - 5 seconds hold Quadruped Alternating Leg Extensions - 1-2 x daily - 7 x weekly - 1 sets - 10 reps - 5 seconds hold  Standing with upright posture, equal weight bearing and engage core 1. Rows 10 reps 2. Forward push BUEs 3. Biceps curl / overhead push  Sitting on 65cm ball 1.anterior/posterior rolls 2.right/left rolls 3. UE movement shoulder flexion, lateral raises, biceps curl to overhead reach 4. Knee extension 5. Side stepping 6. Contralateral hip flexion / shoulder flexion

## 2020-03-22 ENCOUNTER — Encounter: Payer: BC Managed Care – PPO | Admitting: Physical Therapy

## 2020-03-30 ENCOUNTER — Encounter: Payer: BC Managed Care – PPO | Admitting: Rehabilitative and Restorative Service Providers"

## 2020-04-03 ENCOUNTER — Encounter: Payer: Self-pay | Admitting: Orthopedic Surgery

## 2020-04-04 ENCOUNTER — Encounter: Payer: BC Managed Care – PPO | Admitting: Physical Therapy

## 2020-04-05 ENCOUNTER — Encounter: Payer: BC Managed Care – PPO | Admitting: Physical Therapy

## 2020-04-18 ENCOUNTER — Encounter: Payer: BC Managed Care – PPO | Admitting: Physical Therapy

## 2020-04-25 ENCOUNTER — Encounter: Payer: Self-pay | Admitting: Physical Therapy

## 2020-04-25 ENCOUNTER — Other Ambulatory Visit: Payer: Self-pay

## 2020-04-25 ENCOUNTER — Ambulatory Visit (INDEPENDENT_AMBULATORY_CARE_PROVIDER_SITE_OTHER): Payer: BC Managed Care – PPO | Admitting: Physical Therapy

## 2020-04-25 DIAGNOSIS — R2681 Unsteadiness on feet: Secondary | ICD-10-CM | POA: Diagnosis not present

## 2020-04-25 DIAGNOSIS — R2689 Other abnormalities of gait and mobility: Secondary | ICD-10-CM | POA: Diagnosis not present

## 2020-04-25 DIAGNOSIS — R531 Weakness: Secondary | ICD-10-CM

## 2020-04-25 DIAGNOSIS — R600 Localized edema: Secondary | ICD-10-CM

## 2020-04-25 DIAGNOSIS — R293 Abnormal posture: Secondary | ICD-10-CM | POA: Diagnosis not present

## 2020-04-25 NOTE — Therapy (Signed)
[facility] [address] [place], [place], [address] Phone: [phone]   Fax:  [phone]  Physical Therapy Treatment & Discharge Summary  Patient Details  Name: Jonathan Bass MRN: [id number] Date of Birth: 1961-04-13 Referring Provider (PT): Meridee Score, MD   Encounter Date: 04/25/2020   PHYSICAL THERAPY DISCHARGE SUMMARY  Visits from Start of Care: 8  Current functional level related to goals / functional outcomes: All long term goals met   Remaining deficits: Functional Gait Assessment 28 / 30 and Berg Balance 56 / 56 indicate low fall risk.    Education / Equipment: Prosthetic training & fitness / exercising with prosthesis.  Plan: Patient agrees to discharge.  Patient goals were met. Patient is being discharged due to meeting the stated rehab goals.  ?????       PT End of Session - 04/25/20 1351    Visit Number  8    Number of Visits  16    Authorization Type  BCBS    Authorization Time Period  Ded MET OOP Met $0 co-pay    PT Start Time  3875    PT Stop Time  1430    PT Time Calculation (min)  45 min    Equipment Utilized During Treatment  Gait belt    Activity Tolerance  Patient tolerated treatment well    Behavior During Therapy  WFL for tasks assessed/performed       Past Medical History:  Diagnosis Date  . Anxiety   . GERD (gastroesophageal reflux disease)    02/21/2019- not current   . Pulmonary nodule 10/2014   Noted on CT scan -> stable and follow-up 2019.  No further evaluation necessary.    Past Surgical History:  Procedure Laterality Date  . AMPUTATION Right 12/04/2019   Procedure: RIGHT BELOW KNEE AMPUTATION;  Surgeon: Newt Minion, MD;  Location: [place];  Service: Orthopedics;  Laterality: Right;  . APPLICATION OF WOUND VAC Right 06/10/2019   Procedure: Application Of Wound Vac -Prevena;  Surgeon: Newt Minion, MD;  Location: [place];  Service: Orthopedics;  Laterality: Right;  . CARDIAC EVENT  MONITOR  01/2019   redominantly NSR with average rate 60 bpm.  One short 4 beat run of NSVT.  Minimal PACs and PVCs noted.  No sustained arrhythmia.  Marland Kitchen CLOSED REDUCTION WRIST FRACTURE Right 03/11/2019   Procedure: CLOSED REDUCTION WRIST;  Surgeon: Shona Needles, MD;  Location[place]  Service: Orthopedics;  Laterality: Right;  . COLONOSCOPY    . EXTERNAL FIXATION LEG Right 03/11/2019   Procedure: EXTERNAL FIXATION LEG;  Surgeon: Shona Needles, MD;  Location: [place];  Service: Orthopedics;  Laterality: Right;  . EXTERNAL FIXATION REMOVAL Right 05/08/2019   Procedure: REMOVAL EXTERNAL FIXATION ANKLE, PLACEMENT OF CAST;  Surgeon: Shona Needles, MD;  Location: [place];  Service: Orthopedics;  Laterality: Right;  . FOOT SURGERY Bilateral    hammer toe  . HERNIA REPAIR Bilateral 2009   Inguinial  . I & D EXTREMITY Right 03/11/2019   Procedure: IRRIGATION AND DEBRIDEMENT EXTREMITY;  Surgeon: Shona Needles, MD;  Location: [place];  Service: Orthopedics;  Laterality: Right;  . IR FLUORO GUIDE CV LINE RIGHT  07/09/2019  . OPEN REDUCTION INTERNAL FIXATION (ORIF) TIBIA/FIBULA FRACTURE Right 06/10/2019   Procedure: EXCISION RIGHT TIBIA AND FIBULA, WOUND DEBRIDEMENT, AND FIXATION OF TIBIA AND FIBULA;  Surgeon: Newt Minion, MD;  Location: [place];  Service: Orthopedics;  Laterality: Right;  . OPEN REDUCTION  INTERNAL FIXATION (ORIF) TIBIA/FIBULA FRACTURE Right 06/12/2019   Procedure: DEBRIDEMENT AND REVISION INTERNAL FIXATION INFECTED RIGHT PILON FRACTURE;  Surgeon: Newt Minion, MD;  Location: [place];  Service: Orthopedics;  Laterality: Right;  . ORIF ANKLE FRACTURE Right 03/11/2019   Procedure: OPEN REDUCTION INTERNAL FIXATION (ORIF) ANKLE FRACTURE;  Surgeon: Shona Needles, MD;  Location: [place];  Service: Orthopedics;  Laterality: Right;  . ORIF WRIST FRACTURE Right 03/11/2019   Procedure: OPEN REDUCTION INTERNAL FIXATION (ORIF) WRIST FRACTURE;  Surgeon: Shona Needles, MD;  Location: [place];  Service:  Orthopedics;  Laterality: Right;  . TONSILLECTOMY    . TRANSTHORACIC ECHOCARDIOGRAM  01/2019   EF 55 to 60%.  Normal wall motion.  Indeterminate likely grade 1 diastolic function.  Mild degenerative mitral valve disease with thickening but no prolapse.    There were no vitals filed for this visit.  Subjective Assessment - 04/25/20 1345    Subjective  He got new socket 12 days ago. He is already wearing 10-ply mornings with ~18-ply by midmorning to midday.  He is looking into advanced prosthetic systems as option.  He is not having any issues with tasks like weight lifting, yard work & even climbed a ladder like PT taught him    Limitations  Lifting;Standing;Walking;House hold activities    Patient Stated Goals  He would like to return to pre injury exercising (weights & cardio / running, elliptical, biking), hiking, road cycling, boating / water ski, softball,    Currently in Pain?  No/denies    Pain Onset  More than a month ago          [facility] PT Assessment - 04/25/20 1345      Assessment   Medical Diagnosis  Right Transtibial Amputation    Referring Provider (PT)  Meridee Score, MD      PROM   Overall PROM   Within functional limits for tasks performed      Strength   Overall Strength  Within functional limits for tasks performed      Transfers   Transfers  Sit to Stand;Stand to Sit    Sit to Stand  7: Independent;Without upper extremity assist;From chair/3-in-1    Stand to Sit  7: Independent;Without upper extremity assist;To chair/3-in-1    Floor to Transfer  6: Modified independent (Device/Increase time);With upper extremity assist   pushing on floor     Ambulation/Gait   Ambulation/Gait  Yes    Ambulation/Gait Assistance  7: Independent    Ambulation Distance (Feet)  1500 Feet    Assistive device  Prosthesis;None    Gait Pattern  Within Functional Limits    Ambulation Surface  Level;Indoor;Outdoor;Unlevel;Paved    Gait velocity  3.33 ft/sec comfortable pace & 5.17 ft/sec  fast pace     Stairs  Yes    Stairs Assistance  6: Modified independent (Device/Increase time)    Stair Management Technique  No rails;Alternating pattern;Forwards   TTA prosthesis   Number of Stairs  11   11 + 11 + 6   Height of Stairs  6    Ramp  7: Independent   prosthesis only   Curb  7: Independent   prosthesis only   Gait Comments  light jog 200' modified independent with prosthesis only      Berg Balance Test   Sit to Stand  Able to stand without using hands and stabilize independently    Standing Unsupported  Able to stand safely 2 minutes    Sitting with  Back Unsupported but Feet Supported on Floor or Stool  Able to sit safely and securely 2 minutes    Stand to Sit  Sits safely with minimal use of hands    Transfers  Able to transfer safely, minor use of hands    Standing Unsupported with Eyes Closed  Able to stand 10 seconds safely    Standing Unsupported with Feet Together  Able to place feet together independently and stand 1 minute safely    From Standing, Reach Forward with Outstretched Arm  Can reach confidently >25 cm (10")    From Standing Position, Pick up Object from Floor  Able to pick up shoe safely and easily    From Standing Position, Turn to Look Behind Over each Shoulder  Looks behind from both sides and weight shifts well    Turn 360 Degrees  Able to turn 360 degrees safely in 4 seconds or less    Standing Unsupported, Alternately Place Feet on Step/Stool  Able to stand independently and safely and complete 8 steps in 20 seconds    Standing Unsupported, One Foot in Front  Able to place foot tandem independently and hold 30 seconds    Standing on One Leg  Able to lift leg independently and hold > 10 seconds    Total Score  56      Functional Gait  Assessment   Gait assessed   Yes    Gait Level Surface  Walks 20 ft in less than 5.5 sec, no assistive devices, good speed, no evidence for imbalance, normal gait pattern, deviates no more than 6 in outside of the  12 in walkway width.    Change in Gait Speed  Able to smoothly change walking speed without loss of balance or gait deviation. Deviate no more than 6 in outside of the 12 in walkway width.    Gait with Horizontal Head Turns  Performs head turns smoothly with no change in gait. Deviates no more than 6 in outside 12 in walkway width    Gait with Vertical Head Turns  Performs head turns with no change in gait. Deviates no more than 6 in outside 12 in walkway width.    Gait and Pivot Turn  Pivot turns safely within 3 sec and stops quickly with no loss of balance.    Step Over Obstacle  Is able to step over 2 stacked shoe boxes taped together (9 in total height) without changing gait speed. No evidence of imbalance.    Gait with Narrow Base of Support  Is able to ambulate for 10 steps heel to toe with no staggering.    Gait with Eyes Closed  Walks 20 ft, uses assistive device, slower speed, mild gait deviations, deviates 6-10 in outside 12 in walkway width. Ambulates 20 ft in less than 9 sec but greater than 7 sec.    Ambulating Backwards  Walks 20 ft, uses assistive device, slower speed, mild gait deviations, deviates 6-10 in outside 12 in walkway width.    Steps  Alternating feet, no rail.    Total Score  28      Prosthetics Assessment - 04/25/20 1345      Prosthetics   Prosthetic Care Independent with  Skin check;Residual limb care;Care of non-amputated limb;Prosthetic cleaning;Ply sock cleaning;Correct ply sock adjustment;Proper wear schedule/adjustment;Proper weight-bearing schedule/adjustment    Prosthetic Care Comments   Long discussion on future prosthesis & technology. PT recommended making list of pros & cons on items / components that he  would like to consider and list of what he likes / dislikes about current prosthesis.  He has right to have next prosthesis at another company which is considering.  Pt verbalized understanding.      Donning prosthesis   Independent    Doffing prosthesis    Independent    Current prosthetic wear tolerance (days/week)   daily    Current prosthetic wear tolerance (#hours/day)   most of awake hours, removing to dry     Current prosthetic weight-bearing tolerance (hours/day)   tolerated standing & gait >30 minutes of session without issues.     Edema  none    Residual limb condition   no issues or open areas.     Prosthesis Description  silicon liner with shuttle pin lock, dynamic response Flex Foot    K code/activity level with prosthetic use   K4 full community with variable cadence, high impact                  [facility] Adult PT Treatment/Exercise - 04/25/20 1345      Therapeutic Activites    Therapeutic Activities  Lifting    Lifting  Pt demo proper technique    Work Simulation  Patient verbalized yard work, weight lifting, stationary cycling with proper technique.                PT Short Term Goals - 03/14/20 1015      PT SHORT TERM GOAL #1   Title  Patient tolerates prosthesis wear >12hrs/ day with no increase in wound size.    Baseline  MET 03/14/2020    Time  4    Period  Weeks    Status  Achieved    Target Date  03/17/20      PT SHORT TERM GOAL #2   Title  Patient verbalizes proper sweat management and phantom pain management.    Baseline  MET 03/14/2020    Time  4    Period  Weeks    Status  Achieved    Target Date  03/17/20      PT SHORT TERM GOAL #3   Title  Berg Balance >52/56 indicating low fall risk.    Time  4    Period  Weeks    Status  On-going    Target Date  03/17/20      PT SHORT TERM GOAL #4   Title  Patient ambulates 500' with prosthesis only with supervision / verbal cues only.    Baseline  MET 03/14/2020    Time  4    Period  Weeks    Status  Achieved    Target Date  03/17/20      PT SHORT TERM GOAL #5   Title  Patient negotiates ramps, curbs & stairs with single rail reciprocally with supervision.    Baseline  MET 03/14/2020    Time  4    Period  Weeks    Status  Achieved     Target Date  03/17/20      PT SHORT TERM GOAL #6   Title  Patient demonstrates proper lifting & carrying 25# and stationary cycling with prosthesis.    Baseline  MET    Time  4    Period  Weeks    Status  Achieved    Target Date  03/17/20        PT Long Term Goals - 04/25/20 2303      PT LONG TERM GOAL #1  Title  Patient tolerates wear of prosthesis >90% of awake hours with skin or limb pain issues to enable function throughout his day.  (All LTGs Target Date: 04/14/2020)    Baseline  MET 04/25/2020    Time  8    Period  Weeks    Status  Achieved      PT LONG TERM GOAL #2   Title  Patient verbalizes & demonstrates understanding of proper prosthetic care to enable safe use of prosthesis.    Baseline  MET 04/25/2020    Time  8    Period  Weeks    Status  Achieved      PT LONG TERM GOAL #3   Title  Functional Gait Assessment >20/30 with prosthesis only.    Baseline  MET 04/25/2020  FGA 28/30 with prosthesis only    Time  8    Period  Weeks    Status  Achieved      PT LONG TERM GOAL #4   Title  Patient ambulates >1000' with prosthesis only including ramps, curbs & stairs independently for full community mobility.    Baseline  MET 04/25/2020    Time  8    Period  Weeks    Status  Achieved      PT LONG TERM GOAL #5   Title  Patient demonstrates ability to lift, carry, ride bike and perform prior functional activities with prosthesis safely.    Baseline  MET 04/25/2020    Time  8    Period  Weeks    Status  Achieved            Plan - 04/25/20 1453    Clinical Impression Statement  Patient met all long term goals. He appears to be functioning with prosthesis only at full community level including yard work, Careers information officer, lifting & working out with [employer] / cardio equipment.  He is an excellent candidate for higher level compnentry like vacuum seal sockets due to his activity level.  The current prosthesis limits his mobility due to loose socket fit & sweating in  liner. Systems that allow minimal air in socket like vacuum control sweat better.    Personal Factors and Comorbidities  Comorbidity 2;Time since onset of injury/illness/exacerbation    Comorbidities  Rt TTA, Herpes Zoster, right radius fx, pulmonary nodule,    Examination-Activity Limitations  Lift;Locomotion Level;Squat;Stairs;Stand;Transfers    Examination-Participation Restrictions  Community Activity;Yard Work;Other   exercising & high activity level   Stability/Clinical Decision Making  Stable/Uncomplicated    Rehab Potential  Good    PT Frequency  2x / week    PT Duration  8 weeks    PT Treatment/Interventions  ADLs/Self Care Home Management;Electrical Stimulation;DME Instruction;Gait training;Stair training;Functional mobility training;Therapeutic activities;Therapeutic exercise;Balance training;Neuromuscular re-education;Patient/family education;Prosthetic Training;Manual techniques;Vestibular    PT Next Visit Plan  discharge PT    Consulted and Agree with Plan of Care  Patient       Patient will benefit from skilled therapeutic intervention in order to improve the following deficits and impairments:  Abnormal gait, Decreased activity tolerance, Decreased balance, Decreased knowledge of use of DME, Decreased mobility, Decreased skin integrity, Postural dysfunction, Prosthetic Dependency, Pain  Visit Diagnosis: Other abnormalities of gait and mobility  Unsteadiness on feet  Abnormal posture  Weakness generalized  Localized edema     Problem List Patient Active Problem List   Diagnosis Date Noted  . Gangrene of right foot ([place]) 12/04/2019  . Subacute osteomyelitis, right ankle and foot ([place])   .  Pilon fracture of right tibia, sequela 06/10/2019  . Displaced pilon fracture of right tibia, initial encounter for open fracture type IIIA, IIIB, or IIIC   . Subacute osteomyelitis of right tibia ([place])   . Surgery, elective   . Anxiety   . Open pilon fracture, right, type  III, initial encounter 03/11/2019  . Closed fracture of right distal radius 03/11/2019  . Type III open pilon fracture of right tibia 03/11/2019  . Pounding heartbeat 01/21/2019  . Precordial chest pain 03/05/2014    Jamey Reas PT, DPT 04/25/2020, 11:09 PM  [facility] Physical Therapy [address] [place], [place], [address] Phone: [phone]   Fax:  [phone]  Name: Jonathan Bass MRN: [id number] Date of Birth: [birth year]

## 2020-05-26 DIAGNOSIS — F411 Generalized anxiety disorder: Secondary | ICD-10-CM | POA: Diagnosis not present

## 2020-05-26 DIAGNOSIS — Z125 Encounter for screening for malignant neoplasm of prostate: Secondary | ICD-10-CM | POA: Diagnosis not present

## 2020-05-26 DIAGNOSIS — S88111S Complete traumatic amputation at level between knee and ankle, right lower leg, sequela: Secondary | ICD-10-CM | POA: Diagnosis not present

## 2020-05-26 DIAGNOSIS — E7800 Pure hypercholesterolemia, unspecified: Secondary | ICD-10-CM | POA: Diagnosis not present

## 2020-08-15 ENCOUNTER — Encounter: Payer: Self-pay | Admitting: Orthopedic Surgery

## 2020-08-16 NOTE — Telephone Encounter (Signed)
Holding for Autumn until Dr. Lajoyce Corners completes paperwork.

## 2020-09-13 DIAGNOSIS — E7800 Pure hypercholesterolemia, unspecified: Secondary | ICD-10-CM | POA: Diagnosis not present

## 2020-09-13 DIAGNOSIS — Z23 Encounter for immunization: Secondary | ICD-10-CM | POA: Diagnosis not present

## 2020-09-13 DIAGNOSIS — Z125 Encounter for screening for malignant neoplasm of prostate: Secondary | ICD-10-CM | POA: Diagnosis not present

## 2020-09-14 ENCOUNTER — Encounter: Payer: Self-pay | Admitting: Orthopedic Surgery

## 2020-09-28 IMAGING — RF DG C-ARM 61-120 MIN
1 series · 7 of 7 positions shown · non-contrast
Comparison: None.

FLUOROSCOPY TIME:  40 seconds

CLINICAL DATA: Removal of external fixation device and placement of
a cast.

EXAM:
RIGHT ANKLE - COMPLETE 3+ VIEW; DG C-ARM 61-120 MIN

[Series 1: run · 7 of 7 slices shown]
[im 1/7]
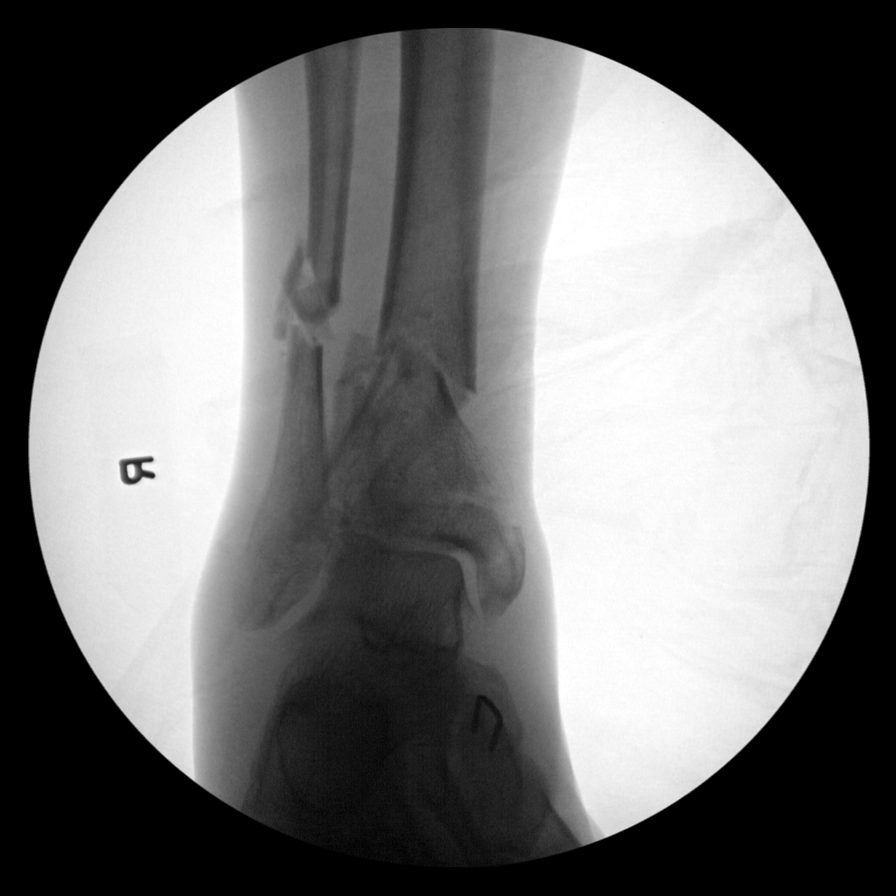
[im 2/7]
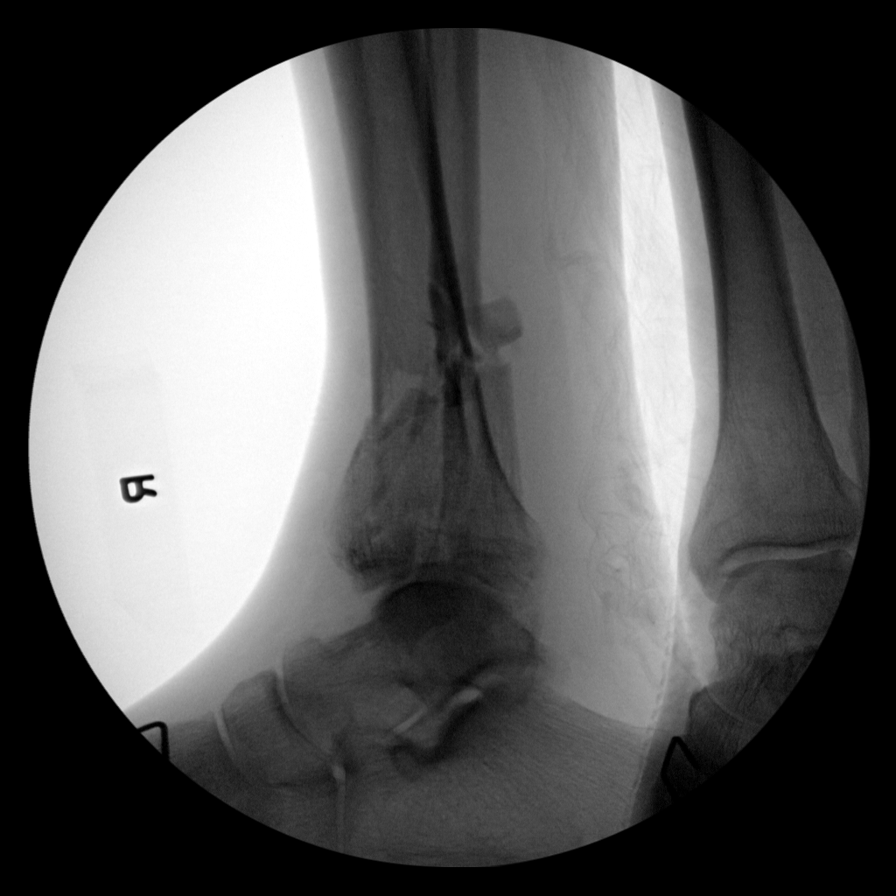
[im 3/7]
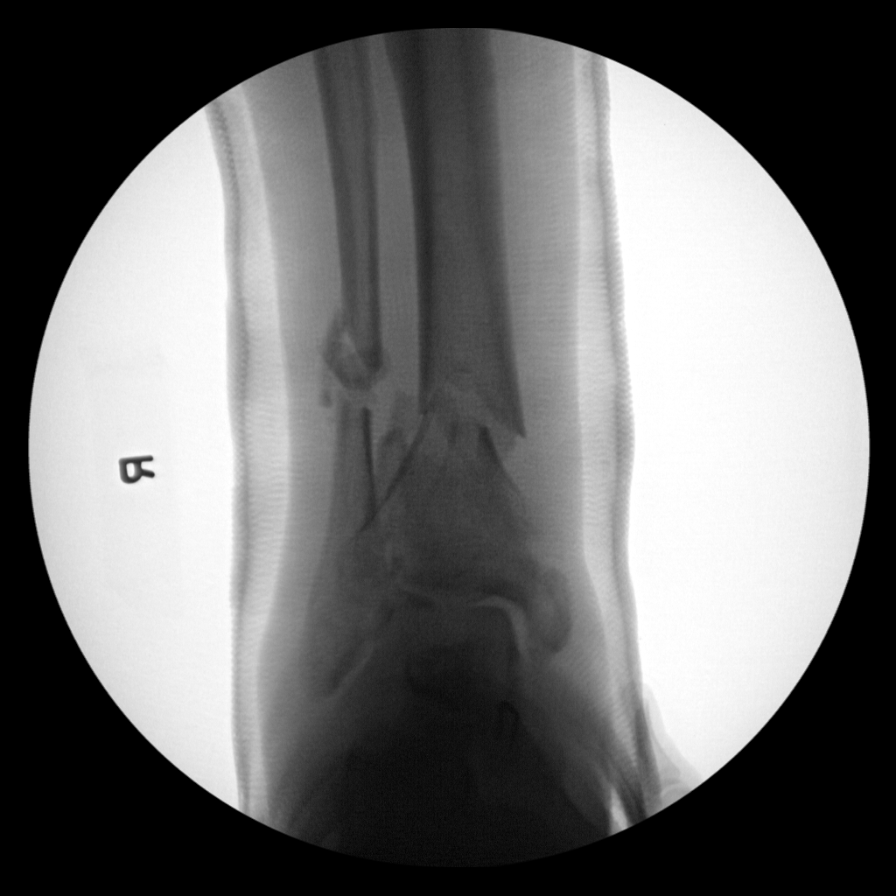
[im 4/7]
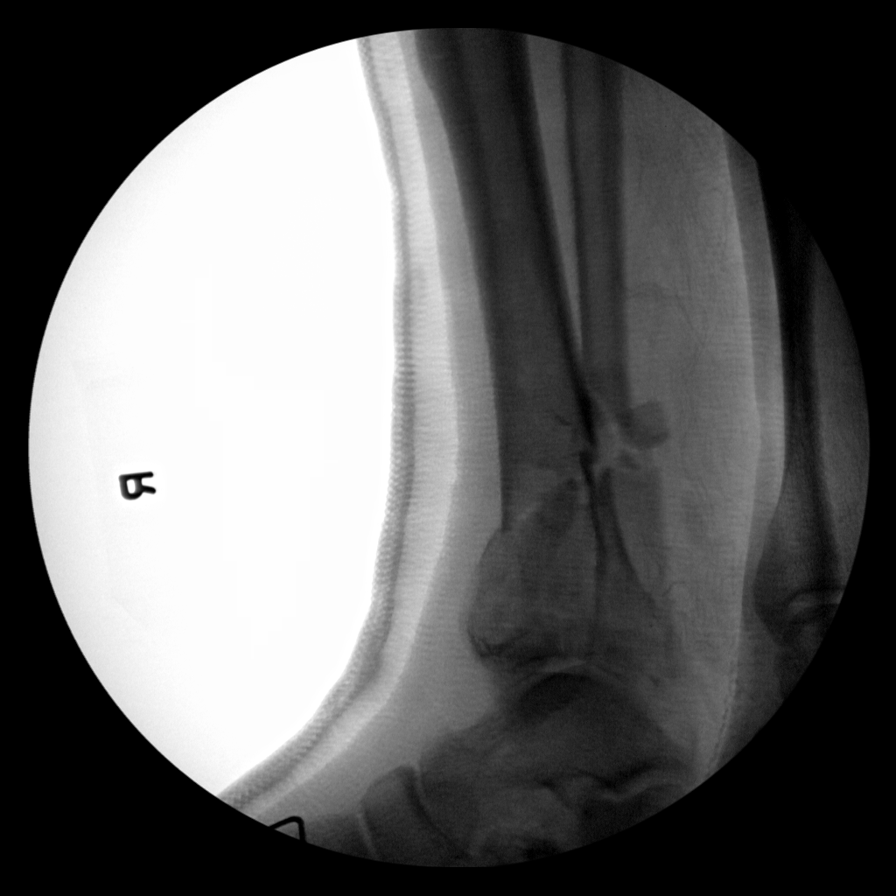
[im 5/7]
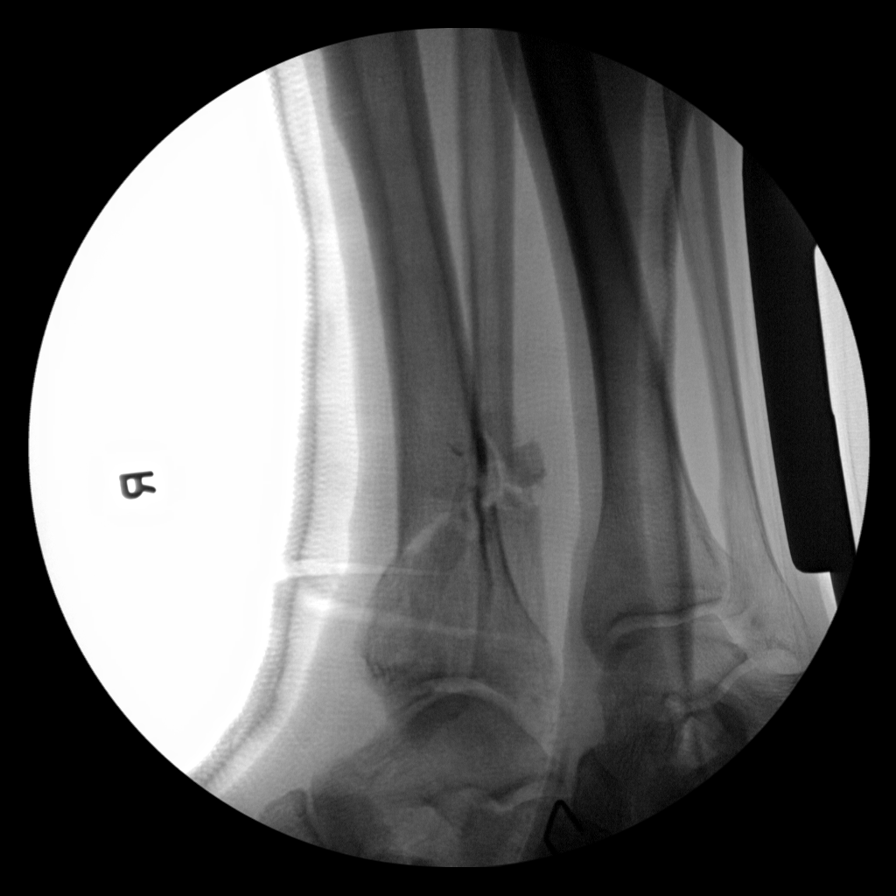
[im 6/7]
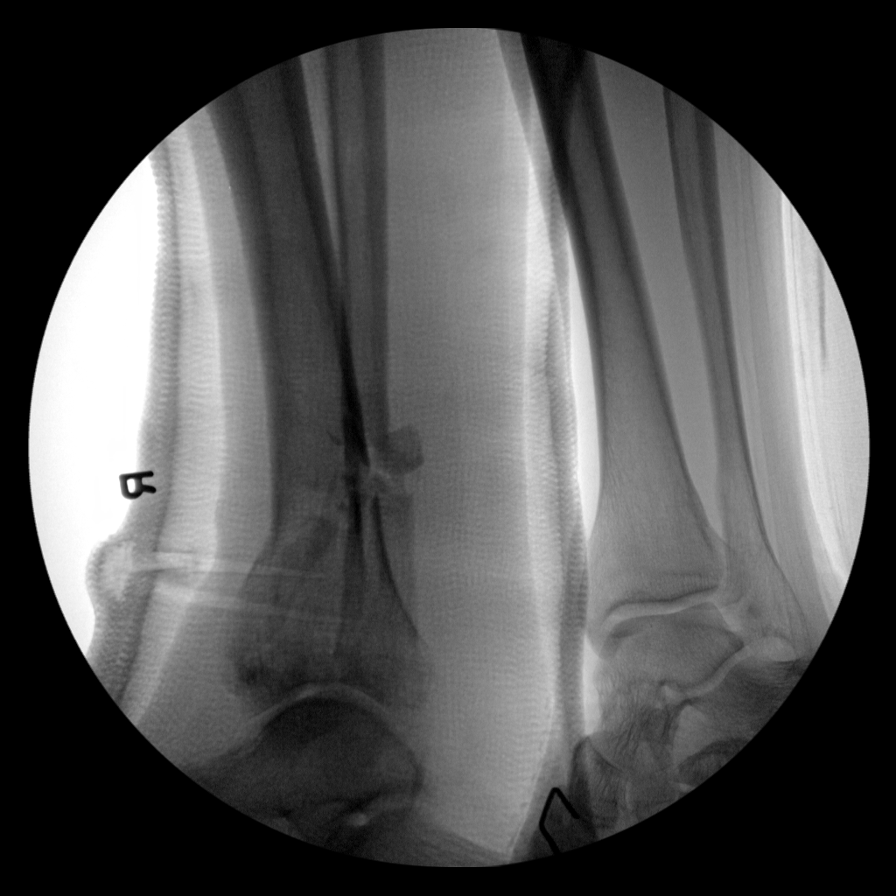
[im 7/7]
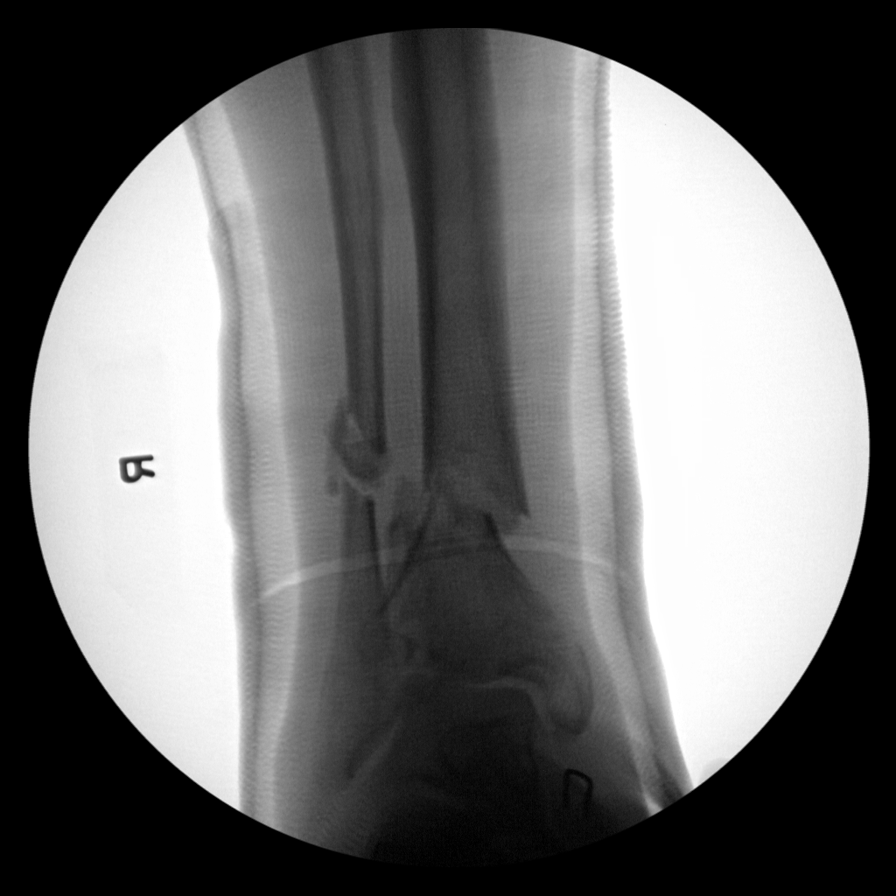

[7 of 7 positions shown; findings below may reference images not displayed]

FINDINGS: Comminuted distal tibial diametaphyseal fracture with mild apex
posterior angulation in near anatomic alignment.

Comminuted distal fibular diaphysis fracture in near anatomic
alignment.

Ankle mortise is grossly intact.  No dislocation.
IMPRESSION: Intraoperative localization as detailed above.

## 2020-10-05 DIAGNOSIS — D225 Melanocytic nevi of trunk: Secondary | ICD-10-CM | POA: Diagnosis not present

## 2020-10-05 DIAGNOSIS — L814 Other melanin hyperpigmentation: Secondary | ICD-10-CM | POA: Diagnosis not present

## 2020-10-05 DIAGNOSIS — D1801 Hemangioma of skin and subcutaneous tissue: Secondary | ICD-10-CM | POA: Diagnosis not present

## 2020-10-05 DIAGNOSIS — L821 Other seborrheic keratosis: Secondary | ICD-10-CM | POA: Diagnosis not present

## 2020-10-17 DIAGNOSIS — D3131 Benign neoplasm of right choroid: Secondary | ICD-10-CM | POA: Diagnosis not present

## 2020-10-31 IMAGING — DX PORTABLE RIGHT ANKLE - 2 VIEW
3 series · 3 of 3 positions shown · non-contrast
Comparison: Fluoroscopic images May 08, 2019. Radiographs of
March 11, 2019.

CLINICAL DATA: Status post surgical repair of right ankle fracture.

EXAM:
PORTABLE RIGHT ANKLE - 2 VIEW

[ankle obl]
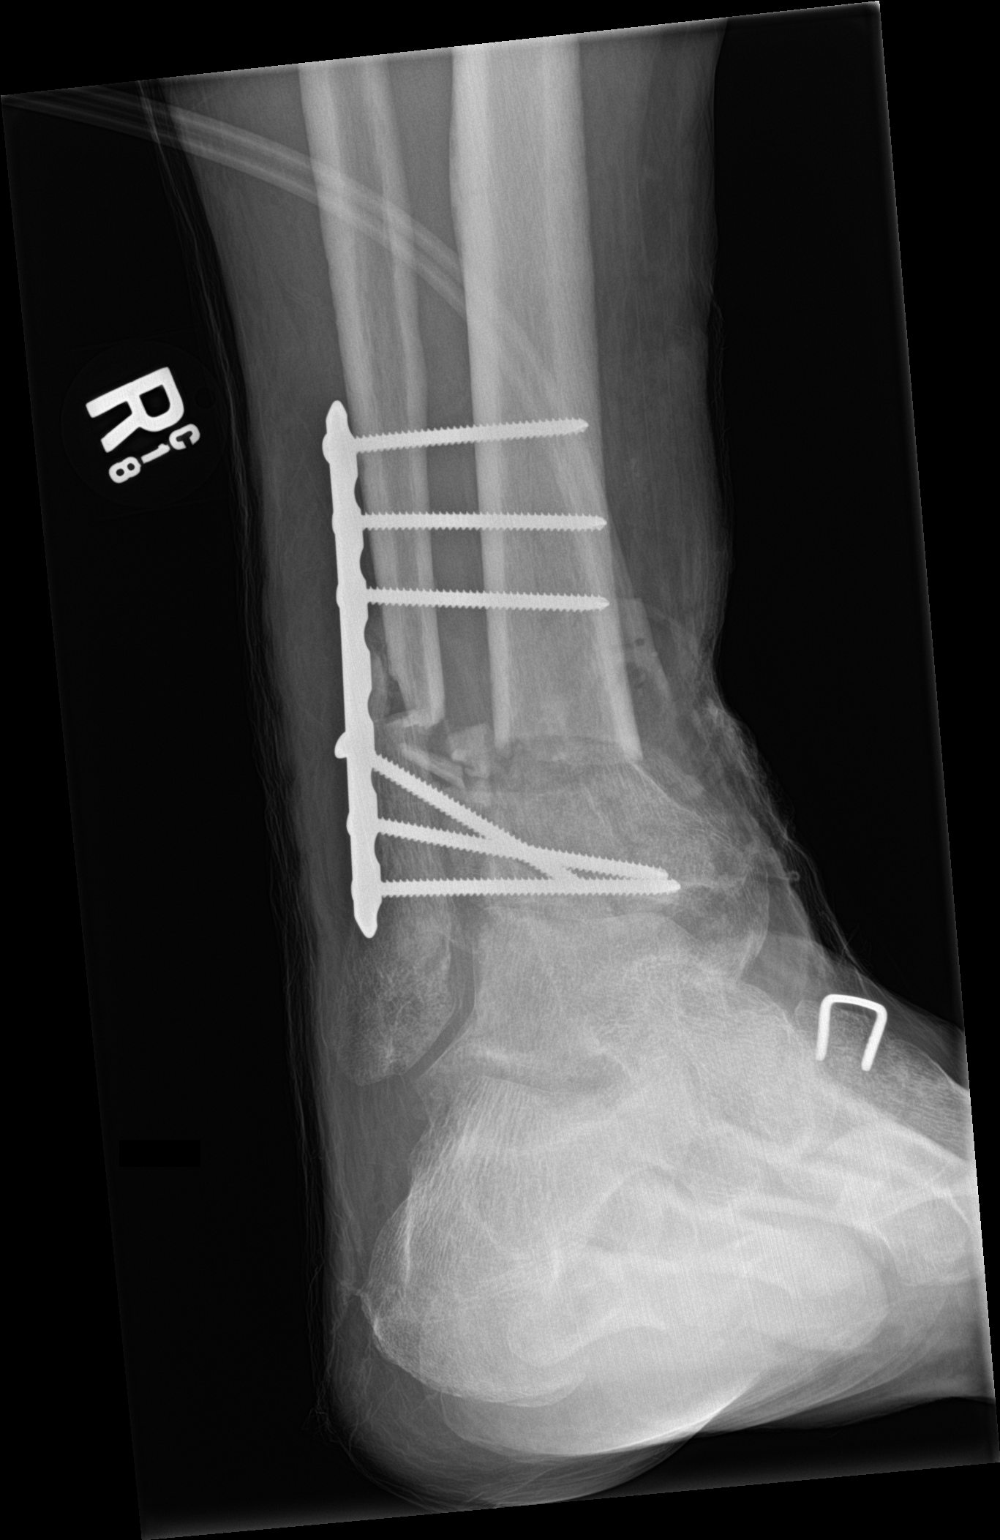

[ankle lat (1 of 2)]
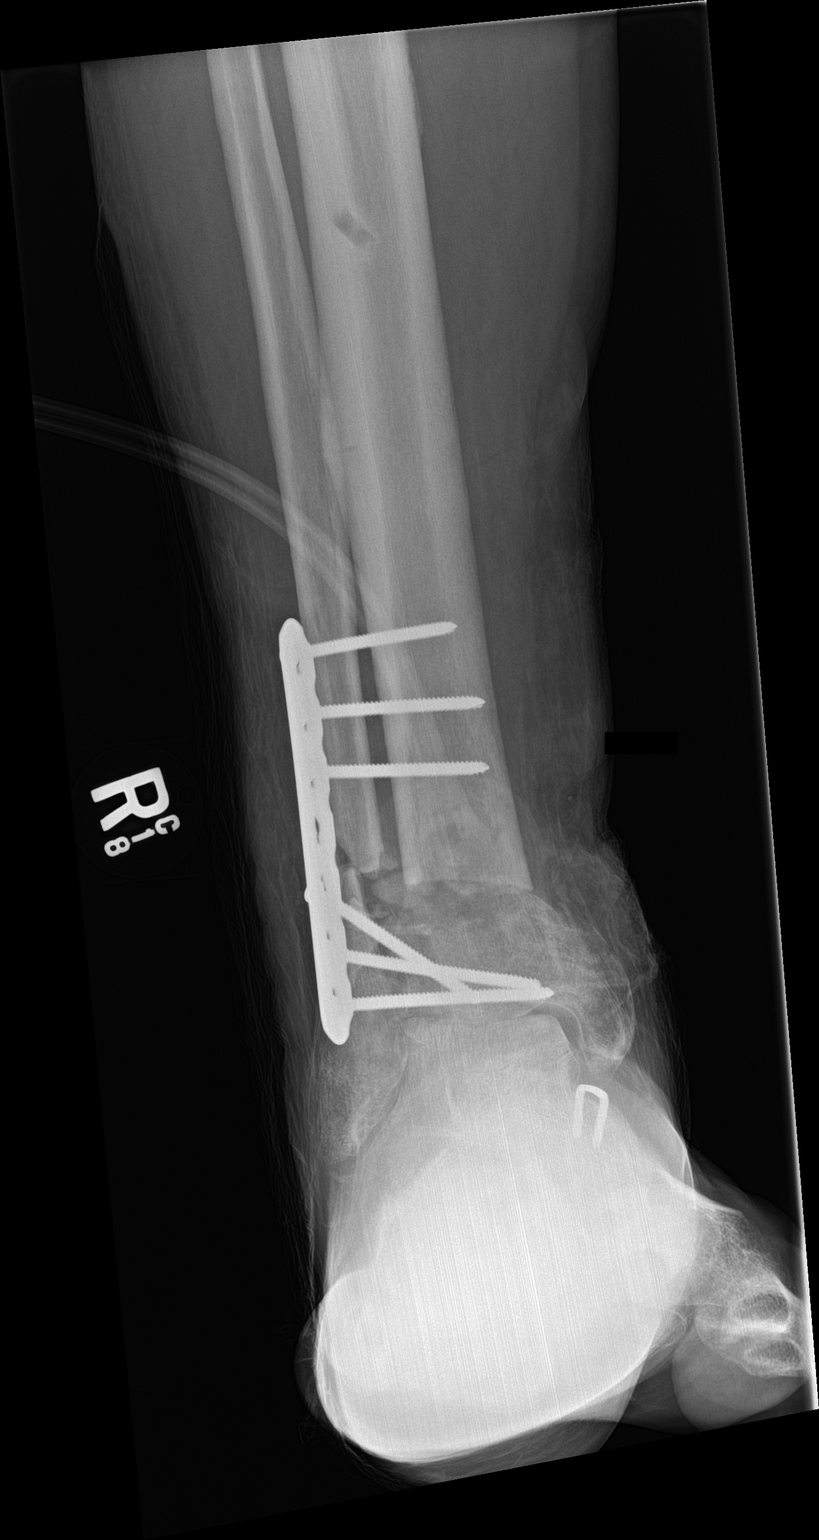

[ankle lat (2 of 2)]
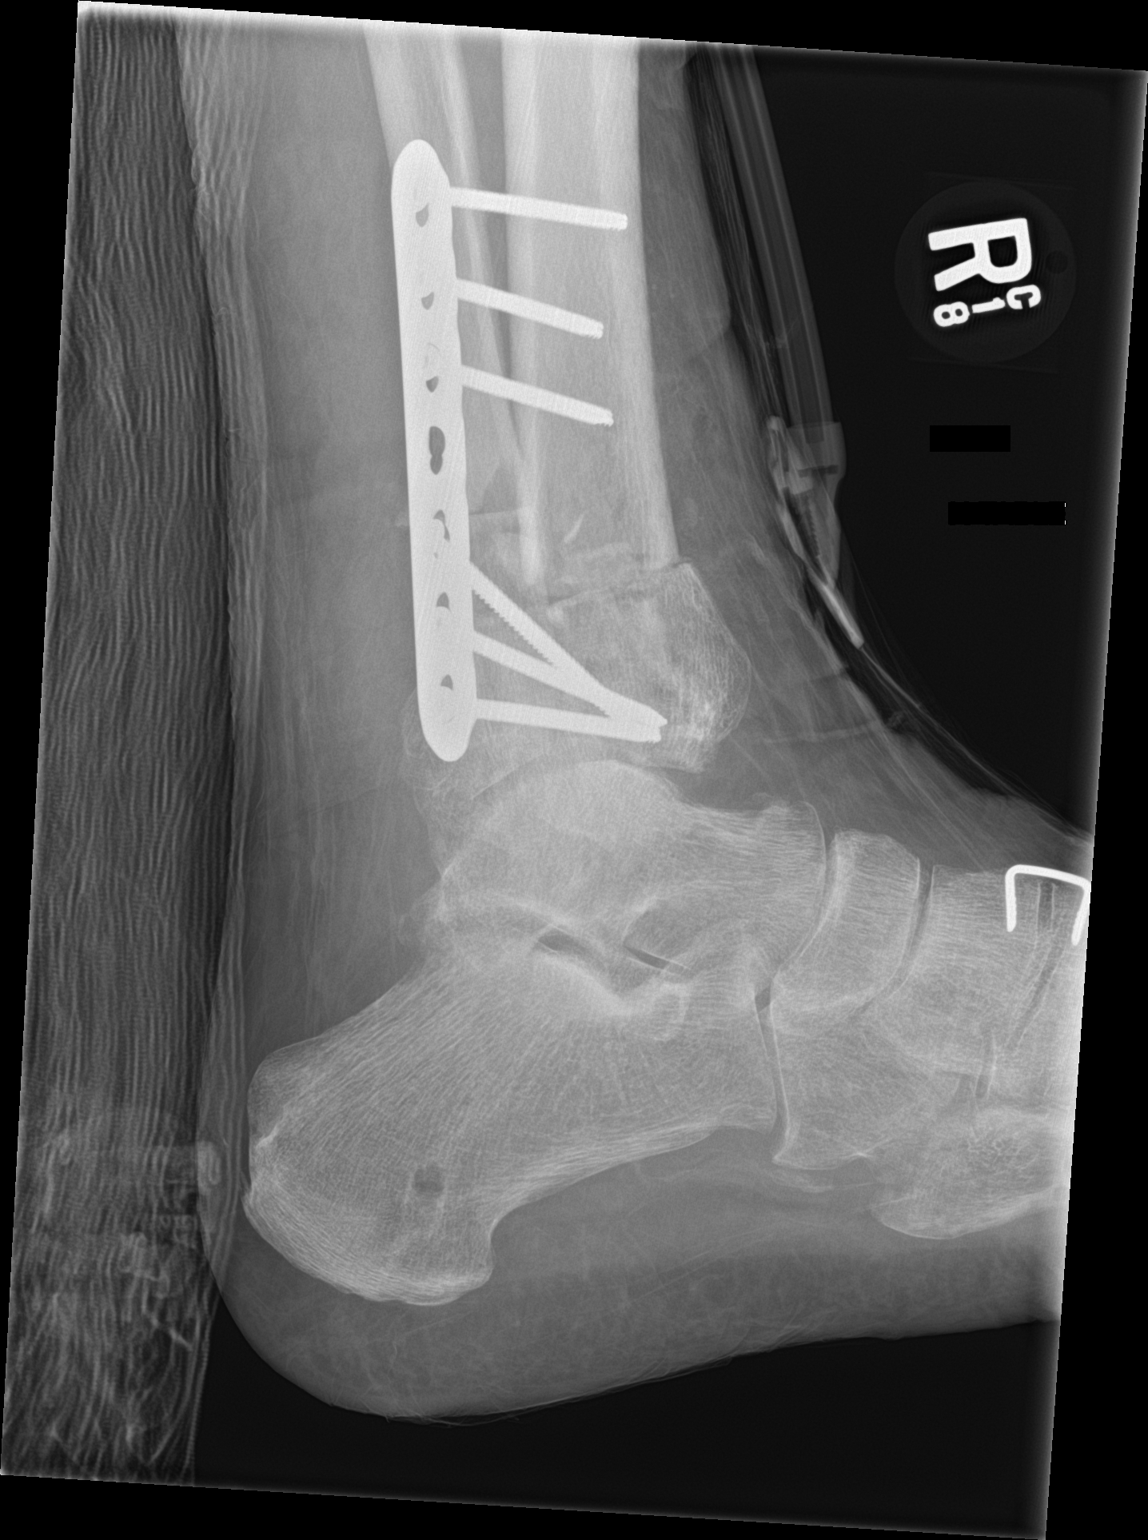

[3 of 3 positions shown; findings below may reference images not displayed]

FINDINGS: Status post surgical internal fixation of comminuted and displaced
distal right fibular and tibial fractures. No significant callus
formation or healing is seen involving these fractures. No
significant soft tissue abnormality is noted.
IMPRESSION: Status post surgical internal fixation of comminuted and displaced
distal right fibular and tibial fractures.

## 2020-11-17 ENCOUNTER — Telehealth: Payer: Self-pay

## 2020-11-17 NOTE — Telephone Encounter (Signed)
[employer] called stating that a new Rx needs to be faxed to [phone] attn: Chyrene.  Stated that last Rx is out dated.  Cb# [phone]. Please advise.  Thank you.

## 2020-11-21 NOTE — Telephone Encounter (Signed)
Rx on desk

## 2020-11-21 NOTE — Telephone Encounter (Signed)
Faxed [facility]. Rx on [employer].

## 2020-11-22 ENCOUNTER — Telehealth: Payer: Self-pay | Admitting: Orthopedic Surgery

## 2020-11-22 NOTE — Telephone Encounter (Signed)
Chyrene with [employer] called to get a referral for the pt to come to the facility for an evaluation of the R lower leg  Fax# [phone] Chyrene CB# [phone]

## 2020-11-23 ENCOUNTER — Other Ambulatory Visit: Payer: Self-pay

## 2020-11-23 ENCOUNTER — Encounter: Payer: Self-pay | Admitting: Orthopedic Surgery

## 2020-11-23 NOTE — Telephone Encounter (Signed)
Faxed order for prosthetic eval and supplies to number below.

## 2020-11-29 IMAGING — XA IR RIGHT FLUORO GUIDE CV LINE
1 series · 1 of 1 positions shown · non-contrast
Comparison: none

INDICATION: 58-year-old male with osteomyelitis currently undergoing outpatient
IV antibiotic therapy. His right upper extremity PICC is partially
displaced. He presents for PICC exchange.

[Series 300: ld dsa body · 1 of 1 slices shown]
[im 1/1]
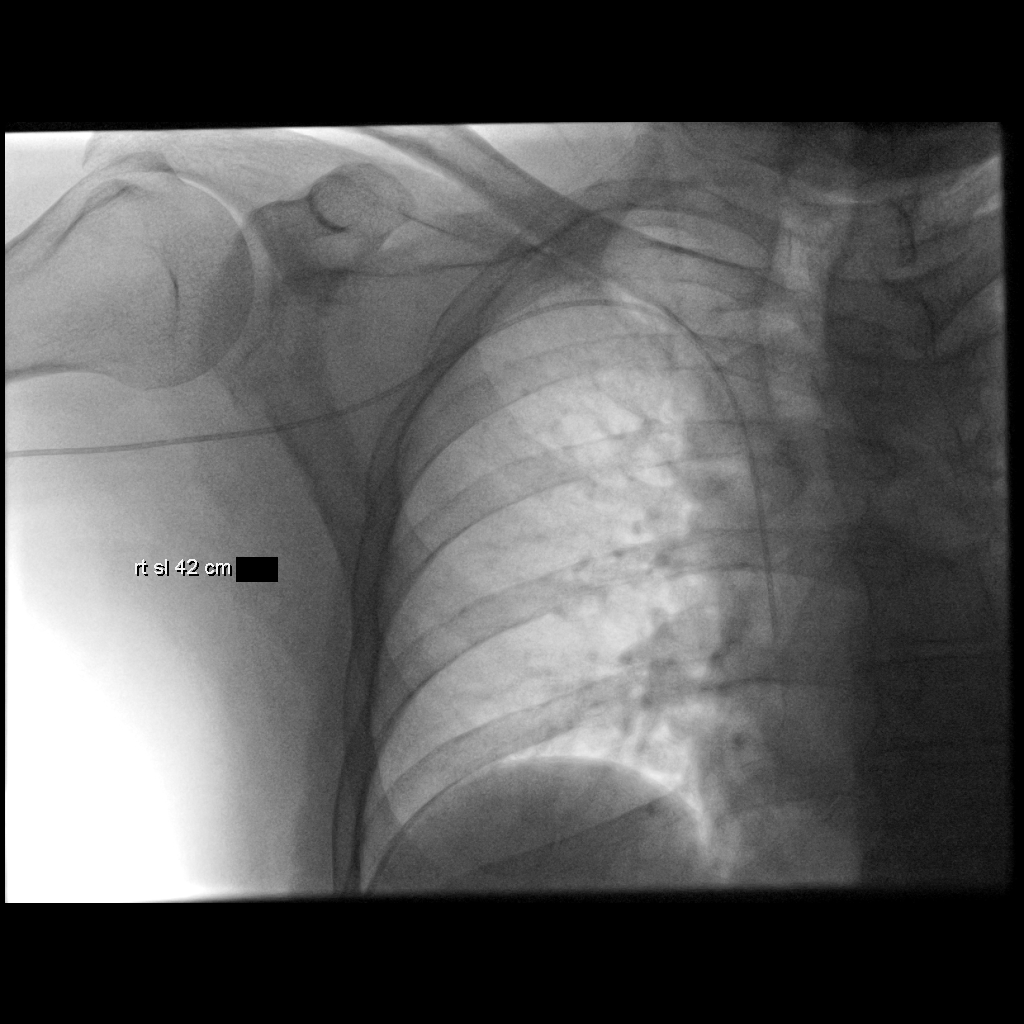

[1 of 1 positions shown; findings below may reference images not displayed]

EXAM:
IR RIGHT FLUORO GUIDE CV LINE

MEDICATIONS:
None

ANESTHESIA/SEDATION:
None

FLUOROSCOPY TIME:  Fluoroscopy Time: 0 minutes 6 seconds (0.6 mGy).

COMPLICATIONS:
None immediate.

PROCEDURE:
Informed written consent was obtained from the patient after a
thorough discussion of the procedural risks, benefits and
alternatives. All questions were addressed. Maximal Sterile Barrier
Technique was utilized including caps, mask, sterile gowns, sterile
gloves, sterile drape, hand hygiene and skin antiseptic. A timeout
was performed prior to the initiation of the procedure.

The existing PICC was evaluated under fluoroscopy. The tip of the
catheter overlies the axillary vein. The catheter was cut close to
the skin entry site and removed over a 0.018 wire. A peel-away
sheath was advanced into the right upper extremity arm vein.

The removed PICC was inspected and found to be 42 cm in length. A
new single-lumen power PICC was cut to 42 cm and advanced through
the peel-away sheath and position with the tip at the superior
cavoatrial junction. The catheter flushes and aspirates with ease.
It was flushed, capped and secured to the arm with an adhesive
fixation device.
IMPRESSION: Successful exchange for a new 42 cm single-lumen power injectable
PICC. The catheter tip is at the cavoatrial junction and ready for
immediate use.

## 2020-12-05 ENCOUNTER — Telehealth: Payer: Self-pay

## 2020-12-05 NOTE — Telephone Encounter (Signed)
Representative from [employer] would like last office note signed and faxed to [phone].  Cb# [id number].  Please advise.  Thank you.

## 2020-12-06 ENCOUNTER — Telehealth: Payer: Self-pay | Admitting: Orthopedic Surgery

## 2020-12-06 NOTE — Telephone Encounter (Signed)
Printed last office visit and had Dr. Lajoyce Corners to sign and faxed per request.

## 2020-12-06 NOTE — Telephone Encounter (Signed)
Only a cover page was received ([facility]) I returned it advising that was only page received. Advised they need to re-send. faxed [phone])

## 2020-12-06 NOTE — Telephone Encounter (Signed)
Did you see this fax come across today?

## 2020-12-06 NOTE — Telephone Encounter (Signed)
Will wait for fax.

## 2020-12-06 NOTE — Telephone Encounter (Signed)
Called stating she faxed a SWO form for Dr.duda to sign and she really needs it faxed back for this pt. She sent the fax to [phone]. Please give her a call if you have any questions.

## 2020-12-07 ENCOUNTER — Telehealth: Payer: Self-pay | Admitting: Orthopedic Surgery

## 2020-12-07 NOTE — Telephone Encounter (Signed)
kirstein with [employer] called stating she needs Dr Public house manager with the paper she faxed this am to be able to file with pts insurance

## 2020-12-07 NOTE — Telephone Encounter (Signed)
Received, sent to Dr. Lajoyce Corners for signature

## 2020-12-07 NOTE — Telephone Encounter (Signed)
Received today will hold for signature. Dr. Lajoyce Corners will return to the office 12/14/20.

## 2020-12-19 NOTE — Telephone Encounter (Signed)
[employer] called stating they are still waiting for the SWO form for Dr Lajoyce Corners to sign.

## 2020-12-19 NOTE — Telephone Encounter (Signed)
Has been advised that this has been completed as instructed last week and medical records will fax.

## 2020-12-29 ENCOUNTER — Telehealth: Payer: Self-pay | Admitting: Orthopedic Surgery

## 2020-12-30 ENCOUNTER — Encounter: Payer: Self-pay | Admitting: Orthopedic Surgery

## 2021-01-04 NOTE — Telephone Encounter (Signed)
E 

## 2021-01-23 ENCOUNTER — Encounter: Payer: Self-pay | Admitting: Orthopedic Surgery

## 2021-01-23 ENCOUNTER — Ambulatory Visit (INDEPENDENT_AMBULATORY_CARE_PROVIDER_SITE_OTHER): Payer: Self-pay | Admitting: Orthopedic Surgery

## 2021-01-23 DIAGNOSIS — Z89511 Acquired absence of right leg below knee: Secondary | ICD-10-CM

## 2021-01-23 NOTE — Progress Notes (Signed)
Office Visit Note   Patient: Jonathan Bass           Date of Birth: Nov 16, 1961           MRN: [id number] Visit Date: 01/23/2021              Requested by: Jonathan Rainier, MD [address] [place],  [place] [address] PCP: Jonathan Rainier, MD  Chief Complaint  Patient presents with  . Right Leg - Follow-up    12/04/2019 right BKA        HPI: Patient is a 60 year old gentleman who is over 1 year out from a right transtibial amputation on 12/04/2019.  Patient has had 2 sockets fabricated and still has pain with end bearing on his prosthesis.  Patient has had the socket modified liner and sleeves modified without resolution of his symptoms patient is still unable to perform his higher level of activities of daily living.  Assessment & Plan: Visit Diagnoses:  1. Acquired absence of right leg below knee [facility])     Plan: Patient is provided a prescription for [employer] to provide a better fitting socket for a K3 level ambulator.  Patient does have a good foot and ankle that should still be useful.  Follow-Up Instructions: Return if symptoms worsen or fail to improve.   Ortho Exam  Patient is alert, oriented, no adenopathy, well-dressed, normal affect, normal respiratory effort. Examination patient has areas of redness and pressure from weightbearing through his socket.  There is no skin breakdown he has good hair growth there are no dystrophic changes.  Patient is an existing right transtibial  amputee.  Patient's current comorbidities are not expected to impact the ability to function with the prescribed prosthesis. Patient verbally communicates a strong desire to use a prosthesis. Patient currently requires mobility aids to ambulate without a prosthesis.  Expects not to use mobility aids with a new prosthesis.  Patient is a K3 level ambulator that spends a lot of time walking around on uneven terrain over obstacles, up and down stairs, and ambulates with a variable  cadence.  Despite modifications to patient's second socket he is unable to perform his activities.  Imaging: No results found. No images are attached to the encounter.  Labs: Lab Results  Component Value Date   ESRSEDRATE 6 08/25/2019   CRP 7.5 08/25/2019   REPTSTATUS 06/17/2019 FINAL 06/12/2019   GRAMSTAIN  06/12/2019    RARE WBC PRESENT, PREDOMINANTLY PMN NO ORGANISMS SEEN    CULT  06/12/2019    RARE PSEUDOMONAS PUTIDA NO ANAEROBES ISOLATED CRITICAL RESULT CALLED TO, READ BACK BY AND VERIFIED WITH: RN Gaynelle Cage  [id number] AT 1315 BY CM Performed at [facility], [address] [place], [place] [address]    LABORGA PSEUDOMONAS PUTIDA 06/12/2019     Lab Results  Component Value Date   ALBUMIN 3.3 (L) 06/14/2019   ALBUMIN 4.0 03/11/2019   ALBUMIN 4.0 01/19/2012    No results found for: MG No results found for: VD25OH  No results found for: PREALBUMIN CBC EXTENDED Latest Ref Rng & Units 12/04/2019 06/10/2019 05/08/2019  WBC 4.0 - 10.5 K/uL 6.1 - 6.4  RBC 4.22 - 5.81 MIL/uL 5.29 - 4.61  HGB 13.0 - 17.0 g/dL 54.0 08.6 76.1  HCT 95.0 - 52.0 % 46.7 - 41.9  PLT 150 - 400 K/uL 225 - 238  NEUTROABS 1.7 - 7.7 K/uL - - -  LYMPHSABS 0.7 - 4.0 K/uL - - -  There is no height or weight on file to calculate BMI.  Orders:  No orders of the defined types were placed in this encounter.  No orders of the defined types were placed in this encounter.    Procedures: No procedures performed  Clinical Data: No additional findings.  ROS:  All other systems negative, except as noted in the HPI. Review of Systems  Objective: Vital Signs: There were no vitals taken for this visit.  Specialty Comments:  No specialty comments available.  PMFS History: Patient Active Problem List   Diagnosis Date Noted  . 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 Subacute osteomyelitis, right ankle and foot (HCC)   . Pilon fracture of right tibia, sequela 06/10/2019  . Displaced  pilon fracture of right tibia, initial encounter for open fracture type IIIA, IIIB, or IIIC   . Subacute osteomyelitis of right tibia (HCC)   . Surgery, elective   . Anxiety   . Open pilon fracture, right, type III, initial encounter 03/11/2019  . Closed fracture of right distal radius 03/11/2019  . Type III open pilon fracture of right tibia 03/11/2019  . Pounding heartbeat 01/21/2019  . Precordial chest pain 03/05/2014   Past Medical History:  Diagnosis Date  . Anxiety   . GERD (gastroesophageal reflux disease)    02/21/2019- not current   . Pulmonary nodule 10/2014   Noted on CT scan -> stable and follow-up 2019.  No further evaluation necessary.    Family History  Problem Relation Age of Onset  . Prostate cancer Father   . Valvular heart disease Father        Had a heart valve replaced (unsure of details)  . Breast cancer Mother   . Parkinson's disease Mother   . Healthy Sister   . Healthy Brother   . Parkinson's disease Maternal Grandmother   . Diabetes Maternal Grandfather     Past Surgical History:  Procedure Laterality Date  . AMPUTATION Right 12/04/2019   Procedure: RIGHT BELOW KNEE AMPUTATION;  Surgeon: Nadara Mustard, MD;  Location: [facility] OR;  Service: Orthopedics;  Laterality: Right;  . APPLICATION OF WOUND VAC Right 06/10/2019   Procedure: Application Of Wound Vac -Prevena;  Surgeon: Nadara Mustard, MD;  Location: [facility] OR;  Service: Orthopedics;  Laterality: Right;  . CARDIAC EVENT MONITOR  01/2019   redominantly NSR with average rate 60 bpm.  One short 4 beat run of NSVT.  Minimal PACs and PVCs noted.  No sustained arrhythmia.  Marland Kitchen CLOSED REDUCTION WRIST FRACTURE Right 03/11/2019   Procedure: CLOSED REDUCTION WRIST;  Surgeon: [name], MD;  Location: [facility] OR;  Service: Orthopedics;  Laterality: Right;  . COLONOSCOPY    . EXTERNAL FIXATION LEG Right 03/11/2019   Procedure: EXTERNAL FIXATION LEG;  Surgeon: [name], MD;  Location: [facility] OR;  Service: Orthopedics;   Laterality: Right;  . EXTERNAL FIXATION REMOVAL Right 05/08/2019   Procedure: REMOVAL EXTERNAL FIXATION ANKLE, PLACEMENT OF CAST;  Surgeon: [name], MD;  Location: [facility] OR;  Service: Orthopedics;  Laterality: Right;  . FOOT SURGERY Bilateral    hammer toe  . HERNIA REPAIR Bilateral 2009   Inguinial  . I & D EXTREMITY Right 03/11/2019   Procedure: IRRIGATION AND DEBRIDEMENT EXTREMITY;  Surgeon: [name], MD;  Location: [facility] OR;  Service: Orthopedics;  Laterality: Right;  . IR FLUORO GUIDE CV LINE RIGHT  07/09/2019  . OPEN REDUCTION INTERNAL FIXATION (ORIF) TIBIA/FIBULA FRACTURE Right 06/10/2019   Procedure:  EXCISION RIGHT TIBIA AND FIBULA, WOUND DEBRIDEMENT, AND FIXATION OF TIBIA AND FIBULA;  Surgeon: Nadara Mustard, MD;  Location: [facility] OR;  Service: Orthopedics;  Laterality: Right;  . OPEN REDUCTION INTERNAL FIXATION (ORIF) TIBIA/FIBULA FRACTURE Right 06/12/2019   Procedure: DEBRIDEMENT AND REVISION INTERNAL FIXATION INFECTED RIGHT PILON FRACTURE;  Surgeon: Nadara Mustard, MD;  Location: [facility] OR;  Service: Orthopedics;  Laterality: Right;  . ORIF ANKLE FRACTURE Right 03/11/2019   Procedure: OPEN REDUCTION INTERNAL FIXATION (ORIF) ANKLE FRACTURE;  Surgeon: [name], MD;  Location: [facility] OR;  Service: Orthopedics;  Laterality: Right;  . ORIF WRIST FRACTURE Right 03/11/2019   Procedure: OPEN REDUCTION INTERNAL FIXATION (ORIF) WRIST FRACTURE;  Surgeon: [name], MD;  Location: [facility] OR;  Service: Orthopedics;  Laterality: Right;  . TONSILLECTOMY    . TRANSTHORACIC ECHOCARDIOGRAM  01/2019   EF 55 to 60%.  Normal wall motion.  Indeterminate likely grade 1 diastolic function.  Mild degenerative mitral valve disease with thickening but no prolapse.   Social History   Occupational History  . Occupation: Production designer, theatre/television/film at [employer]: [employer]  Tobacco Use  . Smoking status: Never Smoker  . Smokeless tobacco: Never Used  Vaping Use  . Vaping Use: Never used  Substance and  Sexual Activity  . Alcohol use: No  . Drug use: No  . Sexual activity: Not on file

## 2021-01-25 ENCOUNTER — Other Ambulatory Visit: Payer: Self-pay

## 2021-01-25 ENCOUNTER — Telehealth: Payer: Self-pay | Admitting: Orthopedic Surgery

## 2021-01-25 ENCOUNTER — Telehealth: Payer: Self-pay

## 2021-01-25 NOTE — Telephone Encounter (Signed)
Faxed with most recent office note.

## 2021-01-25 NOTE — Telephone Encounter (Signed)
Signed STW faxed along with 2/7 note to [facility] [phone]

## 2021-01-25 NOTE — Telephone Encounter (Signed)
Jonathan Bass with [employer] would like a Rx for right BKA prosthesis to be faxed to [phone] attn:Jonathan Bass.  CB# [id number].  Please advise.  Thank you.

## 2021-03-13 DIAGNOSIS — Z01812 Encounter for preprocedural laboratory examination: Secondary | ICD-10-CM | POA: Diagnosis not present

## 2021-03-16 DIAGNOSIS — Z1211 Encounter for screening for malignant neoplasm of colon: Secondary | ICD-10-CM | POA: Diagnosis not present

## 2021-03-16 DIAGNOSIS — K640 First degree hemorrhoids: Secondary | ICD-10-CM | POA: Diagnosis not present

## 2021-03-16 DIAGNOSIS — K5730 Diverticulosis of large intestine without perforation or abscess without bleeding: Secondary | ICD-10-CM | POA: Diagnosis not present

## 2021-04-03 DIAGNOSIS — Z872 Personal history of diseases of the skin and subcutaneous tissue: Secondary | ICD-10-CM | POA: Diagnosis not present

## 2021-04-03 DIAGNOSIS — Z85828 Personal history of other malignant neoplasm of skin: Secondary | ICD-10-CM | POA: Diagnosis not present

## 2021-04-03 DIAGNOSIS — L821 Other seborrheic keratosis: Secondary | ICD-10-CM | POA: Diagnosis not present

## 2021-04-03 DIAGNOSIS — L820 Inflamed seborrheic keratosis: Secondary | ICD-10-CM | POA: Diagnosis not present

## 2021-04-03 DIAGNOSIS — L905 Scar conditions and fibrosis of skin: Secondary | ICD-10-CM | POA: Diagnosis not present

## 2021-05-17 DIAGNOSIS — S88111S Complete traumatic amputation at level between knee and ankle, right lower leg, sequela: Secondary | ICD-10-CM | POA: Diagnosis not present

## 2021-05-17 DIAGNOSIS — F321 Major depressive disorder, single episode, moderate: Secondary | ICD-10-CM | POA: Diagnosis not present

## 2021-10-30 DIAGNOSIS — Z125 Encounter for screening for malignant neoplasm of prostate: Secondary | ICD-10-CM | POA: Diagnosis not present

## 2021-10-30 DIAGNOSIS — E785 Hyperlipidemia, unspecified: Secondary | ICD-10-CM | POA: Diagnosis not present

## 2021-10-31 DIAGNOSIS — F411 Generalized anxiety disorder: Secondary | ICD-10-CM | POA: Diagnosis not present

## 2021-10-31 DIAGNOSIS — F321 Major depressive disorder, single episode, moderate: Secondary | ICD-10-CM | POA: Diagnosis not present

## 2021-10-31 DIAGNOSIS — Z0000 Encounter for general adult medical examination without abnormal findings: Secondary | ICD-10-CM | POA: Diagnosis not present

## 2021-10-31 DIAGNOSIS — R030 Elevated blood-pressure reading, without diagnosis of hypertension: Secondary | ICD-10-CM | POA: Diagnosis not present

## 2021-11-15 DIAGNOSIS — Z03818 Encounter for observation for suspected exposure to other biological agents ruled out: Secondary | ICD-10-CM | POA: Diagnosis not present

## 2021-11-15 DIAGNOSIS — J069 Acute upper respiratory infection, unspecified: Secondary | ICD-10-CM | POA: Diagnosis not present

## 2021-11-20 DIAGNOSIS — H6982 Other specified disorders of Eustachian tube, left ear: Secondary | ICD-10-CM | POA: Diagnosis not present

## 2021-12-13 DIAGNOSIS — D3131 Benign neoplasm of right choroid: Secondary | ICD-10-CM | POA: Diagnosis not present

## 2022-05-28 ENCOUNTER — Emergency Department (HOSPITAL_COMMUNITY): Payer: Managed Care, Other (non HMO)

## 2022-05-28 ENCOUNTER — Emergency Department (HOSPITAL_COMMUNITY)
Admission: EM | Admit: 2022-05-28 | Discharge: 2022-05-28 | Disposition: A | Discharge status: Home / Self Care | Payer: Managed Care, Other (non HMO) | Attending: Emergency Medicine | Admitting: Emergency Medicine

## 2022-05-28 ENCOUNTER — Encounter (HOSPITAL_COMMUNITY): Payer: Self-pay

## 2022-05-28 ENCOUNTER — Other Ambulatory Visit: Payer: Self-pay

## 2022-05-28 DIAGNOSIS — R002 Palpitations: Secondary | ICD-10-CM

## 2022-05-28 DIAGNOSIS — F419 Anxiety disorder, unspecified: Secondary | ICD-10-CM

## 2022-05-28 DIAGNOSIS — R000 Tachycardia, unspecified: Secondary | ICD-10-CM | POA: Diagnosis present

## 2022-05-28 DIAGNOSIS — R61 Generalized hyperhidrosis: Secondary | ICD-10-CM | POA: Insufficient documentation

## 2022-05-28 DIAGNOSIS — Z7982 Long term (current) use of aspirin: Secondary | ICD-10-CM | POA: Diagnosis not present

## 2022-05-28 LAB — CBC WITH DIFFERENTIAL/PLATELET
Abs Immature Granulocytes: 0.06 10*3/uL (ref 0.00–0.07)
Basophils Absolute: 0.1 10*3/uL (ref 0.0–0.1)
Basophils Relative: 1 %
Eosinophils Absolute: 0.2 10*3/uL (ref 0.0–0.5)
Eosinophils Relative: 4 %
HCT: 45.3 % (ref 39.0–52.0)
Hemoglobin: 15 g/dL (ref 13.0–17.0)
Immature Granulocytes: 1 %
Lymphocytes Relative: 42 %
Lymphs Abs: 2.5 10*3/uL (ref 0.7–4.0)
MCH: 31 pg (ref 26.0–34.0)
MCHC: 33.1 g/dL (ref 30.0–36.0)
MCV: 93.6 fL (ref 80.0–100.0)
Monocytes Absolute: 0.4 10*3/uL (ref 0.1–1.0)
Monocytes Relative: 7 %
Neutro Abs: 2.7 10*3/uL (ref 1.7–7.7)
Neutrophils Relative %: 45 %
Platelets: 196 10*3/uL (ref 150–400)
RBC: 4.84 MIL/uL (ref 4.22–5.81)
RDW: 12.7 % (ref 11.5–15.5)
WBC: 6 10*3/uL (ref 4.0–10.5)
nRBC: 0 % (ref 0.0–0.2)

## 2022-05-28 LAB — BASIC METABOLIC PANEL
Anion gap: 9 (ref 5–15)
BUN: 16 mg/dL (ref 8–23)
CO2: 22 mmol/L (ref 22–32)
Calcium: 8.8 mg/dL — ABNORMAL LOW (ref 8.9–10.3)
Chloride: 109 mmol/L (ref 98–111)
Creatinine, Ser: 1.18 mg/dL (ref 0.61–1.24)
GFR, Estimated: >60 mL/min (ref >60)
Glucose, Bld: 101 mg/dL — ABNORMAL HIGH (ref 70–99)
Potassium: 3.7 mmol/L (ref 3.5–5.1)
Sodium: 140 mmol/L (ref 135–145)

## 2022-05-28 LAB — TSH: TSH: 3.341 u[IU]/mL (ref 0.350–4.500)

## 2022-05-28 LAB — TROPONIN I (HIGH SENSITIVITY)
Troponin I (High Sensitivity): 4 ng/L (ref <18)
Troponin I (High Sensitivity): 3 ng/L (ref <18)

## 2022-05-28 NOTE — ED Provider Notes (Signed)
[facility] EMERGENCY DEPARTMENT Provider Note   CSN: [id number] Arrival date & time: 05/28/22  0550     History  Chief Complaint  Patient presents with   Palpitations    Jonathan Bass is a 61 y.o. male.  Patient as above with significant medical history as below, including right-sided BKA, anxiety, GERD who presents to the ED with complaint of palpitations.  Symptoms ongoing approximately 2 weeks.  Occur intermittently, associated with anxiety.  Diaphoresis.  Palpitations described as regular.  Palpitations will resolve with as needed propranolol.  Last episode was this morning when he woke up.  No chest pain, dyspnea, nausea or vomiting.  No change in bowel or bladder function.  No recent medication or diet changes.  No recent excessive stimulant use  Follows with Dr. Herbie Baltimore cardiology     Past Medical History:  Diagnosis Date   Anxiety    GERD (gastroesophageal reflux disease)    02/21/2019- not current    Pulmonary nodule 10/2014   Noted on CT scan -> stable and follow-up 2019.  No further evaluation necessary.    Past Surgical History:  Procedure Laterality Date   AMPUTATION Right 12/04/2019   Procedure: RIGHT BELOW KNEE AMPUTATION;  Surgeon: Nadara Mustard, MD;  Location: [facility] OR;  Service: Orthopedics;  Laterality: Right;   APPLICATION OF WOUND VAC Right 06/10/2019   Procedure: Application Of Wound Vac -Prevena;  Surgeon: Nadara Mustard, MD;  Location: [facility] OR;  Service: Orthopedics;  Laterality: Right;   CARDIAC EVENT MONITOR  01/2019   redominantly NSR with average rate 60 bpm.  One short 4 beat run of NSVT.  Minimal PACs and PVCs noted.  No sustained arrhythmia.   CLOSED REDUCTION WRIST FRACTURE Right 03/11/2019   Procedure: CLOSED REDUCTION WRIST;  Surgeon: [name], MD;  Location: [facility] OR;  Service: Orthopedics;  Laterality: Right;   COLONOSCOPY     EXTERNAL FIXATION LEG Right 03/11/2019   Procedure: EXTERNAL FIXATION LEG;  Surgeon: [name], MD;  Location: [facility] OR;  Service: Orthopedics;  Laterality: Right;   EXTERNAL FIXATION REMOVAL Right 05/08/2019   Procedure: REMOVAL EXTERNAL FIXATION ANKLE, PLACEMENT OF CAST;  Surgeon: [name], MD;  Location: [facility] OR;  Service: Orthopedics;  Laterality: Right;   FOOT SURGERY Bilateral    hammer toe   HERNIA REPAIR Bilateral 2009   Inguinial   I & D EXTREMITY Right 03/11/2019   Procedure: IRRIGATION AND DEBRIDEMENT EXTREMITY;  Surgeon: [name], MD;  Location: [facility] OR;  Service: Orthopedics;  Laterality: Right;   IR FLUORO GUIDE CV LINE RIGHT  07/09/2019   OPEN REDUCTION INTERNAL FIXATION (ORIF) TIBIA/FIBULA FRACTURE Right 06/10/2019   Procedure: EXCISION RIGHT TIBIA AND FIBULA, WOUND DEBRIDEMENT, AND FIXATION OF TIBIA AND FIBULA;  Surgeon: Nadara Mustard, MD;  Location: [facility] OR;  Service: Orthopedics;  Laterality: Right;   OPEN REDUCTION INTERNAL FIXATION (ORIF) TIBIA/FIBULA FRACTURE Right 06/12/2019   Procedure: DEBRIDEMENT AND REVISION INTERNAL FIXATION INFECTED RIGHT PILON FRACTURE;  Surgeon: Nadara Mustard, MD;  Location: [facility] OR;  Service: Orthopedics;  Laterality: Right;   ORIF ANKLE FRACTURE Right 03/11/2019   Procedure: OPEN REDUCTION INTERNAL FIXATION (ORIF) ANKLE FRACTURE;  Surgeon: [name], MD;  Location: [facility] OR;  Service: Orthopedics;  Laterality: Right;   ORIF WRIST FRACTURE Right 03/11/2019   Procedure: OPEN REDUCTION INTERNAL FIXATION (ORIF) WRIST FRACTURE;  Surgeon: [name], MD;  Location: [facility] OR;  Service: Orthopedics;  Laterality: Right;  TONSILLECTOMY     TRANSTHORACIC ECHOCARDIOGRAM  01/2019   EF 55 to 60%.  Normal wall motion.  Indeterminate likely grade 1 diastolic function.  Mild degenerative mitral valve disease with thickening but no prolapse.     The history is provided by the patient. No language interpreter was used.  Palpitations Associated symptoms: no chest pain, no cough, no nausea, no shortness of breath and no vomiting         Home Medications Prior to Admission medications   Medication Sig Start Date End Date Taking? Authorizing Provider  acetaminophen (TYLENOL) 650 MG CR tablet Take 650 mg by mouth every 8 (eight) hours as needed for pain.    [provider]  aspirin EC 81 MG tablet Take 81 mg by mouth daily.    [provider]  Cholecalciferol (VITAMIN D3) 125 MCG (5000 UT) TABS Take 1 tablet (5,000 Units total) by mouth daily. 03/14/19   Montez Morita, PA-C  gabapentin (NEURONTIN) 300 MG capsule Take 1 capsule (300 mg total) by mouth 3 (three) times daily. 02/05/20   Persons, [place], PA  methocarbamol (ROBAXIN) 500 MG tablet Take 1 tablet (500 mg total) by mouth every 6 (six) hours as needed for muscle spasms. 12/17/19   Persons, [place], PA  Multiple Vitamin (MULTIVITAMIN WITH MINERALS) TABS tablet Take 1 tablet by mouth daily.    [provider]  Omega-3 Fatty Acids (FISH OIL PO) Take 1 capsule by mouth daily.    [provider]  oxyCODONE (OXY IR/ROXICODONE) 5 MG immediate release tablet Take 1 tablet (5 mg total) by mouth every 6 (six) hours as needed for moderate pain (pain score 4-6). 01/25/20   Nadara Mustard, MD  propranolol (INDERAL) 10 MG tablet Take 10 mg by mouth 2 (two) times daily as needed (anxiety).     [provider]  sertraline (ZOLOFT) 100 MG tablet Take 100 mg by mouth daily.     [provider]  vitamin C (VITAMIN C) 500 MG tablet Take 1 tablet (500 mg total) by mouth daily. 03/14/19   Montez Morita, PA-C      Allergies    Patient has no known allergies.    Review of Systems   Review of Systems  Constitutional:  Negative for chills and fever.  HENT:  Negative for facial swelling and trouble swallowing.   Eyes:  Negative for photophobia and visual disturbance.  Respiratory:  Negative for cough and shortness of breath.   Cardiovascular:  Positive for palpitations. Negative for chest pain.  Gastrointestinal:  Negative for abdominal pain,  nausea and vomiting.  Endocrine: Negative for polydipsia and polyuria.  Genitourinary:  Negative for difficulty urinating and hematuria.  Musculoskeletal:  Negative for gait problem and joint swelling.  Skin:  Negative for pallor and rash.  Neurological:  Negative for syncope and headaches.  Psychiatric/Behavioral:  Negative for agitation and confusion. The patient is nervous/anxious.     Physical Exam Updated Vital Signs BP (!) 142/85 (BP Location: Right Arm)   Pulse 61   Temp 97.9 F (36.6 C) (Oral)   Resp 14   Ht 5\' 11"  (1.803 m)   Wt 88.5 kg   SpO2 98%   BMI 27.20 kg/m  Physical Exam Vitals and nursing note reviewed.  Constitutional:      General: He is not in acute distress.    Appearance: Normal appearance. He is well-developed.  HENT:     Head: Normocephalic and atraumatic.     Right Ear:  External ear normal.     Left Ear: External ear normal.     Mouth/Throat:     Mouth: Mucous membranes are moist.  Eyes:     General: No scleral icterus. Cardiovascular:     Rate and Rhythm: Normal rate and regular rhythm.     Pulses: Normal pulses.          Radial pulses are 2+ on the right side and 2+ on the left side.       Dorsalis pedis pulses are 2+ on the left side. Right dorsalis pedis pulse not accessible.     Heart sounds: Normal heart sounds.  Pulmonary:     Effort: Pulmonary effort is normal. No respiratory distress.     Breath sounds: Normal breath sounds.  Abdominal:     General: Abdomen is flat.     Palpations: Abdomen is soft.     Tenderness: There is no abdominal tenderness. There is no guarding or rebound.  Musculoskeletal:        General: Normal range of motion.     Cervical back: Normal range of motion.     Left lower leg: No edema.     Comments: Status post right-sided BKA  DP pulse palpable left  Skin:    General: Skin is warm and dry.     Capillary Refill: Capillary refill takes less than 2 seconds.  Neurological:     Mental Status: He is alert  and oriented to person, place, and time.  Psychiatric:        Mood and Affect: Mood normal.        Behavior: Behavior normal.     ED Results / Procedures / Treatments   Labs (all labs ordered are listed, but only abnormal results are displayed) Labs Reviewed  BASIC METABOLIC PANEL - Abnormal; Notable for the following components:      Result Value   Glucose, Bld 101 (*)    Calcium 8.8 (*)    All other components within normal limits  CBC WITH DIFFERENTIAL/PLATELET  TSH  TROPONIN I (HIGH SENSITIVITY)  TROPONIN I (HIGH SENSITIVITY)    EKG EKG Interpretation  Date/Time:  Monday May 28 2022 05:58:48 EDT Ventricular Rate:  55 PR Interval:  202 QRS Duration: 94 QT Interval:  426 QTC Calculation: 407 R Axis:   36 Text Interpretation: Sinus bradycardia Otherwise normal ECG When compared with ECG of 11-Mar-2019 16:54, PREVIOUS ECG IS PRESENT similar to prior tracing no stemi Confirmed by Tanda Rockers ([id number]) on 05/28/2022 11:35:03 AM  Radiology DG Chest 2 View  Result Date: 05/28/2022 CLINICAL DATA:  Palpitations for 2 weeks EXAM: CHEST - 2 VIEW COMPARISON:  07/09/2019 FINDINGS: Normal heart size and mediastinal contours. No acute infiltrate or edema. No effusion or pneumothorax. No acute osseous findings. IMPRESSION: No active cardiopulmonary disease. Electronically Signed   By: Tiburcio Pea M.D.   On: 05/28/2022 06:45    Procedures Procedures    Medications Ordered in ED Medications - No data to display  ED Course/ Medical Decision Making/ A&P                           Medical Decision Making   CC: Palpitations  This patient presents to the Emergency Department for the above complaint. This involves an extensive number of treatment options and is a complaint that carries with it a high risk of complications and morbidity. Vital signs were reviewed. Serious etiologies considered.  DDx includes but  not limited to A-fib, SVT, paroxysmal A-fib, atrial tachycardia,  anxiety, thyroid abnormality, ACS, electrolyte disturbance, other acute abnormalities considered  Record review:  Previous records obtained and reviewed  Prior visits, prior office visits, prior test results, prior labs and imaging Echo reviewed from 2020, normal LVEF 55 to 60%, grade 1 diastolic dysfunction.  Additional history obtained from N/A  Medical and surgical history as noted above.   Work up as above, notable for:  Labs & imaging results that were available during my care of the patient were visualized by me and considered in my medical decision making.   I ordered imaging studies which included chest x-ray. I visualized the imaging, interpreted images, and I agree with radiologist interpretation.  No acute abnormalities  Cardiac monitoring reviewed and interpreted personally which shows NSR  Labs reviewed and are stable.  ECG without evidence of arrhythmia or acute ischemia.  Troponin negative.  TSH within normal limits, labs stable.   Reassessment:  Patient remains asymptomatic.  No arrhythmias appreciated on telemetry  Admission was considered.    Suspicion for anxiety is underlying component of patient's palpitations.  Would favor follow-up with cardiology for event monitor/Holter monitor.  He has been placed on one in the past around 3 years ago which was unremarkable per the patient.  He follows with Dr. Herbie Baltimore.  Outpatient follow-up ordered.  The patient improved significantly and was discharged in stable condition. Detailed discussions were had with the patient regarding current findings, and need for close f/u with PCP or on call doctor. The patient has been instructed to return immediately if the symptoms worsen in any way for re-evaluation. Patient verbalized understanding and is in agreement with current care plan. All questions answered prior to discharge.              Social determinants of health include -  No PCP-PCP recently retired Social  History   Socioeconomic History   Marital status: Married    Spouse name: Not on file   Number of children: 3   Years of education: 16   Highest education level: Bachelor's degree (e.g., BA, AB, BS)  Occupational History   Occupation: Production designer, theatre/television/film at [employer]: [employer]  Tobacco Use   Smoking status: Never   Smokeless tobacco: Never  Vaping Use   Vaping Use: Never used  Substance and Sexual Activity   Alcohol use: No   Drug use: No   Sexual activity: Not on file  Other Topics Concern   Not on file  Social History Narrative   He has been married for 37 years.  He lives with his wife.  They have 3 children and 3 grandchildren.      He says he exercises just about every day.  He does alternating upper and lower body weights as well as about 30 to 40 minutes of cardio exercise either doing stationary bicycle, elliptical or walking on treadmill.      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  He thinks this may be where he bothered his right arm.   Social Determinants of Health   Financial Resource Strain: Not on file  Food Insecurity: Not on file  Transportation Needs: Not on file  Physical Activity: Not on file  Stress: Not on file  Social Connections: Not on file  Intimate Partner  Violence: Not on file      This chart was dictated using voice recognition software.  Despite best efforts to proofread,  errors can occur which can change the documentation meaning.         Final Clinical Impression(s) / ED Diagnoses Final diagnoses:  None    Rx / DC Orders ED Discharge Orders     None         Sloan Leiter, DO 05/30/22 0715

## 2022-05-28 NOTE — ED Provider Triage Note (Signed)
Emergency Medicine Provider Triage Evaluation Note  Jonathan Bass , a 61 y.o. male  was evaluated in triage.  Pt complains of heart racing.  States present over the past few days, describes it as a "nervous" feeling.  No real chest pain or SOB.  Review of Systems  Positive: palpitations Negative: fever  Physical Exam  BP (!) 179/98   Pulse (!) 56   Temp 97.6 F (36.4 C) (Oral)   Resp 16   SpO2 100%  Gen:   Awake, no distress   Resp:  Normal effort  MSK:   Moves extremities without difficulty  Other:  HR NSR in the 50's-60's during exam  Medical Decision Making  Medically screening exam initiated at 5:57 AM.  Appropriate orders placed.  EBB DAVIDHEISER was informed that the remainder of the evaluation will be completed by another provider, this initial triage assessment does not replace that evaluation, and the importance of remaining in the ED until their evaluation is complete.  Palpitations.  No real chest pain.  HR stable in triage.  EKG, labs, CXR.    Larene Pickett, PA-C 05/28/22 [phone]

## 2022-05-28 NOTE — Discharge Instructions (Signed)
It was a pleasure caring for you today in the emergency department. ° °Please return to the emergency department for any worsening or worrisome symptoms. ° ° °

## 2022-05-28 NOTE — ED Triage Notes (Signed)
Intermittent "heart racing" sensation for last couple weeks.

## 2022-06-12 ENCOUNTER — Encounter: Payer: Self-pay | Admitting: *Deleted

## 2022-06-13 ENCOUNTER — Ambulatory Visit (INDEPENDENT_AMBULATORY_CARE_PROVIDER_SITE_OTHER): Payer: BC Managed Care – PPO | Admitting: Family

## 2022-06-13 ENCOUNTER — Encounter: Payer: Self-pay | Admitting: Family

## 2022-06-13 DIAGNOSIS — Z89511 Acquired absence of right leg below knee: Secondary | ICD-10-CM

## 2022-06-13 NOTE — Progress Notes (Signed)
Office Visit Note   Patient: Jonathan Bass           Date of Birth: 02/25/1961           MRN: [id number] Visit Date: 06/13/2022              Requested by: No referring provider defined for this encounter. PCP: Juluis Rainier, MD (Inactive)  Chief Complaint  Patient presents with   Right Leg - Follow-up    Hx right BKA, needs new script      HPI: The patient is a 61 year old gentleman who presents today for evaluation of his right residual limb.  The patient is status post right below-knee amputation his current socket is ill fitting due to volume loss he is wearing significant layers of ply but states he does not feel safe weightbearing and with his activities of daily living due to poor fit  The patient is quite active he would like to be set up with a prosthesis that allows him to resume activities such as downhill skiing and waterskiing.  He currently only has 1 prosthesis is eager to get set up with a water leg and/or modifications to a prosthesis that will allow him to downhill ski and resume a more active lifestyle.  Patient is an existing right transtibial  amputee.  Patient's current comorbidities are not expected to impact the ability to function with the prescribed prosthesis. Patient verbally communicates a strong desire to use a prosthesis. Patient currently requires mobility aids to ambulate without a prosthesis.  Expects not to use mobility aids with a new prosthesis.  Patient is a K3 level ambulator that spends a lot of time walking around on uneven terrain over obstacles, up and down stairs, and ambulates with a variable cadence.    Assessment & Plan: Visit Diagnoses:  1. Acquired absence of right leg below knee [facility])     Plan: Order given for new prosthesis set up level K3.  Follow-Up Instructions: Return if symptoms worsen or fail to improve.   Ortho Exam  Patient is alert, oriented, no adenopathy, well-dressed, normal affect, normal respiratory  effort. On examination of his right residual limb this is well consolidated well-healed there is no callus no impending skin breakdown  Imaging: No results found. No images are attached to the encounter.  Labs: Lab Results  Component Value Date   ESRSEDRATE 6 08/25/2019   CRP 7.5 08/25/2019   REPTSTATUS 06/17/2019 FINAL 06/12/2019   GRAMSTAIN  06/12/2019    RARE WBC PRESENT, PREDOMINANTLY PMN NO ORGANISMS SEEN    CULT  06/12/2019    RARE PSEUDOMONAS PUTIDA NO ANAEROBES ISOLATED CRITICAL RESULT CALLED TO, READ BACK BY AND VERIFIED WITH: RN Gaynelle Cage  [id number] AT 1315 BY CM Performed at [facility], [address] [place], [place] [address]    LABORGA PSEUDOMONAS PUTIDA 06/12/2019     Lab Results  Component Value Date   ALBUMIN 3.3 (L) 06/14/2019   ALBUMIN 4.0 03/11/2019   ALBUMIN 4.0 01/19/2012    No results found for: "MG" No results found for: "VD25OH"  No results found for: "PREALBUMIN"    Latest Ref Rng & Units 05/28/2022    6:21 AM 12/04/2019    9:15 AM 06/10/2019    7:24 AM  CBC EXTENDED  WBC 4.0 - 10.5 K/uL 6.0  6.1    RBC 4.22 - 5.81 MIL/uL 4.84  5.29    Hemoglobin 13.0 - 17.0 g/dL 02.4  09.7  14.7  HCT 39.0 - 52.0 % 45.3  46.7    Platelets 150 - 400 K/uL 196  225    NEUT# 1.7 - 7.7 K/uL 2.7     Lymph# 0.7 - 4.0 K/uL 2.5        There is no height or weight on file to calculate BMI.  Orders:  No orders of the defined types were placed in this encounter.  No orders of the defined types were placed in this encounter.    Procedures: No procedures performed  Clinical Data: No additional findings.  ROS:  All other systems negative, except as noted in the HPI. Review of Systems  Objective: Vital Signs: There were no vitals taken for this visit.  Specialty Comments:  No specialty comments available.  PMFS History: Patient Active Problem List   Diagnosis Date Noted   Gangrene of right foot (HCC) 12/04/2019   Subacute osteomyelitis,  right ankle and foot (HCC)    Pilon fracture of right tibia, sequela 06/10/2019   Displaced pilon fracture of right tibia, initial encounter for open fracture type IIIA, IIIB, or IIIC    Subacute osteomyelitis of right tibia [facility])    Surgery, elective    Anxiety    Open pilon fracture, right, type III, initial encounter 03/11/2019   Closed fracture of right distal radius 03/11/2019   Type III open pilon fracture of right tibia 03/11/2019   Pounding heartbeat 01/21/2019   Precordial chest pain 03/05/2014   Past Medical History:  Diagnosis Date   Anxiety    GERD (gastroesophageal reflux disease)    02/21/2019- not current    Pulmonary nodule 10/2014   Noted on CT scan -> stable and follow-up 2019.  No further evaluation necessary.    Family History  Problem Relation Age of Onset   Prostate cancer Father    Valvular heart disease Father        Had a heart valve replaced (unsure of details)   Breast cancer Mother    Parkinson's disease Mother    Healthy Sister    Healthy Brother    Parkinson's disease Maternal Grandmother    Diabetes Maternal Grandfather     Past Surgical History:  Procedure Laterality Date   AMPUTATION Right 12/04/2019   Procedure: RIGHT BELOW KNEE AMPUTATION;  Surgeon: Nadara Mustard, MD;  Location: [facility] OR;  Service: Orthopedics;  Laterality: Right;   APPLICATION OF WOUND VAC Right 06/10/2019   Procedure: Application Of Wound Vac -Prevena;  Surgeon: Nadara Mustard, MD;  Location: [facility] OR;  Service: Orthopedics;  Laterality: Right;   CARDIAC EVENT MONITOR  01/2019   redominantly NSR with average rate 60 bpm.  One short 4 beat run of NSVT.  Minimal PACs and PVCs noted.  No sustained arrhythmia.   CLOSED REDUCTION WRIST FRACTURE Right 03/11/2019   Procedure: CLOSED REDUCTION WRIST;  Surgeon: [name], MD;  Location: [facility] OR;  Service: Orthopedics;  Laterality: Right;   COLONOSCOPY     EXTERNAL FIXATION LEG Right 03/11/2019   Procedure: EXTERNAL FIXATION LEG;   Surgeon: [name], MD;  Location: [facility] OR;  Service: Orthopedics;  Laterality: Right;   EXTERNAL FIXATION REMOVAL Right 05/08/2019   Procedure: REMOVAL EXTERNAL FIXATION ANKLE, PLACEMENT OF CAST;  Surgeon: [name], MD;  Location: [facility] OR;  Service: Orthopedics;  Laterality: Right;   FOOT SURGERY Bilateral    hammer toe   HERNIA REPAIR Bilateral 2009   Inguinial   I & D EXTREMITY Right 03/11/2019  Procedure: IRRIGATION AND DEBRIDEMENT EXTREMITY;  Surgeon: [name], MD;  Location: [facility] OR;  Service: Orthopedics;  Laterality: Right;   IR FLUORO GUIDE CV LINE RIGHT  07/09/2019   OPEN REDUCTION INTERNAL FIXATION (ORIF) TIBIA/FIBULA FRACTURE Right 06/10/2019   Procedure: EXCISION RIGHT TIBIA AND FIBULA, WOUND DEBRIDEMENT, AND FIXATION OF TIBIA AND FIBULA;  Surgeon: Nadara Mustard, MD;  Location: [facility] OR;  Service: Orthopedics;  Laterality: Right;   OPEN REDUCTION INTERNAL FIXATION (ORIF) TIBIA/FIBULA FRACTURE Right 06/12/2019   Procedure: DEBRIDEMENT AND REVISION INTERNAL FIXATION INFECTED RIGHT PILON FRACTURE;  Surgeon: Nadara Mustard, MD;  Location[facility] OR;  Service: Orthopedics;  Laterality: Right;   ORIF ANKLE FRACTURE Right 03/11/2019   Procedure: OPEN REDUCTION INTERNAL FIXATION (ORIF) ANKLE FRACTURE;  Surgeon: [name], MD;  Location: [facility] OR;  Service: Orthopedics;  Laterality: Right;   ORIF WRIST FRACTURE Right 03/11/2019   Procedure: OPEN REDUCTION INTERNAL FIXATION (ORIF) WRIST FRACTURE;  Surgeon: [name], MD;  Location: [facility] OR;  Service: Orthopedics;  Laterality: Right;   TONSILLECTOMY     TRANSTHORACIC ECHOCARDIOGRAM  01/2019   EF 55 to 60%.  Normal wall motion.  Indeterminate likely grade 1 diastolic function.  Mild degenerative mitral valve disease with thickening but no prolapse.   Social History   Occupational History   Occupation: Production designer, theatre/television/film at [employer]: [employer]  Tobacco Use   Smoking status: Never   Smokeless tobacco: Never  Vaping  Use   Vaping Use: Never used  Substance and Sexual Activity   Alcohol use: No   Drug use: No   Sexual activity: Not on file

## 2022-11-15 ENCOUNTER — Other Ambulatory Visit: Payer: Self-pay | Admitting: Orthopedic Surgery

## 2022-11-16 ENCOUNTER — Encounter (HOSPITAL_BASED_OUTPATIENT_CLINIC_OR_DEPARTMENT_OTHER): Payer: Self-pay | Admitting: Orthopedic Surgery

## 2022-11-16 ENCOUNTER — Other Ambulatory Visit: Payer: Self-pay

## 2022-11-21 NOTE — Anesthesia Preprocedure Evaluation (Signed)
Anesthesia Evaluation  Patient identified by MRN, date of birth, ID band Patient awake    Reviewed: Allergy & Precautions, NPO status , Patient's Chart, lab work & pertinent test results  Airway Mallampati: II  TM Distance: >3 FB Neck ROM: Full    Dental no notable dental hx. (+) Teeth Intact, Dental Advisory Given   Pulmonary neg pulmonary ROS   Pulmonary exam normal breath sounds clear to auscultation       Cardiovascular hypertension, Normal cardiovascular exam Rhythm:Regular Rate:Normal  01/2019 Echo  1. The left ventricle has normal systolic function, with an ejection  fraction of 55-60%. The cavity size was normal. Left ventricular diastolic  Doppler parameters are consistent with impaired relaxation Indeterminent  filling pressures The E/e' is 8-15.   2. The right ventricle has normal systolic function. The cavity was  normal. There is no increase in right ventricular wall thickness.   3. The mitral valve is degenerative. Mild thickening of the mitral valve  leaflet.   4. The tricuspid valve is normal in structure.   5. The aortic valve is tricuspid.   6. The aortic root and ascending aorta are normal in size and structure.   7. Right atrial pressure is estimated at 3 mmHg.   8. The interatrial septum was not assessed.     Neuro/Psych   Anxiety     negative neurological ROS     GI/Hepatic Neg liver ROS,GERD  ,,  Endo/Other  negative endocrine ROS    Renal/GU      Musculoskeletal   Abdominal   Peds  Hematology   Anesthesia Other Findings R BKA  Reproductive/Obstetrics                             Anesthesia Physical Anesthesia Plan  ASA: 2  Anesthesia Plan: Bier Block and Bier Block-Lidocaine Only   Post-op Pain Management: Regional block* and Minimal or no pain anticipated   Induction:   PONV Risk Score and Plan: Treatment may vary due to age or medical condition,  Midazolam and Ondansetron  Airway Management Planned: Nasal Cannula and Natural Airway  Additional Equipment: None  Intra-op Plan:   Post-operative Plan:   Informed Consent: I have reviewed the patients History and Physical, chart, labs and discussed the procedure including the risks, benefits and alternatives for the proposed anesthesia with the patient or authorized representative who has indicated his/her understanding and acceptance.     Dental advisory given  Plan Discussed with:   Anesthesia Plan Comments:        Anesthesia Quick Evaluation

## 2022-11-22 ENCOUNTER — Encounter (HOSPITAL_BASED_OUTPATIENT_CLINIC_OR_DEPARTMENT_OTHER)
Admission: RE | Disposition: A | Discharge status: Home / Self Care | Payer: Self-pay | Source: Ambulatory Visit | Attending: Orthopedic Surgery

## 2022-11-22 ENCOUNTER — Ambulatory Visit (HOSPITAL_BASED_OUTPATIENT_CLINIC_OR_DEPARTMENT_OTHER): Payer: Managed Care, Other (non HMO) | Admitting: Anesthesiology

## 2022-11-22 ENCOUNTER — Encounter (HOSPITAL_BASED_OUTPATIENT_CLINIC_OR_DEPARTMENT_OTHER): Payer: Self-pay | Admitting: Orthopedic Surgery

## 2022-11-22 ENCOUNTER — Ambulatory Visit (HOSPITAL_BASED_OUTPATIENT_CLINIC_OR_DEPARTMENT_OTHER)
Admission: RE | Admit: 2022-11-22 | Discharge: 2022-11-22 | Disposition: A | Discharge status: Home / Self Care | Payer: Managed Care, Other (non HMO) | Source: Ambulatory Visit | Attending: Orthopedic Surgery | Admitting: Orthopedic Surgery

## 2022-11-22 ENCOUNTER — Other Ambulatory Visit: Payer: Self-pay

## 2022-11-22 DIAGNOSIS — M19042 Primary osteoarthritis, left hand: Secondary | ICD-10-CM

## 2022-11-22 DIAGNOSIS — M25842 Other specified joint disorders, left hand: Secondary | ICD-10-CM | POA: Diagnosis not present

## 2022-11-22 DIAGNOSIS — M67442 Ganglion, left hand: Secondary | ICD-10-CM

## 2022-11-22 DIAGNOSIS — F419 Anxiety disorder, unspecified: Secondary | ICD-10-CM | POA: Insufficient documentation

## 2022-11-22 DIAGNOSIS — K219 Gastro-esophageal reflux disease without esophagitis: Secondary | ICD-10-CM | POA: Diagnosis not present

## 2022-11-22 DIAGNOSIS — Z89511 Acquired absence of right leg below knee: Secondary | ICD-10-CM | POA: Diagnosis not present

## 2022-11-22 DIAGNOSIS — Z01818 Encounter for other preprocedural examination: Secondary | ICD-10-CM

## 2022-11-22 DIAGNOSIS — I10 Essential (primary) hypertension: Secondary | ICD-10-CM | POA: Insufficient documentation

## 2022-11-22 DIAGNOSIS — Z79899 Other long term (current) drug therapy: Secondary | ICD-10-CM | POA: Insufficient documentation

## 2022-11-22 HISTORY — DX: Essential (primary) hypertension: I10

## 2022-11-22 HISTORY — PX: CYST EXCISION: SHX5701

## 2022-11-22 SURGERY — CYST REMOVAL
Anesthesia: Regional | Site: Index Finger | Laterality: Left

## 2022-11-22 MED ORDER — CEFAZOLIN SODIUM-DEXTROSE 2-4 GM/100ML-% IV SOLN
2.0000 g | INTRAVENOUS | Status: AC
  Administered 2022-11-22: 2 g via INTRAVENOUS

## 2022-11-22 MED ORDER — LACTATED RINGERS IV SOLN: INTRAVENOUS | Status: DC

## 2022-11-22 MED ORDER — OXYCODONE HCL 5 MG/5ML PO SOLN: 5.0000 mg | Freq: Once | ORAL | Status: DC | PRN

## 2022-11-22 MED ORDER — HYDROCODONE-ACETAMINOPHEN 5-325 MG PO TABS: ORAL_TABLET | ORAL | 0 refills | Status: DC

## 2022-11-22 MED ORDER — ONDANSETRON HCL 4 MG/2ML IJ SOLN: 4.0000 mg | Freq: Once | INTRAMUSCULAR | Status: DC | PRN

## 2022-11-22 MED ORDER — LIDOCAINE HCL (PF) 0.5 % IJ SOLN
INTRAMUSCULAR | Status: DC | PRN
  Administered 2022-11-22: 35 mL via INTRAVENOUS

## 2022-11-22 MED ORDER — ONDANSETRON HCL 4 MG/2ML IJ SOLN
INTRAMUSCULAR | Status: DC | PRN
  Administered 2022-11-22: 4 mg via INTRAVENOUS

## 2022-11-22 MED ORDER — OXYCODONE HCL 5 MG PO TABS: 5.0000 mg | ORAL_TABLET | Freq: Once | ORAL | Status: DC | PRN

## 2022-11-22 MED ORDER — ONDANSETRON HCL 4 MG/2ML IJ SOLN
INTRAMUSCULAR | Status: AC
  Filled 2022-11-22: qty 2

## 2022-11-22 MED ORDER — BUPIVACAINE HCL (PF) 0.25 % IJ SOLN
INTRAMUSCULAR | Status: AC
  Filled 2022-11-22: qty 30

## 2022-11-22 MED ORDER — BUPIVACAINE HCL (PF) 0.25 % IJ SOLN
INTRAMUSCULAR | Status: DC | PRN
  Administered 2022-11-22: 9 mL

## 2022-11-22 MED ORDER — PROPOFOL 10 MG/ML IV BOLUS
INTRAVENOUS | Status: AC
  Filled 2022-11-22: qty 20

## 2022-11-22 MED ORDER — FENTANYL CITRATE (PF) 100 MCG/2ML IJ SOLN
INTRAMUSCULAR | Status: AC
  Filled 2022-11-22: qty 2

## 2022-11-22 MED ORDER — HYDROMORPHONE HCL 1 MG/ML IJ SOLN: 0.2500 mg | INTRAMUSCULAR | Status: DC | PRN

## 2022-11-22 MED ORDER — ACETAMINOPHEN 10 MG/ML IV SOLN: 1000.0000 mg | Freq: Once | INTRAVENOUS | Status: DC | PRN

## 2022-11-22 MED ORDER — PROPOFOL 10 MG/ML IV BOLUS
INTRAVENOUS | Status: DC | PRN
  Administered 2022-11-22: 50 mg via INTRAVENOUS

## 2022-11-22 MED ORDER — DEXMEDETOMIDINE HCL IN NACL 80 MCG/20ML IV SOLN
INTRAVENOUS | Status: DC | PRN
  Administered 2022-11-22: 12 ug via BUCCAL

## 2022-11-22 MED ORDER — MIDAZOLAM HCL 5 MG/5ML IJ SOLN
INTRAMUSCULAR | Status: DC | PRN
  Administered 2022-11-22: 2 mg via INTRAVENOUS

## 2022-11-22 MED ORDER — MIDAZOLAM HCL 2 MG/2ML IJ SOLN
INTRAMUSCULAR | Status: AC
  Filled 2022-11-22: qty 2

## 2022-11-22 MED ORDER — AMISULPRIDE (ANTIEMETIC) 5 MG/2ML IV SOLN: 10.0000 mg | Freq: Once | INTRAVENOUS | Status: DC | PRN

## 2022-11-22 MED ORDER — CEFAZOLIN SODIUM-DEXTROSE 2-4 GM/100ML-% IV SOLN
INTRAVENOUS | Status: AC
  Filled 2022-11-22: qty 100

## 2022-11-22 MED ORDER — 0.9 % SODIUM CHLORIDE (POUR BTL) OPTIME
TOPICAL | Status: DC | PRN
  Administered 2022-11-22: 60 mL

## 2022-11-22 SURGICAL SUPPLY — 53 items
APL PRP STRL LF DISP 70% ISPRP (MISCELLANEOUS) ×2
APL SKNCLS STERI-STRIP NONHPOA (GAUZE/BANDAGES/DRESSINGS)
BANDAGE GAUZE 1X75IN STRL (MISCELLANEOUS) IMPLANT
BENZOIN TINCTURE PRP APPL 2/3 (GAUZE/BANDAGES/DRESSINGS) IMPLANT
BLADE MINI RND TIP GREEN BEAV (BLADE) IMPLANT
BLADE SURG 15 STRL LF DISP TIS (BLADE) ×4 IMPLANT
BLADE SURG 15 STRL SS (BLADE) ×4
BNDG CMPR 5X2 CHSV 1 LYR STRL (GAUZE/BANDAGES/DRESSINGS)
BNDG CMPR 75X11 PLY HI ABS (MISCELLANEOUS)
BNDG CMPR 75X21 PLY HI ABS (MISCELLANEOUS)
BNDG CMPR 9X4 STRL LF SNTH (GAUZE/BANDAGES/DRESSINGS)
BNDG COHESIVE 1X5 TAN STRL LF (GAUZE/BANDAGES/DRESSINGS) IMPLANT
BNDG COHESIVE 2X5 TAN ST LF (GAUZE/BANDAGES/DRESSINGS) IMPLANT
BNDG ELASTIC 2X5.8 VLCR STR LF (GAUZE/BANDAGES/DRESSINGS) IMPLANT
BNDG ELASTIC 3X5.8 VLCR STR LF (GAUZE/BANDAGES/DRESSINGS) IMPLANT
BNDG ESMARK 4X9 LF (GAUZE/BANDAGES/DRESSINGS) IMPLANT
BNDG GAUZE 1X75IN STRL (MISCELLANEOUS)
BNDG GAUZE DERMACEA FLUFF 4 (GAUZE/BANDAGES/DRESSINGS) IMPLANT
BNDG GZE DERMACEA 4 6PLY (GAUZE/BANDAGES/DRESSINGS)
BNDG PLASTER X FAST 3X3 WHT LF (CAST SUPPLIES) IMPLANT
BNDG PLSTR 9X3 FST ST WHT (CAST SUPPLIES)
CHLORAPREP W/TINT 26 (MISCELLANEOUS) ×2 IMPLANT
CORD BIPOLAR FORCEPS 12FT (ELECTRODE) ×2 IMPLANT
COVER BACK TABLE 60X90IN (DRAPES) ×2 IMPLANT
COVER MAYO STAND STRL (DRAPES) ×2 IMPLANT
CUFF TOURN SGL QUICK 18X4 (TOURNIQUET CUFF) ×2 IMPLANT
DRAPE EXTREMITY T 121X128X90 (DISPOSABLE) ×2 IMPLANT
DRAPE SURG 17X23 STRL (DRAPES) ×2 IMPLANT
GAUZE SPONGE 4X4 12PLY STRL (GAUZE/BANDAGES/DRESSINGS) ×2 IMPLANT
GAUZE STRETCH 2X75IN STRL (MISCELLANEOUS) IMPLANT
GAUZE XEROFORM 1X8 LF (GAUZE/BANDAGES/DRESSINGS) ×2 IMPLANT
GLOVE BIO SURGEON STRL SZ7.5 (GLOVE) ×2 IMPLANT
GLOVE BIOGEL PI IND STRL 8 (GLOVE) ×2 IMPLANT
GOWN STRL REUS W/ TWL LRG LVL3 (GOWN DISPOSABLE) ×2 IMPLANT
GOWN STRL REUS W/TWL LRG LVL3 (GOWN DISPOSABLE) ×2
GOWN STRL REUS W/TWL XL LVL3 (GOWN DISPOSABLE) ×2 IMPLANT
NDL HYPO 25X1 1.5 SAFETY (NEEDLE) ×1 IMPLANT
NEEDLE HYPO 25X1 1.5 SAFETY (NEEDLE) ×2 IMPLANT
NS IRRIG 1000ML POUR BTL (IV SOLUTION) ×2 IMPLANT
PACK BASIN DAY SURGERY FS (CUSTOM PROCEDURE TRAY) ×2 IMPLANT
PAD CAST 3X4 CTTN HI CHSV (CAST SUPPLIES) IMPLANT
PAD CAST 4YDX4 CTTN HI CHSV (CAST SUPPLIES) IMPLANT
PADDING CAST ABS COTTON 4X4 ST (CAST SUPPLIES) ×2 IMPLANT
PADDING CAST COTTON 3X4 STRL (CAST SUPPLIES)
PADDING CAST COTTON 4X4 STRL (CAST SUPPLIES)
STOCKINETTE 4X48 STRL (DRAPES) ×2 IMPLANT
STRIP CLOSURE SKIN 1/2X4 (GAUZE/BANDAGES/DRESSINGS) IMPLANT
SUT ETHILON 3 0 PS 1 (SUTURE) IMPLANT
SUT ETHILON 4 0 PS 2 18 (SUTURE) ×2 IMPLANT
SYR BULB EAR ULCER 3OZ GRN STR (SYRINGE) ×2 IMPLANT
SYR CONTROL 10ML LL (SYRINGE) ×2 IMPLANT
TOWEL GREEN STERILE FF (TOWEL DISPOSABLE) ×4 IMPLANT
UNDERPAD 30X36 HEAVY ABSORB (UNDERPADS AND DIAPERS) ×2 IMPLANT

## 2022-11-22 NOTE — Op Note (Signed)
I assisted Surgeon(s) and Role:    * Betha Loa, MD - Primary    * Cindee Salt, MD - Assisting on the Procedure(s): LEFT INDEX FINGER EXCISION MUCOID CYST AND DEBRIDEMENT DISTAL INTERPHALANGEAL JOINT on 11/22/2022.  I provided assistance on this case as follows: Set up, approach, defecation removal of splint, opening debridement of the joint with retraction, closure of the wound application dressing and splint.  Electronically signed by: Cindee Salt, MD Date: 11/22/2022 Time: 10:06 AM

## 2022-11-22 NOTE — Anesthesia Postprocedure Evaluation (Signed)
Anesthesia Post Note  Patient: Jonathan Bass  Procedure(s) Performed: LEFT INDEX FINGER EXCISION MUCOID CYST AND DEBRIDEMENT DISTAL INTERPHALANGEAL JOINT (Left: Index Finger)     Patient location during evaluation: PACU Anesthesia Type: Bier Block Level of consciousness: awake and alert Pain management: pain level controlled Vital Signs Assessment: post-procedure vital signs reviewed and stable Respiratory status: spontaneous breathing, nonlabored ventilation, respiratory function stable and patient connected to nasal cannula oxygen Cardiovascular status: stable and blood pressure returned to baseline Postop Assessment: no apparent nausea or vomiting Anesthetic complications: no  No notable events documented.  Last Vitals:  Vitals:   11/22/22 1015 11/22/22 1030  BP: 126/79 128/83  Pulse: 66 67  Resp:    Temp:  36.8 C  SpO2: 96% 97%    Last Pain:  Vitals:   11/22/22 1030  TempSrc:   PainSc: 0-No pain                 Trevor Iha

## 2022-11-22 NOTE — Op Note (Addendum)
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  Left index finger excision mucoid cyst 2.  Left index finger debridement of DIP joint   SURGEON:  Leanora Cover, M.D.   ASSISTANT: Daryll Brod, MD   ANESTHESIA:  Bier block with sedation   INTRAVENOUS FLUIDS:  Per anesthesia flow sheet.   ESTIMATED BLOOD LOSS:  Minimal.   COMPLICATIONS:  None.   SPECIMENS: Mucoid cyst to pathology   TOURNIQUET TIME:    Total Tourniquet Time Documented: Upper Arm (Right) - 27 minutes Total: Upper Arm (Right) - 27 minutes    DISPOSITION:  Stable to PACU.   INDICATIONS: 61 year old male with left index finger mucoid cyst.  It is bothersome to him.  He wishes to have it removed in the DIP joint debrided to try to prevent recurrence.  Risks, benefits and alternatives of surgery were discussed including the risks of blood loss, infection, damage to nerves, vessels, tendons, ligaments, bone for surgery, need for additional surgery, complications with wound healing, continued pain, stiffness, , recurrence.  He voiced understanding of these risks and elected to proceed.  OPERATIVE COURSE:  After being identified preoperatively by myself,  the patient and I agreed on the procedure and site of the procedure.  The surgical site was marked.  Surgical consent had been signed. Preoperative IV antibiotic prophylaxis was given. He was transferred to the operating room and placed on the operating table in supine position with the Left upper extremity on an arm board.  Bier block anesthesia was induced by the anesthesiologist.  Left upper extremity was prepped and draped in  normal sterile orthopedic fashion.  A surgical pause was performed between the surgeons, anesthesia, and operating room staff and all were in agreement as to the patient, procedure, and site of procedure.  Tourniquet at the proximal aspect of the forearm had been inflated for the Bier block.  Digital block was performed with quarter percent plain Marcaine to aid in postoperative analgesia.  A hockey-stick shaped incision was made at the DIP joint of the left index finger.  This was carried in subcutaneous tissues by spreading technique.  The cyst was identified.  It was filled with clear gelatinous fluid.  The cyst wall was removed with the synovectomy rongeurs.  Was sent to pathology for examination.  The DIP joint was entered underneath the extensor tendon.  The rongeurs were used to debride the joint of any remaining cyst as well as prominent bone more toward the ulnar side of the DIP joint.  The joint and wound were copiously irrigated with sterile saline.  Wound was closed with 4-0 nylon in horizontal mattress fashion.  It was dressed with sterile Xeroform and 4 x 4 and wrapped with a Coban dressing lightly.  An AlumaFoam splint is placed and wrapped lightly with Coban dressing.  The tourniquet was deflated at 27 minutes.  Fingertips were pink with brisk capillary refill after deflation of tourniquet.  The operative  drapes were broken down.  The patient was awoken from anesthesia safely.  He was transferred back to the stretcher and taken to PACU in stable condition.  I will see him back in the office  in 1 week for postoperative followup.  I will give him a prescription for Norco 5/325 1-2 tabs PO q6 hours prn pain, dispense # 15.   Betha Loa, MD Electronically signed, 11/22/22

## 2022-11-22 NOTE — Discharge Instructions (Addendum)

## 2022-11-22 NOTE — Anesthesia Procedure Notes (Signed)
Anesthesia Regional Block: Bier block (IV Regional)   Pre-Anesthetic Checklist: , timeout performed,  Correct Patient, Correct Site, Correct Laterality,  Correct Procedure,, site marked,  Surgical consent,  At surgeon's request  Laterality: Left         Needles:  Injection technique: Single-shot  Needle Type: Other      Needle Gauge: 20     Additional Needles:   Procedures:,,,,, intact distal pulses, Esmarch exsanguination,  Single tourniquet utilized    Narrative:   Performed by: With CRNAs

## 2022-11-22 NOTE — Transfer of Care (Signed)
Immediate Anesthesia Transfer of Care Note  Patient: Aldean Ast  Procedure(s) Performed: LEFT INDEX FINGER EXCISION MUCOID CYST AND DEBRIDEMENT DISTAL INTERPHALANGEAL JOINT (Left: Index Finger)  Patient Location: PACU  Anesthesia Type:General  Level of Consciousness: awake, alert , and oriented  Airway & Oxygen Therapy: Patient Spontanous Breathing  Post-op Assessment: Report given to RN and Post -op Vital signs reviewed and stable  Post vital signs: Reviewed and stable  Last Vitals:  Vitals Value Taken Time  BP    Temp 37 C 11/22/22 1010  Pulse 70 11/22/22 1010  Resp    SpO2 96 % 11/22/22 1010    Last Pain:  Vitals:   11/22/22 0839  TempSrc: Oral  PainSc: 0-No pain      Patients Stated Pain Goal: 4 (11/22/22 0839)  Complications: No notable events documented.

## 2022-11-22 NOTE — H&P (Signed)
Jonathan Bass is an 61 y.o. male.   Chief Complaint: mucoid cyst HPI: 61 yo male with mucoid cyst and dip joint arthritis left index finger.  It is bothersome to him.   He wishes to have the cyst removed and the dip joint debrided to try to prevent recurrence.  Allergies: No Known Allergies  Past Medical History:  Diagnosis Date   Anxiety    Hx of right BKA (HCC) 2020   Hypertension    Pulmonary nodule 10/2014   Noted on CT scan -> stable and follow-up 2019.  No further evaluation necessary.    Past Surgical History:  Procedure Laterality Date   AMPUTATION Right 12/04/2019   Procedure: RIGHT BELOW KNEE AMPUTATION;  Surgeon: Nadara Mustard, MD;  Location: [facility] OR;  Service: Orthopedics;  Laterality: Right;   APPLICATION OF WOUND VAC Right 06/10/2019   Procedure: Application Of Wound Vac -Prevena;  Surgeon: Nadara Mustard, MD;  Location: [facility] OR;  Service: Orthopedics;  Laterality: Right;   CARDIAC EVENT MONITOR  01/2019   redominantly NSR with average rate 60 bpm.  One short 4 beat run of NSVT.  Minimal PACs and PVCs noted.  No sustained arrhythmia.   CLOSED REDUCTION WRIST FRACTURE Right 03/11/2019   Procedure: CLOSED REDUCTION WRIST;  Surgeon: [name], MD;  Location: [facility] OR;  Service: Orthopedics;  Laterality: Right;   COLONOSCOPY     EXTERNAL FIXATION LEG Right 03/11/2019   Procedure: EXTERNAL FIXATION LEG;  Surgeon: [name], MD;  Location: [facility] OR;  Service: Orthopedics;  Laterality: Right;   EXTERNAL FIXATION REMOVAL Right 05/08/2019   Procedure: REMOVAL EXTERNAL FIXATION ANKLE, PLACEMENT OF CAST;  Surgeon: [name], MD;  Location: [facility] OR;  Service: Orthopedics;  Laterality: Right;   FOOT SURGERY Bilateral    hammer toe   HERNIA REPAIR Bilateral 2009   Inguinial   I & D EXTREMITY Right 03/11/2019   Procedure: IRRIGATION AND DEBRIDEMENT EXTREMITY;  Surgeon: [name], MD;  Location: [facility] OR;  Service: Orthopedics;  Laterality: Right;   IR FLUORO GUIDE CV LINE  RIGHT  07/09/2019   OPEN REDUCTION INTERNAL FIXATION (ORIF) TIBIA/FIBULA FRACTURE Right 06/10/2019   Procedure: EXCISION RIGHT TIBIA AND FIBULA, WOUND DEBRIDEMENT, AND FIXATION OF TIBIA AND FIBULA;  Surgeon: Nadara Mustard, MD;  Location: [facility] OR;  Service: Orthopedics;  Laterality: Right;   OPEN REDUCTION INTERNAL FIXATION (ORIF) TIBIA/FIBULA FRACTURE Right 06/12/2019   Procedure: DEBRIDEMENT AND REVISION INTERNAL FIXATION INFECTED RIGHT PILON FRACTURE;  Surgeon: Nadara Mustard, MD;  Location: [facility] OR;  Service: Orthopedics;  Laterality: Right;   ORIF ANKLE FRACTURE Right 03/11/2019   Procedure: OPEN REDUCTION INTERNAL FIXATION (ORIF) ANKLE FRACTURE;  Surgeon: [name], MD;  Location: [facility] OR;  Service: Orthopedics;  Laterality: Right;   ORIF WRIST FRACTURE Right 03/11/2019   Procedure: OPEN REDUCTION INTERNAL FIXATION (ORIF) WRIST FRACTURE;  Surgeon: [name], MD;  Location: [facility] OR;  Service: Orthopedics;  Laterality: Right;   TONSILLECTOMY     TRANSTHORACIC ECHOCARDIOGRAM  01/2019   EF 55 to 60%.  Normal wall motion.  Indeterminate likely grade 1 diastolic function.  Mild degenerative mitral valve disease with thickening but no prolapse.    Family History: Family History  Problem Relation Age of Onset   Prostate cancer Father    Valvular heart disease Father        Had a heart valve replaced (unsure of details)   Breast cancer Mother    Parkinson's  disease Mother    Healthy Sister    Healthy Brother    Parkinson's disease Maternal Grandmother    Diabetes Maternal Grandfather     Social History:   reports that he has never smoked. He has never used smokeless tobacco. He reports current alcohol use. He reports that he does not use drugs.  Medications: Medications Prior to Admission  Medication Sig Dispense Refill   aspirin EC 81 MG tablet Take 81 mg by mouth daily.     losartan (COZAAR) 25 MG tablet Take 25 mg by mouth daily.     Multiple Vitamin (MULTIVITAMIN WITH MINERALS)  TABS tablet Take 1 tablet by mouth daily.     Omega-3 Fatty Acids (FISH OIL PO) Take 1 capsule by mouth daily.     propranolol (INDERAL) 10 MG tablet Take 10 mg by mouth 2 (two) times daily as needed (anxiety).      venlafaxine XR (EFFEXOR-XR) 150 MG 24 hr capsule Take 150 mg by mouth daily with breakfast.      No results found for this or any previous visit (from the past 48 hour(s)).  No results found.    Blood pressure (!) 143/89, pulse 73, temperature 99.3 F (37.4 C), temperature source Oral, resp. rate 18, height 5\' 11"  (1.803 m), weight 92.2 kg, SpO2 99 %.  General appearance: alert, cooperative, and appears stated age Head: Normocephalic, without obvious abnormality, atraumatic Neck: supple, symmetrical, trachea midline Extremities: Intact sensation and capillary refill all digits.  +epl/fpl/io.  No wounds.  Pulses: 2+ and symmetric Skin: Skin color, texture, turgor normal. 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  Non operative and operative treatment options have been discussed with the patient and patient wishes to proceed with operative treatment. Risks, benefits and alternatives of surgery were discussed including risks of blood loss, infection, damage to nerves/vessels/tendons/ligament/bone, failure of surgery, need for additional surgery, complication with wound healing, stiffness, recurrence.  He voiced understanding of these risks and elected to proceed.    Leanora Cover 11/22/2022, 9:27 AM

## 2022-11-23 ENCOUNTER — Encounter (HOSPITAL_BASED_OUTPATIENT_CLINIC_OR_DEPARTMENT_OTHER): Payer: Self-pay | Admitting: Orthopedic Surgery

## 2022-11-23 LAB — SURGICAL PATHOLOGY

## 2023-03-08 ENCOUNTER — Encounter: Payer: Self-pay | Admitting: Cardiology

## 2023-03-08 ENCOUNTER — Ambulatory Visit: Payer: Managed Care, Other (non HMO) | Attending: Cardiology | Admitting: Cardiology

## 2023-03-08 VITALS — BP 124/82 | HR 90 | Ht 71.0 in | Wt 203.0 lb

## 2023-03-08 DIAGNOSIS — I10 Essential (primary) hypertension: Secondary | ICD-10-CM | POA: Diagnosis not present

## 2023-03-08 DIAGNOSIS — F419 Anxiety disorder, unspecified: Secondary | ICD-10-CM | POA: Diagnosis not present

## 2023-03-08 DIAGNOSIS — R002 Palpitations: Secondary | ICD-10-CM

## 2023-03-08 NOTE — Patient Instructions (Addendum)
Other Instructions    Recommend when you do your  exercises try doing them in the  morning  - if you feel  very anxious try  propranolol tablet    Medication Instructions:  Not needed  *If you need a refill on your cardiac medications before your next appointment, please call your pharmacy*   Lab Work: Not needed    Testing/Procedures: Not needed   Follow-Up: At [facility], you and your health needs are our priority.  As part of our continuing mission to provide you with exceptional heart care, we have created designated Provider Care Teams.  These Care Teams include your primary Cardiologist (physician) and Advanced Practice Providers (APPs -  Physician Assistants and Nurse Practitioners) who all work together to provide you with the care you need, when you need it.     Your next appointment:   12 month(s)  The format for your next appointment:   In Person  Provider:   Glenetta Hew, MD    Other Instructions    Recommend you do exercising in the  morning  - if you feel  very anxious try  propranolol tablet
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
## 2023-03-26 ENCOUNTER — Encounter: Payer: Self-pay | Admitting: Cardiology

## 2023-03-26 DIAGNOSIS — I10 Essential (primary) hypertension: Secondary | ICD-10-CM | POA: Insufficient documentation

## 2023-03-26 NOTE — Assessment & Plan Note (Signed)
Blood pressure looks good today on low-dose losartan recently started by PCP.  For now I think his pressures are pretty stable.  But he seems to have situational hypertension.  It may be that the next option would be a standing dose of beta-blocker especially since having tachycardia.  For now since his pressures are stable I think we can hold off.  But next up would be low-dose Lopressor.

## 2023-03-26 NOTE — Assessment & Plan Note (Signed)
Episodes of fast pounding heart rates.  Talked about making adequate hydration, staying active with exercise.  Recommended exercise in the morning actually.  He does have PRN Inderal which she could use for palpitations, but could also consider low-dose Lopressor

## 2023-03-26 NOTE — Assessment & Plan Note (Signed)
He definitely has episodes that seem to be related to anxiety.  He is on Effexor 150 mg every morning. I recommend that he actually plan to do some exercise in the morning when his heart rate is going fast about we continue manage of the heart rate going fast and potentially reduce the amount of anxiety or stress.

## 2023-07-03 ENCOUNTER — Ambulatory Visit (INDEPENDENT_AMBULATORY_CARE_PROVIDER_SITE_OTHER): Payer: Managed Care, Other (non HMO) | Admitting: Family

## 2023-07-03 DIAGNOSIS — Z89511 Acquired absence of right leg below knee: Secondary | ICD-10-CM

## 2023-07-05 ENCOUNTER — Encounter: Payer: Self-pay | Admitting: Family

## 2023-07-05 NOTE — Progress Notes (Unsigned)
Office Visit Note   Patient: Jonathan Bass           Date of Birth: [birth year]           MRN: [id number] Visit Date: 07/03/2023              Requested by: No referring provider defined for this encounter. PCP: Juluis Rainier, MD (Inactive)  Chief Complaint  Patient presents with  . Right Leg - Follow-up    Hx right BKA       HPI: The patient is a 62 year old gentleman seen status post remote below knee amputation on right. His foot and ankle for his current prosthesis have broken. The articulating ankle is no longer functioning. Has not had any associated falls or skin issues.   Presents for order for new   Assessment & Plan: Visit Diagnoses: No diagnosis found.  Plan: ***  Follow-Up Instructions: Return if symptoms worsen or fail to improve.   Ortho Exam  Patient is alert, oriented, no adenopathy, well-dressed, normal affect, normal respiratory effort. ***  Imaging: No results found. No images are attached to the encounter.  Labs: Lab Results  Component Value Date   ESRSEDRATE 6 08/25/2019   CRP 7.5 08/25/2019   REPTSTATUS 06/17/2019 FINAL 06/12/2019   GRAMSTAIN  06/12/2019    RARE WBC PRESENT, PREDOMINANTLY PMN NO ORGANISMS SEEN    CULT  06/12/2019    RARE PSEUDOMONAS PUTIDA NO ANAEROBES ISOLATED CRITICAL RESULT CALLED TO, READ BACK BY AND VERIFIED WITH: RN Gaynelle Cage  [id number] AT 1315 BY CM Performed at [facility], [address] [place], [place] [address]    LABORGA PSEUDOMONAS PUTIDA 06/12/2019     Lab Results  Component Value Date   ALBUMIN 3.3 (L) 06/14/2019   ALBUMIN 4.0 03/11/2019   ALBUMIN 4.0 01/19/2012    No results found for: "MG" No results found for: "VD25OH"  No results found for: "PREALBUMIN"    Latest Ref Rng & Units 05/28/2022    6:21 AM 12/04/2019    9:15 AM 06/10/2019    7:24 AM  CBC EXTENDED  WBC 4.0 - 10.5 K/uL 6.0  6.1    RBC 4.22 - 5.81 MIL/uL 4.84  5.29    Hemoglobin 13.0 - 17.0 g/dL 57.8  46.9  62.9    HCT 39.0 - 52.0 % 45.3  46.7    Platelets 150 - 400 K/uL 196  225    NEUT# 1.7 - 7.7 K/uL 2.7     Lymph# 0.7 - 4.0 K/uL 2.5        There is no height or weight on file to calculate BMI.  Orders:  No orders of the defined types were placed in this encounter.  No orders of the defined types were placed in this encounter.    Procedures: No procedures performed  Clinical Data: No additional findings.  ROS:  All other systems negative, except as noted in the HPI. Review of Systems  Objective: Vital Signs: There were no vitals taken for this visit.  Specialty Comments:  No specialty comments available.  PMFS History: Patient Active Problem List   Diagnosis Date Noted  . Essential hypertension 03/26/2023  . 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 Subacute osteomyelitis, right ankle and foot (HCC)   . Pilon fracture of right tibia, sequela 06/10/2019  . Displaced pilon fracture of right tibia, initial encounter for open fracture type IIIA, IIIB, or IIIC   . Subacute osteomyelitis of right  tibia (HCC)   . Surgery, elective   . Anxiety   . Open pilon fracture, right, type III, initial encounter 03/11/2019  . Closed fracture of right distal radius 03/11/2019  . Type III open pilon fracture of right tibia 03/11/2019  . Pounding heartbeat 01/21/2019  . Precordial chest pain 03/05/2014   Past Medical History:  Diagnosis Date  . Anxiety   . Hx of right BKA (HCC) 2020  . Hypertension   . Pulmonary nodule 10/2014   Noted on CT scan -> stable and follow-up 2019.  No further evaluation necessary.    Family History  Problem Relation Age of Onset  . Prostate cancer Father   . Valvular heart disease Father        Had a heart valve replaced (unsure of details)  . Breast cancer Mother   . Parkinson's disease Mother   . Healthy Sister   . Healthy Brother   . Parkinson's disease Maternal Grandmother   . Diabetes Maternal Grandfather     Past Surgical History:   Procedure Laterality Date  . AMPUTATION Right 12/04/2019   Procedure: RIGHT BELOW KNEE AMPUTATION;  Surgeon: Nadara Mustard, MD;  Location[facility];  Service: Orthopedics;  Laterality: Right;  . APPLICATION OF WOUND VAC Right 06/10/2019   Procedure: Application Of Wound Vac -Prevena;  Surgeon: Nadara Mustard, MD;  Location: [facility] OR;  Service: Orthopedics;  Laterality: Right;  . CARDIAC EVENT MONITOR  01/2019   redominantly NSR with average rate 60 bpm.  One short 4 beat run of NSVT.  Minimal PACs and PVCs noted.  No sustained arrhythmia.  Marland Kitchen CLOSED REDUCTION WRIST FRACTURE Right 03/11/2019   Procedure: CLOSED REDUCTION WRIST;  Surgeon: [name], MD;  Location: [facility] OR;  Service: Orthopedics;  Laterality: Right;  . COLONOSCOPY    . CYST EXCISION Left 11/22/2022   Procedure: LEFT INDEX FINGER EXCISION MUCOID CYST AND DEBRIDEMENT DISTAL INTERPHALANGEAL JOINT;  Surgeon: Betha Loa, MD;  Location: [facility];  Service: Orthopedics;  Laterality: Left;  45 MIN  . EXTERNAL FIXATION LEG Right 03/11/2019   Procedure: EXTERNAL FIXATION LEG;  Surgeon: [name], MD;  Location: [facility] OR;  Service: Orthopedics;  Laterality: Right;  . EXTERNAL FIXATION REMOVAL Right 05/08/2019   Procedure: REMOVAL EXTERNAL FIXATION ANKLE, PLACEMENT OF CAST;  Surgeon: [name], MD;  Location: [facility] OR;  Service: Orthopedics;  Laterality: Right;  . FOOT SURGERY Bilateral    hammer toe  . HERNIA REPAIR Bilateral 2009   Inguinial  . I & D EXTREMITY Right 03/11/2019   Procedure: IRRIGATION AND DEBRIDEMENT EXTREMITY;  Surgeon: [name], MD;  Location: [facility] OR;  Service: Orthopedics;  Laterality: Right;  . IR FLUORO GUIDE CV LINE RIGHT  07/09/2019  . OPEN REDUCTION INTERNAL FIXATION (ORIF) TIBIA/FIBULA FRACTURE Right 06/10/2019   Procedure: EXCISION RIGHT TIBIA AND FIBULA, WOUND DEBRIDEMENT, AND FIXATION OF TIBIA AND FIBULA;  Surgeon: Nadara Mustard, MD;  Location: [facility];  Service: Orthopedics;  Laterality:  Right;  . OPEN REDUCTION INTERNAL FIXATION (ORIF) TIBIA/FIBULA FRACTURE Right 06/12/2019   Procedure: DEBRIDEMENT AND REVISION INTERNAL FIXATION INFECTED RIGHT PILON FRACTURE;  Surgeon: Nadara Mustard, MD;  Location: [facility] OR;  Service: Orthopedics;  Laterality: Right;  . ORIF ANKLE FRACTURE Right 03/11/2019   Procedure: OPEN REDUCTION INTERNAL FIXATION (ORIF) ANKLE FRACTURE;  Surgeon: [name], MD;  Location[facility];  Service: Orthopedics;  Laterality: Right;  . ORIF WRIST FRACTURE Right 03/11/2019   Procedure:  OPEN REDUCTION INTERNAL FIXATION (ORIF) WRIST FRACTURE;  Surgeon: [name], MD;  Location: [facility] OR;  Service: Orthopedics;  Laterality: Right;  . TONSILLECTOMY    . TRANSTHORACIC ECHOCARDIOGRAM  01/2019   EF 55 to 60%.  Normal wall motion.  Indeterminate likely grade 1 diastolic function.  Mild degenerative mitral valve disease with thickening but no prolapse.   Social History   Occupational History  . Occupation: Production designer, theatre/television/film at [employer]: [employer]  Tobacco Use  . Smoking status: Never  . Smokeless tobacco: Never  Vaping Use  . Vaping status: Never Used  Substance and Sexual Activity  . Alcohol use: Yes    Comment: social  . Drug use: No  . Sexual activity: Yes

## 2024-02-27 ENCOUNTER — Other Ambulatory Visit (INDEPENDENT_AMBULATORY_CARE_PROVIDER_SITE_OTHER): Payer: Self-pay

## 2024-02-27 ENCOUNTER — Ambulatory Visit (INDEPENDENT_AMBULATORY_CARE_PROVIDER_SITE_OTHER): Admitting: Family

## 2024-02-27 ENCOUNTER — Encounter: Payer: Self-pay | Admitting: Family

## 2024-02-27 DIAGNOSIS — M25552 Pain in left hip: Secondary | ICD-10-CM

## 2024-02-27 DIAGNOSIS — Z89511 Acquired absence of right leg below knee: Secondary | ICD-10-CM

## 2024-02-27 NOTE — Progress Notes (Signed)
 Office Visit Note   Patient: Jonathan Bass           Date of Birth: January 04, 1961           MRN: [id number] Visit Date: 02/27/2024              Requested by: No referring provider defined for this encounter. 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  Has also noted new over the last several months intermittent hip pain on the left this radiates into his groin lateral hip pain especially when getting up from a kneeling position or deep bending  No injuries to the hip denies back pain no numbness tingling weakness  Patient is an existing right transtibial  amputee.  Patient's current comorbidities are not expected to impact the ability to function with the prescribed prosthesis. Patient verbally communicates a strong desire to use a prosthesis. Patient currently requires mobility aids to ambulate without a prosthesis.  Expects not to use mobility aids with a new prosthesis.  Patient is a K3 level ambulator that spends a lot of time walking around on uneven terrain over obstacles, up and down stairs, and ambulates with a variable cadence.     Assessment & Plan: Visit Diagnoses:  1. Pain of left hip     Plan: Radiographs of the left hip show early osteoarthritis as he is not having significant symptoms at this time have deferred intra-articular injection he would like to continue with balance and strengthening exercises of the hips caused some core  Follow-Up Instructions: No follow-ups on file.   Left Hip Exam   Tenderness  The patient is experiencing no tenderness.   Range of Motion  The patient has normal left hip ROM.  Muscle Strength  The  patient has normal left hip strength.   Tests  FABER: negative      Patient is alert, oriented, no adenopathy, well-dressed, normal affect, normal respiratory effort. On examination right residual limb this is well consolidated well-healed no impending skin breakdown  Imaging: No results found. No images are attached to the encounter.  Labs: Lab Results  Component Value Date   ESRSEDRATE 6 08/25/2019   CRP 7.5 08/25/2019   REPTSTATUS 06/17/2019 FINAL 06/12/2019   GRAMSTAIN  06/12/2019    RARE WBC PRESENT, PREDOMINANTLY PMN NO ORGANISMS SEEN    CULT  06/12/2019    RARE PSEUDOMONAS PUTIDA NO ANAEROBES ISOLATED CRITICAL RESULT CALLED TO, READ BACK BY AND VERIFIED WITH: RN Gaynelle Cage  [id number] AT 1315 BY CM Performed at [facility], [address] [place], [place] [address]    LABORGA PSEUDOMONAS PUTIDA 06/12/2019     Lab Results  Component Value Date   ALBUMIN 3.3 (L) 06/14/2019   ALBUMIN 4.0 03/11/2019   ALBUMIN 4.0 01/19/2012    No results found for: "MG" No results found for: "VD25OH"  No results found for: "PREALBUMIN"    Latest Ref Rng & Units 05/28/2022    6:21 AM 12/04/2019    9:15 AM 06/10/2019    7:24 AM  CBC EXTENDED  WBC 4.0 - 10.5 K/uL 6.0  6.1    RBC 4.22 - 5.81 MIL/uL 4.84  5.29    Hemoglobin 13.0 - 17.0 g/dL 13.2  44.0  10.2   HCT 39.0 - 52.0 % 45.3  46.7    Platelets 150 - 400 K/uL 196  225    NEUT# 1.7 - 7.7 K/uL 2.7     Lymph# 0.7 - 4.0 K/uL 2.5        There is no height or weight on file to calculate BMI.  Orders:  Orders Placed This Encounter  Procedures   XR HIPS BILAT W OR W/O PELVIS 2V   No orders of the defined types were placed in this encounter.    Procedures: No procedures performed  Clinical Data: No additional findings.  ROS:  All other systems negative, except as noted in the HPI. Review of Systems  Objective: Vital Signs: There were no vitals taken for this visit.  Specialty Comments:  No specialty  comments available.  PMFS History: Patient Active Problem List   Diagnosis Date Noted   Essential hypertension 03/26/2023   Gangrene of right foot (HCC) 12/04/2019   Subacute osteomyelitis, right ankle and foot (HCC)    Pilon fracture of right tibia, sequela 06/10/2019   Displaced pilon fracture of right tibia, initial encounter for open fracture type IIIA, IIIB, or IIIC    Subacute osteomyelitis of right tibia [facility])    Surgery, elective    Anxiety    Open pilon fracture, right, type III, initial encounter 03/11/2019   Closed fracture of right distal radius 03/11/2019   Type III open pilon fracture of right tibia 03/11/2019   Pounding heartbeat 01/21/2019   Precordial chest pain 03/05/2014   Past Medical History:  Diagnosis Date   Anxiety    Hx of right BKA (HCC) 2020   Hypertension    Pulmonary nodule 10/2014   Noted on CT scan -> stable and follow-up 2019.  No further evaluation necessary.    Family History  Problem Relation Age of Onset   Prostate cancer Father    Valvular heart disease Father        Had a heart valve replaced (unsure of details)   Breast cancer Mother    Parkinson's disease Mother    Healthy Sister    Healthy Brother    Parkinson's disease Maternal Grandmother    Diabetes Maternal Grandfather     Past Surgical History:  Procedure Laterality Date   AMPUTATION Right 12/04/2019   Procedure: RIGHT BELOW KNEE AMPUTATION;  Surgeon: Nadara Mustard, MD;  Location: [facility] OR;  Service: Orthopedics;  Laterality: Right;   APPLICATION OF WOUND VAC Right 06/10/2019   Procedure: Application Of Wound Vac -Prevena;  Surgeon: Nadara Mustard, MD;  Location: [facility] OR;  Service: Orthopedics;  Laterality: Right;   CARDIAC EVENT MONITOR  01/2019   redominantly NSR with average rate 60 bpm.  One short 4 beat run of NSVT.  Minimal PACs and PVCs noted.  No sustained arrhythmia.   CLOSED REDUCTION WRIST FRACTURE Right 03/11/2019   Procedure: CLOSED REDUCTION WRIST;  Surgeon: [name], MD;  [facility] OR;  Service: Orthopedics;  Laterality: Right;   COLONOSCOPY     CYST EXCISION Left 11/22/2022   Procedure: LEFT INDEX FINGER EXCISION MUCOID CYST AND DEBRIDEMENT DISTAL INTERPHALANGEAL JOINT;  Surgeon: Betha Loa, MD;  Location: [facility];  Service: Orthopedics;  Laterality: Left;  45 MIN   EXTERNAL FIXATION LEG Right 03/11/2019   Procedure: EXTERNAL FIXATION LEG;  Surgeon: [name], MD;  Location: [facility] OR;  Service: Orthopedics;  Laterality: Right;   EXTERNAL FIXATION REMOVAL Right 05/08/2019   Procedure: REMOVAL EXTERNAL FIXATION ANKLE, PLACEMENT OF CAST;  Surgeon: [name], MD;  Location: [facility] OR;  Service: Orthopedics;  Laterality: Right;   FOOT SURGERY Bilateral    hammer toe   HERNIA REPAIR Bilateral 2009   Inguinial   I & D EXTREMITY Right 03/11/2019   Procedure: IRRIGATION AND DEBRIDEMENT EXTREMITY;  Surgeon: [name], MD;  Location: [facility] OR;  Service: Orthopedics;  Laterality: Right;   IR FLUORO GUIDE CV LINE RIGHT  07/09/2019   OPEN REDUCTION INTERNAL FIXATION (ORIF) TIBIA/FIBULA FRACTURE Right 06/10/2019   Procedure: EXCISION RIGHT TIBIA AND FIBULA, WOUND DEBRIDEMENT, AND FIXATION OF TIBIA AND FIBULA;  Surgeon: Nadara Mustard, MD;  Location: [facility];  Service: Orthopedics;  Laterality: Right;   OPEN REDUCTION INTERNAL FIXATION (ORIF) TIBIA/FIBULA FRACTURE Right 06/12/2019   Procedure: DEBRIDEMENT AND REVISION INTERNAL FIXATION INFECTED RIGHT PILON FRACTURE;  Surgeon: Nadara Mustard, MD;  Location: [facility] OR;  Service: Orthopedics;  Laterality: Right;   ORIF ANKLE FRACTURE Right 03/11/2019   Procedure: OPEN REDUCTION INTERNAL FIXATION (ORIF) ANKLE FRACTURE;  Surgeon: [name], MD;  Location: [facility];  Service: Orthopedics;  Laterality: Right;   ORIF WRIST FRACTURE Right 03/11/2019   Procedure: OPEN REDUCTION INTERNAL FIXATION (ORIF) WRIST FRACTURE;  Surgeon: [name], MD;  Location: [facility];  Service: Orthopedics;  Laterality:  Right;   TONSILLECTOMY     TRANSTHORACIC ECHOCARDIOGRAM  01/2019   EF 55 to 60%.  Normal wall motion.  Indeterminate likely grade 1 diastolic function.  Mild degenerative mitral valve disease with thickening but no prolapse.   Social History   Occupational History   Occupation: Production designer, theatre/television/film at [employer]: [employer]  Tobacco Use   Smoking status: Never   Smokeless tobacco: Never  Vaping Use   Vaping status: Never Used  Substance and Sexual Activity   Alcohol use: Yes    Comment: social   Drug use: No   Sexual activity: Yes

## 2024-05-06 ENCOUNTER — Other Ambulatory Visit (INDEPENDENT_AMBULATORY_CARE_PROVIDER_SITE_OTHER)

## 2024-05-06 ENCOUNTER — Ambulatory Visit (INDEPENDENT_AMBULATORY_CARE_PROVIDER_SITE_OTHER): Admitting: Family

## 2024-05-06 ENCOUNTER — Encounter: Payer: Self-pay | Admitting: Family

## 2024-05-06 DIAGNOSIS — M25562 Pain in left knee: Secondary | ICD-10-CM

## 2024-05-06 MED ORDER — LIDOCAINE HCL 1 % IJ SOLN
5.0000 mL | INTRAMUSCULAR | Status: AC | PRN
  Administered 2024-05-06: 5 mL

## 2024-05-06 MED ORDER — METHYLPREDNISOLONE ACETATE 40 MG/ML IJ SUSP
40.0000 mg | INTRAMUSCULAR | Status: AC | PRN
  Administered 2024-05-06: 40 mg via INTRA_ARTICULAR

## 2024-05-06 NOTE — Progress Notes (Signed)
 Office Visit Note   Patient: Jonathan Bass           Date of Birth: [birth year]           MRN: [id number] Visit Date: 05/06/2024              Requested by: No referring provider defined for this encounter. PCP: Clydia Dart, MD (Inactive)  No chief complaint on file.     HPI: The patient is a 63 year old gentleman who presents today for a several month history of left knee pain this is primarily Lee anterior laterally there is associated swelling there is no erythema he has not had any associated injury.  He notes the pain is brought on by kneeling and direct palpation of the area.  He has no mechanical symptoms little to no pain with weightbearing in the knee  Assessment & Plan: Visit Diagnoses: No diagnosis found.  Plan: Depo-Medrol injection left knee patient tolerated well.  He will follow-up as needed  Follow-Up Instructions: No follow-ups on file.   Right Knee Exam   Tenderness  The patient is experiencing tenderness in the lateral retinaculum.  Range of Motion  The patient has normal right knee ROM.  Tests  Varus: negative Valgus: negative  Other  Erythema: absent Sensation: normal Swelling: mild Effusion: effusion present      Patient is alert, oriented, no adenopathy, well-dressed, normal affect, normal respiratory effort.   Imaging: No results found. No images are attached to the encounter.  Labs: Lab Results  Component Value Date   ESRSEDRATE 6 08/25/2019   CRP 7.5 08/25/2019   REPTSTATUS 06/17/2019 FINAL 06/12/2019   GRAMSTAIN  06/12/2019    RARE WBC PRESENT, PREDOMINANTLY PMN NO ORGANISMS SEEN    CULT  06/12/2019    RARE PSEUDOMONAS PUTIDA NO ANAEROBES ISOLATED CRITICAL RESULT CALLED TO, READ BACK BY AND VERIFIED WITH: RN Vincenza Greener  [id number] AT 1315 BY CM Performed at [facility], [address], [place], [place] [address]    LABORGA PSEUDOMONAS PUTIDA 06/12/2019     Lab Results  Component Value Date   ALBUMIN 3.3  (L) 06/14/2019   ALBUMIN 4.0 03/11/2019   ALBUMIN 4.0 01/19/2012    No results found for: "MG" No results found for: "VD25OH"  No results found for: "PREALBUMIN"    Latest Ref Rng & Units 05/28/2022    6:21 AM 12/04/2019    9:15 AM 06/10/2019    7:24 AM  CBC EXTENDED  WBC 4.0 - 10.5 K/uL 6.0  6.1    RBC 4.22 - 5.81 MIL/uL 4.84  5.29    Hemoglobin 13.0 - 17.0 g/dL 65.7  84.6  96.2   HCT 39.0 - 52.0 % 45.3  46.7    Platelets 150 - 400 K/uL 196  225    NEUT# 1.7 - 7.7 K/uL 2.7     Lymph# 0.7 - 4.0 K/uL 2.5        There is no height or weight on file to calculate BMI.  Orders:  No orders of the defined types were placed in this encounter.  No orders of the defined types were placed in this encounter.    Procedures: Large Joint Inj: L knee on 05/06/2024 4:14 PM Indications: pain Details: 18 G 1.5 in needle, superolateral approach Medications: 5 mL lidocaine  1 %; 40 mg methylPREDNISolone acetate 40 MG/ML Aspirate: 0 mL Outcome: tolerated well, no immediate complications Consent was given by the patient.      Clinical Data:  No additional findings.  ROS:  All other systems negative, except as noted in the HPI. Review of Systems  Objective: Vital Signs: There were no vitals taken for this visit.  Specialty Comments:  No specialty comments available.  PMFS History: Patient Active Problem List   Diagnosis Date Noted   Essential hypertension 03/26/2023   Gangrene of right foot (HCC) 12/04/2019   Subacute osteomyelitis, right ankle and foot (HCC)    Pilon fracture of right tibia, sequela 06/10/2019   Displaced pilon fracture of right tibia, initial encounter for open fracture type IIIA, IIIB, or IIIC    Subacute osteomyelitis of right tibia [facility])    Surgery, elective    Anxiety    Open pilon fracture, right, type III, initial encounter 03/11/2019   Closed fracture of right distal radius 03/11/2019   Type III open pilon fracture of right tibia 03/11/2019    Pounding heartbeat 01/21/2019   Precordial chest pain 03/05/2014   Past Medical History:  Diagnosis Date   Anxiety    Hx of right BKA (HCC) 2020   Hypertension    Pulmonary nodule 10/2014   Noted on CT scan -> stable and follow-up 2019.  No further evaluation necessary.    Family History  Problem Relation Age of Onset   Prostate cancer Father    Valvular heart disease Father        Had a heart valve replaced (unsure of details)   Breast cancer Mother    Parkinson's disease Mother    Healthy Sister    Healthy Brother    Parkinson's disease Maternal Grandmother    Diabetes Maternal Grandfather     Past Surgical History:  Procedure Laterality Date   AMPUTATION Right 12/04/2019   Procedure: RIGHT BELOW KNEE AMPUTATION;  Surgeon: Timothy Ford, MD;  Location: [facility] OR;  Service: Orthopedics;  Laterality: Right;   APPLICATION OF WOUND VAC Right 06/10/2019   Procedure: Application Of Wound Vac -Prevena;  Surgeon: Timothy Ford, MD;  Location: [facility];  Service: Orthopedics;  Laterality: Right;   CARDIAC EVENT MONITOR  01/2019   redominantly NSR with average rate 60 bpm.  One short 4 beat run of NSVT.  Minimal PACs and PVCs noted.  No sustained arrhythmia.   CLOSED REDUCTION WRIST FRACTURE Right 03/11/2019   Procedure: CLOSED REDUCTION WRIST;  Surgeon: Laneta Pintos, MD;  [facility];  Service: Orthopedics;  Laterality: Right;   COLONOSCOPY     CYST EXCISION Left 11/22/2022   Procedure: LEFT INDEX FINGER EXCISION MUCOID CYST AND DEBRIDEMENT DISTAL INTERPHALANGEAL JOINT;  Surgeon: Brunilda Capra, MD;  Location: [facility];  Service: Orthopedics;  Laterality: Left;  45 MIN   EXTERNAL FIXATION LEG Right 03/11/2019   Procedure: EXTERNAL FIXATION LEG;  Surgeon: Laneta Pintos, MD;  Location: [facility];  Service: Orthopedics;  Laterality: Right;   EXTERNAL FIXATION REMOVAL Right 05/08/2019   Procedure: REMOVAL EXTERNAL FIXATION ANKLE, PLACEMENT OF CAST;  Surgeon: Laneta Pintos, MD;   Location: [facility];  Service: Orthopedics;  Laterality: Right;   FOOT SURGERY Bilateral    hammer toe   HERNIA REPAIR Bilateral 2009   Inguinial   I & D EXTREMITY Right 03/11/2019   Procedure: IRRIGATION AND DEBRIDEMENT EXTREMITY;  Surgeon: Laneta Pintos, MD;  Location: [facility];  Service: Orthopedics;  Laterality: Right;   IR FLUORO GUIDE CV LINE RIGHT  07/09/2019   OPEN REDUCTION INTERNAL FIXATION (ORIF) TIBIA/FIBULA FRACTURE Right 06/10/2019   Procedure: EXCISION RIGHT TIBIA  AND FIBULA, WOUND DEBRIDEMENT, AND FIXATION OF TIBIA AND FIBULA;  Surgeon: Timothy Ford, MD;  Location: [facility];  Service: Orthopedics;  Laterality: Right;   OPEN REDUCTION INTERNAL FIXATION (ORIF) TIBIA/FIBULA FRACTURE Right 06/12/2019   Procedure: DEBRIDEMENT AND REVISION INTERNAL FIXATION INFECTED RIGHT PILON FRACTURE;  Surgeon: Timothy Ford, MD;  Location: [facility] OR;  Service: Orthopedics;  Laterality: Right;   ORIF ANKLE FRACTURE Right 03/11/2019   Procedure: OPEN REDUCTION INTERNAL FIXATION (ORIF) ANKLE FRACTURE;  Surgeon: Laneta Pintos, MD;  Location: [facility];  Service: Orthopedics;  Laterality: Right;   ORIF WRIST FRACTURE Right 03/11/2019   Procedure: OPEN REDUCTION INTERNAL FIXATION (ORIF) WRIST FRACTURE;  Surgeon: Laneta Pintos, MD;  Location: [facility];  Service: Orthopedics;  Laterality: Right;   TONSILLECTOMY     TRANSTHORACIC ECHOCARDIOGRAM  01/2019   EF 55 to 60%.  Normal wall motion.  Indeterminate likely grade 1 diastolic function.  Mild degenerative mitral valve disease with thickening but no prolapse.   Social History   Occupational History   Occupation: Production designer, theatre/television/film at [employer]: [employer]  Tobacco Use   Smoking status: Never   Smokeless tobacco: Never  Vaping Use   Vaping status: Never Used  Substance and Sexual Activity   Alcohol use: Yes    Comment: social   Drug use: No   Sexual activity: Yes

## 2024-09-14 ENCOUNTER — Encounter: Payer: Self-pay | Admitting: Urgent Care

## 2024-09-14 DIAGNOSIS — Z0000 Encounter for general adult medical examination without abnormal findings: Secondary | ICD-10-CM

## 2024-09-14 NOTE — Telephone Encounter (Signed)
 Please notify pt that I can place an order for standard labs to be done prior. If there are any specialty labs that he would need, we would have to discuss in the office. (Standard labs include TSH, Lipid, CMP, CBC, A1C, PSA) thanks

## 2024-09-15 NOTE — Telephone Encounter (Signed)
 LM for pt notifying him That he can come in to have standard labs drawn. As well as sent a MyChart message.

## 2024-09-19 LAB — CBC
Hematocrit: 48.2 % (ref 37.5–51.0)
Hemoglobin: 16.3 g/dL (ref 13.0–17.7)
MCH: 32 pg (ref 26.6–33.0)
MCHC: 33.8 g/dL (ref 31.5–35.7)
MCV: 95 fL (ref 79–97)
Platelets: 210 x10E3/uL (ref 150–450)
RBC: 5.1 x10E6/uL (ref 4.14–5.80)
RDW: 12.4 % (ref 11.6–15.4)
WBC: 5.8 x10E3/uL (ref 3.4–10.8)

## 2024-09-19 LAB — CMP14+EGFR
ALT: 43 IU/L (ref 0–44)
AST: 30 IU/L (ref 0–40)
Albumin: 4.4 g/dL (ref 3.9–4.9)
Alkaline Phosphatase: 83 IU/L (ref 47–123)
BUN/Creatinine Ratio: 13 (ref 10–24)
BUN: 15 mg/dL (ref 8–27)
Bilirubin Total: 0.2 mg/dL (ref 0.0–1.2)
CO2: 22 mmol/L (ref 20–29)
Calcium: 9.3 mg/dL (ref 8.6–10.2)
Chloride: 103 mmol/L (ref 96–106)
Creatinine, Ser: 1.14 mg/dL (ref 0.76–1.27)
Globulin, Total: 2 g/dL (ref 1.5–4.5)
Glucose: 106 mg/dL — ABNORMAL HIGH (ref 70–99)
Potassium: 4.6 mmol/L (ref 3.5–5.2)
Sodium: 141 mmol/L (ref 134–144)
Total Protein: 6.4 g/dL (ref 6.0–8.5)
eGFR: 71 mL/min/1.73 (ref >59)

## 2024-09-19 LAB — LIPID PANEL
Chol/HDL Ratio: 4.3 ratio (ref 0.0–5.0)
Cholesterol, Total: 189 mg/dL (ref 100–199)
HDL: 44 mg/dL (ref >39)
LDL Chol Calc (NIH): 122 mg/dL — ABNORMAL HIGH (ref 0–99)
Triglycerides: 128 mg/dL (ref 0–149)
VLDL Cholesterol Cal: 23 mg/dL (ref 5–40)

## 2024-09-19 LAB — HEMOGLOBIN A1C
Est. average glucose Bld gHb Est-mCnc: 108 mg/dL
Hgb A1c MFr Bld: 5.4 % (ref 4.8–5.6)

## 2024-09-19 LAB — PSA: Prostate Specific Ag, Serum: 1.6 ng/mL (ref 0.0–4.0)

## 2024-09-19 LAB — TSH: TSH: 2.45 u[IU]/mL (ref 0.450–4.500)

## 2024-09-20 ENCOUNTER — Ambulatory Visit: Payer: Self-pay | Admitting: Urgent Care

## 2024-09-25 ENCOUNTER — Encounter: Payer: Self-pay | Admitting: Urgent Care

## 2024-09-25 ENCOUNTER — Ambulatory Visit: Admitting: Urgent Care

## 2024-09-25 VITALS — BP 160/90 | HR 75 | Ht 71.0 in | Wt 204.0 lb

## 2024-09-25 DIAGNOSIS — F430 Acute stress reaction: Secondary | ICD-10-CM

## 2024-09-25 DIAGNOSIS — Z89511 Acquired absence of right leg below knee: Secondary | ICD-10-CM | POA: Diagnosis not present

## 2024-09-25 DIAGNOSIS — I10 Essential (primary) hypertension: Secondary | ICD-10-CM | POA: Diagnosis not present

## 2024-09-25 DIAGNOSIS — Z23 Encounter for immunization: Secondary | ICD-10-CM | POA: Diagnosis not present

## 2024-09-25 MED ORDER — BUSPIRONE HCL 10 MG PO TABS: ORAL_TABLET | ORAL | 3 refills | Status: DC

## 2024-09-25 NOTE — Progress Notes (Signed)
 New Patient Office Visit  Subjective:  Patient ID: Jonathan Bass, male    DOB: 09-01-1961  Age: 63 y.o. MRN: [id number]  CC:  Chief Complaint  Patient presents with   Establish Care    HPI [facility] presents to establish care.  Discussed the use of AI scribe software for clinical note transcription with the patient, who gave verbal consent to proceed.  History of Present Illness   Jonathan Bass is a 63 year old male who presents to establish care.  He is transitioning from [employer] and is seeking a new provider. He is interested in understanding his lab results, particularly his glucose and cholesterol levels. Recent labs showed an LDL that was minimally elevated and a glucose level of 106 mg/dL. His A1c was 5.4%.  He is currently taking losartan 25 mg daily for blood pressure management, which was prescribed after monitoring his blood pressure that was consistently around 130/82 mmHg. He also takes propranolol  as needed for anxiety, though he uses it very sporadically, primarily for public speaking or high-stress situations. He has a history of high stress due to his previous job at [employer], which involved international assignments and high responsibility roles. He plans to retire in April next year and is interested in potentially weaning off his anxiety medication, venlafaxine, after retirement. 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  He has a history of a right BKA following a compound fracture and subsequent infection in 2020. He underwent multiple surgeries, including the removal of two inches of bone and a prolonged course of antibiotics. He now uses a prosthetic leg and remains active, though he no longer runs due to concerns about stability of the prosthesis. He experiences significant sweating with physical activity, which affects the fit  of his prosthetic.  He has a history of shingles, which he developed in early 2021 following a period of high stress and multiple surgeries. He has not received the shingles vaccine yet.  He received a flu shot in September 2025 and is considering the COVID-19 vaccine, which he has tolerated well in the past. He is also inquiring about the pneumonia vaccine, as he has not received it yet.       Outpatient Encounter Medications as of 09/25/2024  Medication Sig   aspirin  EC 81 MG tablet Take 81 mg by mouth daily.   busPIRone (BUSPAR) 10 MG tablet Start with 1/2 tab once daily x 5 days, then increase to 1/2 tab twice daily x 5 days, then 1 tab in AM, 1/2 tab in PM x 5 days, then continue with 1 tab twice daily.   losartan (COZAAR) 25 MG tablet Take 25 mg by mouth daily.   Multiple Vitamin (MULTIVITAMIN WITH MINERALS) TABS tablet Take 1 tablet by mouth daily.   propranolol  (INDERAL ) 10 MG tablet Take 10 mg by mouth 2 (two) times daily as needed (anxiety).  (Patient taking differently: Take 10 mg by mouth as needed (anxiety).)   venlafaxine XR (EFFEXOR-XR) 150 MG 24 hr capsule Take 150 mg by mouth daily with breakfast.   Omega-3 Fatty Acids (FISH OIL PO) Take 1 capsule by mouth daily. (Patient not taking: Reported on 09/25/2024)   No facility-administered encounter medications on file as of 09/25/2024.    Past Medical History:  Diagnosis Date   Anxiety    Hx of right BKA (HCC) 2020  Hypertension    Pulmonary nodule 10/2014   Noted on CT scan -> stable and follow-up 2019.  No further evaluation necessary.    Past Surgical History:  Procedure Laterality Date   AMPUTATION Right 12/04/2019   Procedure: RIGHT BELOW KNEE AMPUTATION;  Surgeon: Harden Jerona GAILS, MD;  Location: [facility] OR;  Service: Orthopedics;  Laterality: Right;   APPLICATION OF WOUND VAC Right 06/10/2019   Procedure: Application Of Wound Vac -Prevena;  Surgeon: Harden Jerona GAILS, MD;  Location: [facility] OR;  Service: Orthopedics;   Laterality: Right;   CARDIAC EVENT MONITOR  01/2019   redominantly NSR with average rate 60 bpm.  One short 4 beat run of NSVT.  Minimal PACs and PVCs noted.  No sustained arrhythmia.   CLOSED REDUCTION WRIST FRACTURE Right 03/11/2019   Procedure: CLOSED REDUCTION WRIST;  Surgeon: Kendal Franky SQUIBB, MD;  Location: [facility] OR;  Service: Orthopedics;  Laterality: Right;   COLONOSCOPY     CYST EXCISION Left 11/22/2022   Procedure: LEFT INDEX FINGER EXCISION MUCOID CYST AND DEBRIDEMENT DISTAL INTERPHALANGEAL JOINT;  Surgeon: Murrell Franky, MD;  Location: [facility];  Service: Orthopedics;  Laterality: Left;  45 MIN   EXTERNAL FIXATION LEG Right 03/11/2019   Procedure: EXTERNAL FIXATION LEG;  Surgeon: Kendal Franky SQUIBB, MD;  Location: [facility] OR;  Service: Orthopedics;  Laterality: Right;   EXTERNAL FIXATION REMOVAL Right 05/08/2019   Procedure: REMOVAL EXTERNAL FIXATION ANKLE, PLACEMENT OF CAST;  Surgeon: Kendal Franky SQUIBB, MD;  Location: [facility] OR;  Service: Orthopedics;  Laterality: Right;   FOOT SURGERY Bilateral    hammer toe   HERNIA REPAIR Bilateral 2009   Inguinial   I & D EXTREMITY Right 03/11/2019   Procedure: IRRIGATION AND DEBRIDEMENT EXTREMITY;  Surgeon: Kendal Franky SQUIBB, MD;  Location: [facility] OR;  Service: Orthopedics;  Laterality: Right;   IR FLUORO GUIDE CV LINE RIGHT  07/09/2019   OPEN REDUCTION INTERNAL FIXATION (ORIF) TIBIA/FIBULA FRACTURE Right 06/10/2019   Procedure: EXCISION RIGHT TIBIA AND FIBULA, WOUND DEBRIDEMENT, AND FIXATION OF TIBIA AND FIBULA;  Surgeon: Harden Jerona GAILS, MD;  Location: [facility] OR;  Service: Orthopedics;  Laterality: Right;   OPEN REDUCTION INTERNAL FIXATION (ORIF) TIBIA/FIBULA FRACTURE Right 06/12/2019   Procedure: DEBRIDEMENT AND REVISION INTERNAL FIXATION INFECTED RIGHT PILON FRACTURE;  Surgeon: Harden Jerona GAILS, MD;  Location: [facility] OR;  Service: Orthopedics;  Laterality: Right;   ORIF ANKLE FRACTURE Right 03/11/2019   Procedure: OPEN REDUCTION INTERNAL FIXATION (ORIF) ANKLE  FRACTURE;  Surgeon: Kendal Franky SQUIBB, MD;  Location: [facility] OR;  Service: Orthopedics;  Laterality: Right;   ORIF WRIST FRACTURE Right 03/11/2019   Procedure: OPEN REDUCTION INTERNAL FIXATION (ORIF) WRIST FRACTURE;  Surgeon: Kendal Franky SQUIBB, MD;  Location: [facility] OR;  Service: Orthopedics;  Laterality: Right;   TONSILLECTOMY     TRANSTHORACIC ECHOCARDIOGRAM  01/2019   EF 55 to 60%.  Normal wall motion.  Indeterminate likely grade 1 diastolic function.  Mild degenerative mitral valve disease with thickening but no prolapse.    Family History  Problem Relation Age of Onset   Prostate cancer Father    Valvular heart disease Father        Had a heart valve replaced (unsure of details)   Breast cancer Mother    Parkinson's disease Mother    Healthy Sister    Healthy Brother    Parkinson's disease Maternal Grandmother    Diabetes Maternal Grandfather     Social History   Socioeconomic History   Marital status: Married  Spouse name: Not on file   Number of children: 3   Years of education: 16   Highest education level: Bachelor's degree (e.g., BA, AB, BS)  Occupational History   Occupation: Production designer, theatre/television/film at [employer]: [employer]  Tobacco Use   Smoking status: Never   Smokeless tobacco: Never  Vaping Use   Vaping status: Never Used  Substance and Sexual Activity   Alcohol use: Yes    Comment: social   Drug use: No   Sexual activity: Yes  Other Topics Concern   Not on file  Social History Narrative   He has been married for 37 years.  He lives with his wife.  They have 3 children and 3 grandchildren.      He says he exercises just about every day.  He does alternating upper and lower body weights as well as about 30 to 40 minutes of cardio exercise either doing stationary bicycle, elliptical or walking on treadmill.      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  He thinks this may be where he bothered his right arm.   Social Drivers of Corporate investment banker Strain: Not on file  Food Insecurity: Low Risk  (07/24/2024)   Received from [facility]   Hunger Vital Sign    Within the past 12 months, you worried that your food would run out before you got money to buy more: Never true    Within the past 12 months, the food you bought just didn't last and you didn't have money to get more. : Never true  Transportation Needs: No Transportation Needs (07/24/2024)   Received from [facility]    In the past 12 months, has lack of reliable transportation kept you from medical appointments, meetings, work or from getting things needed for daily living? : No  Physical Activity: Not on file  Stress: Not on file  Social Connections: Unknown (04/30/2022)   Received from [facility]   Social Network    Social Network: Not on file  Intimate Partner Violence: Not At Risk (06/22/2024)   Received from [facility]   HITS    Over the last 12 months how often did your partner physically hurt you?: Never    Over the last 12 months how often did your partner insult you or talk down to you?: Never    Over the last 12 months how often did your partner threaten you with physical harm?: Never    Over the last 12 months how often did your partner scream or curse at you?: Never    ROS: as noted in HPI  Objective:  BP (!) 160/90   Pulse 75   Ht 5' 11 (1.803 m)   Wt 204 lb (92.5 kg)   SpO2 97%   BMI 28.45 kg/m   Physical Exam Vitals and nursing note reviewed. Exam conducted with a chaperone present.  Constitutional:      General: He is not in acute distress.    Appearance: Normal appearance. He is not ill-appearing, toxic-appearing or diaphoretic.  HENT:     Head: Normocephalic and atraumatic.     Right Ear: External ear normal.     Left Ear: External ear normal.     Nose: Nose normal.     Mouth/Throat:  Mouth: Mucous membranes are moist.     Pharynx: Oropharynx is clear. No oropharyngeal exudate or posterior oropharyngeal erythema.  Eyes:     General: No scleral icterus.       Right eye: No discharge.        Left eye: No discharge.     Extraocular Movements: Extraocular movements intact.     Pupils: Pupils are equal, round, and reactive to light.  Neck:     Thyroid : No thyroid  mass, thyromegaly or thyroid  tenderness.  Cardiovascular:     Rate and Rhythm: Normal rate and regular rhythm.     Pulses: Normal pulses.     Heart sounds: No murmur heard. Pulmonary:     Effort: Pulmonary effort is normal. No respiratory distress.     Breath sounds: Normal breath sounds. No stridor. No wheezing or rhonchi.  Musculoskeletal:     Cervical back: Normal range of motion and neck supple. No rigidity or tenderness.     Left lower leg: No edema.     Comments: R BKA with prosthesis noted  Lymphadenopathy:     Cervical: No cervical adenopathy.  Skin:    General: Skin is warm and dry.     Coloration: Skin is not jaundiced.     Findings: No bruising, erythema or rash.  Neurological:     General: No focal deficit present.     Mental Status: He is alert and oriented to person, place, and time.     Sensory: No sensory deficit.     Motor: No weakness.  Psychiatric:        Mood and Affect: Mood normal.        Behavior: Behavior normal.     Last CBC Lab Results  Component Value Date   WBC 5.8 09/18/2024   HGB 16.3 09/18/2024   HCT 48.2 09/18/2024   MCV 95 09/18/2024   MCH 32.0 09/18/2024   RDW 12.4 09/18/2024   PLT 210 09/18/2024   Last metabolic panel Lab Results  Component Value Date   GLUCOSE 106 (H) 09/18/2024   NA 141 09/18/2024   K 4.6 09/18/2024   CL 103 09/18/2024   CO2 22 09/18/2024   BUN 15 09/18/2024   CREATININE 1.14 09/18/2024   EGFR 71 09/18/2024   CALCIUM 9.3 09/18/2024   PROT 6.4 09/18/2024   ALBUMIN 4.4 09/18/2024   LABGLOB 2.0 09/18/2024   BILITOT 0.2 09/18/2024    ALKPHOS 83 09/18/2024   AST 30 09/18/2024   ALT 43 09/18/2024   ANIONGAP 9 05/28/2022   Last lipids Lab Results  Component Value Date   CHOL 189 09/18/2024   HDL 44 09/18/2024   LDLCALC 122 (H) 09/18/2024   TRIG 128 09/18/2024   CHOLHDL 4.3 09/18/2024   Last hemoglobin A1c Lab Results  Component Value Date   HGBA1C 5.4 09/18/2024   Last thyroid  functions Lab Results  Component Value Date   TSH 2.450 09/18/2024   Last vitamin D  No results found for: 25OHVITD2, 25OHVITD3, VD25OH Last vitamin B12 and Folate No results found for: VITAMINB12, FOLATE    Assessment & Plan:  Essential hypertension  Status post below-knee amputation of right lower extremity (HCC)  Stress reaction -     busPIRone HCl; Start with 1/2 tab once daily x 5 days, then increase to 1/2 tab twice daily x 5 days, then 1 tab in AM, 1/2 tab in PM x 5 days, then continue with 1 tab twice daily.  Dispense: 60 tablet; Refill: 3  Immunization  due -     Pneumococcal conjugate vaccine 20-valent Best boy Vaccine 40yrs & older  Assessment and Plan    Hypertension Blood pressure elevated at 160/90, anxiety contributing. On losartan 25 mg daily. - Monitor blood pressure at home daily for two weeks. - Increase losartan to 50 mg if blood pressure remains elevated. - Start buspirone with tapering schedule: 5 mg once daily for 5 days, then 5 mg twice daily, then 10 mg once daily, then 10 mg and 5 mg, and finally 10 mg twice daily. - Follow up with blood pressure readings in three weeks via MyChart.  Anxiety Chronic anxiety related to high-stress job, impacting blood pressure. On venlafaxine, considered buspirone. - Start buspirone with tapering schedule as outlined.  Right lower extremity amputation Amputation due to complications from compound fracture and infection. Using prosthetic limb, maintaining active lifestyle. - Consider discussing running prosthetic options with  specialist if interested.  General Health Maintenance Discussed vaccinations: flu, COVID-19, pneumococcal. Received flu vaccine in September. Discussed shingles vaccination post-infection. - Administer Prevnar 20 for pneumococcal vaccination. - Administer COVID-19 vaccine if desired. - Consider shingles vaccine if one year has passed since last shingles episode.        Return in about 4 weeks (around 10/23/2024) for Annual Physical.   Benton LITTIE Gave, PA

## 2024-09-25 NOTE — Patient Instructions (Signed)
 Please monitor your blood pressure at home. Goal readings are 120/80.  Please add Buspirone to help with anxiety. Start with 1/2 tab once daily x 5 days, then increase to 1/2 tab twice daily x 5 days, then 1 tab in AM, 1/2 tab in PM x 5 days, then continue with 1 tab twice daily.   Please bring your home blood pressure monitor in for accuracy check at your next office visit.  Please return in one month for annual physical. No labs needed as they were already completed.

## 2024-10-02 ENCOUNTER — Ambulatory Visit: Admitting: Urgent Care

## 2024-10-19 ENCOUNTER — Encounter: Payer: Self-pay | Admitting: Radiology

## 2024-10-23 ENCOUNTER — Other Ambulatory Visit: Payer: Self-pay | Admitting: Medical Genetics

## 2024-10-30 ENCOUNTER — Ambulatory Visit (INDEPENDENT_AMBULATORY_CARE_PROVIDER_SITE_OTHER): Admitting: Urgent Care

## 2024-10-30 ENCOUNTER — Encounter: Payer: Self-pay | Admitting: Urgent Care

## 2024-10-30 VITALS — BP 145/87 | HR 60 | Resp 20 | Ht 71.0 in | Wt 204.0 lb

## 2024-10-30 DIAGNOSIS — F419 Anxiety disorder, unspecified: Secondary | ICD-10-CM

## 2024-10-30 DIAGNOSIS — G8929 Other chronic pain: Secondary | ICD-10-CM

## 2024-10-30 DIAGNOSIS — R350 Frequency of micturition: Secondary | ICD-10-CM

## 2024-10-30 DIAGNOSIS — M25552 Pain in left hip: Secondary | ICD-10-CM | POA: Diagnosis not present

## 2024-10-30 DIAGNOSIS — N401 Enlarged prostate with lower urinary tract symptoms: Secondary | ICD-10-CM

## 2024-10-30 DIAGNOSIS — Z0000 Encounter for general adult medical examination without abnormal findings: Secondary | ICD-10-CM | POA: Diagnosis not present

## 2024-10-30 DIAGNOSIS — I10 Essential (primary) hypertension: Secondary | ICD-10-CM

## 2024-10-30 DIAGNOSIS — M25562 Pain in left knee: Secondary | ICD-10-CM

## 2024-10-30 DIAGNOSIS — M545 Low back pain, unspecified: Secondary | ICD-10-CM

## 2024-10-30 MED ORDER — BUSPIRONE HCL 15 MG PO TABS: 15.0000 mg | ORAL_TABLET | Freq: Two times a day (BID) | ORAL | 5 refills | Status: DC

## 2024-10-30 NOTE — Patient Instructions (Signed)
 We completed your annual physical today. Your prostate feels minimally enlarged. We will monitor for now. PSA levels are normal.  Increase your buspirone to 15mg  twice daily.  Go to [address] to get an xray of your hip and knee. Please follow up with me in 3 months sooner if needed

## 2024-10-30 NOTE — Progress Notes (Signed)
 Annual Wellness Visit     Patient: Jonathan Bass, Male    DOB: [birth year], 63 y.o.   MRN: [id number]  Subjective  Chief Complaint  Patient presents with   Annual Exam   Anxiety    [name] is a 63 y.o. male who presents today for his Annual Wellness Visit. He reports consuming a general diet. Home exercise routine includes bike in his basement. He generally feels well. He reports sleeping well. He does have additional problems to discuss today.   HPI  Started buspirone last visit, doing okay overall, but still having anxiety regarding certain situations such as work-related tasks or visitors at his home  Normal PSA levels, does experience some frequency and a bit of hesitancy. Very mild at this point.  Wanting to assess L knee and hip pain. Feels that since the amputation his gait has been off, causing strain on the dominant side. Would like workup to preserve his function.  BP slightly elevated in office today - BP at home as been in the 120-130 range systolic, particularly when on vacation this past week. Feels that stress is his primary contributing factor.  Pt does follow with dermatology annually. No new skin concerns  Vision:Within last year and Dental: No current dental problems and Receives regular dental care   Patient Active Problem List   Diagnosis Date Noted   Status post below-knee amputation of right lower extremity (HCC) 09/25/2024   Essential hypertension 03/26/2023   Gangrene of right foot (HCC) 12/04/2019   Subacute osteomyelitis, right ankle and foot (HCC)    Pilon fracture of right tibia, sequela 06/10/2019   Displaced pilon fracture of right tibia, initial encounter for open fracture type IIIA, IIIB, or IIIC    Subacute osteomyelitis of right tibia [facility])    Surgery, elective    Anxiety    Open pilon fracture, right, type III, initial encounter 03/11/2019   Closed fracture of right distal radius 03/11/2019   Type III open pilon fracture of  right tibia 03/11/2019   Pounding heartbeat 01/21/2019   Precordial chest pain 03/05/2014   Past Medical History:  Diagnosis Date   Anxiety    Hx of right BKA (HCC) 2020   Hypertension    Pulmonary nodule 10/2014   Noted on CT scan -> stable and follow-up 2019.  No further evaluation necessary.   Past Surgical History:  Procedure Laterality Date   AMPUTATION Right 12/04/2019   Procedure: RIGHT BELOW KNEE AMPUTATION;  Surgeon: Harden Jerona GAILS, MD;  Location: [facility] OR;  Service: Orthopedics;  Laterality: Right;   APPLICATION OF WOUND VAC Right 06/10/2019   Procedure: Application Of Wound Vac -Prevena;  Surgeon: Harden Jerona GAILS, MD;  Location: [facility] OR;  Service: Orthopedics;  Laterality: Right;   CARDIAC EVENT MONITOR  01/2019   redominantly NSR with average rate 60 bpm.  One short 4 beat run of NSVT.  Minimal PACs and PVCs noted.  No sustained arrhythmia.   CLOSED REDUCTION WRIST FRACTURE Right 03/11/2019   Procedure: CLOSED REDUCTION WRIST;  Surgeon: Kendal Franky SQUIBB, MD;  Location: [facility] OR;  Service: Orthopedics;  Laterality: Right;   COLONOSCOPY     CYST EXCISION Left 11/22/2022   Procedure: LEFT INDEX FINGER EXCISION MUCOID CYST AND DEBRIDEMENT DISTAL INTERPHALANGEAL JOINT;  Surgeon: Murrell Franky, MD;  Location: [facility];  Service: Orthopedics;  Laterality: Left;  45 MIN   EXTERNAL FIXATION LEG Right 03/11/2019   Procedure: EXTERNAL FIXATION LEG;  Surgeon: Kendal Franky  P, MD;  Location: [facility] OR;  Service: Orthopedics;  Laterality: Right;   EXTERNAL FIXATION REMOVAL Right 05/08/2019   Procedure: REMOVAL EXTERNAL FIXATION ANKLE, PLACEMENT OF CAST;  Surgeon: Kendal Franky SQUIBB, MD;  Location: [facility] OR;  Service: Orthopedics;  Laterality: Right;   FOOT SURGERY Bilateral    hammer toe   HERNIA REPAIR Bilateral 2009   Inguinial   I & D EXTREMITY Right 03/11/2019   Procedure: IRRIGATION AND DEBRIDEMENT EXTREMITY;  Surgeon: Kendal Franky SQUIBB, MD;  Location: [facility] OR;  Service: Orthopedics;   Laterality: Right;   IR FLUORO GUIDE CV LINE RIGHT  07/09/2019   OPEN REDUCTION INTERNAL FIXATION (ORIF) TIBIA/FIBULA FRACTURE Right 06/10/2019   Procedure: EXCISION RIGHT TIBIA AND FIBULA, WOUND DEBRIDEMENT, AND FIXATION OF TIBIA AND FIBULA;  Surgeon: Harden Jerona GAILS, MD;  Location: [facility] OR;  Service: Orthopedics;  Laterality: Right;   OPEN REDUCTION INTERNAL FIXATION (ORIF) TIBIA/FIBULA FRACTURE Right 06/12/2019   Procedure: DEBRIDEMENT AND REVISION INTERNAL FIXATION INFECTED RIGHT PILON FRACTURE;  Surgeon: Harden Jerona GAILS, MD;  Location: [facility] OR;  Service: Orthopedics;  Laterality: Right;   ORIF ANKLE FRACTURE Right 03/11/2019   Procedure: OPEN REDUCTION INTERNAL FIXATION (ORIF) ANKLE FRACTURE;  Surgeon: Kendal Franky SQUIBB, MD;  Location: [facility] OR;  Service: Orthopedics;  Laterality: Right;   ORIF WRIST FRACTURE Right 03/11/2019   Procedure: OPEN REDUCTION INTERNAL FIXATION (ORIF) WRIST FRACTURE;  Surgeon: Kendal Franky SQUIBB, MD;  Location: [facility] OR;  Service: Orthopedics;  Laterality: Right;   TONSILLECTOMY     TRANSTHORACIC ECHOCARDIOGRAM  01/2019   EF 55 to 60%.  Normal wall motion.  Indeterminate likely grade 1 diastolic function.  Mild degenerative mitral valve disease with thickening but no prolapse.   Social History   Tobacco Use   Smoking status: Never   Smokeless tobacco: Never  Vaping Use   Vaping status: Never Used  Substance Use Topics   Alcohol use: Yes    Comment: social   Drug use: No      Medications: Outpatient Medications Prior to Visit  Medication Sig   aspirin  EC 81 MG tablet Take 81 mg by mouth daily.   losartan (COZAAR) 25 MG tablet Take 25 mg by mouth daily.   Multiple Vitamin (MULTIVITAMIN WITH MINERALS) TABS tablet Take 1 tablet by mouth daily.   propranolol  (INDERAL ) 10 MG tablet Take 10 mg by mouth 2 (two) times daily as needed (anxiety).  (Patient taking differently: Take 10 mg by mouth as needed (anxiety).)   venlafaxine XR (EFFEXOR-XR) 150 MG 24 hr capsule Take 150 mg by  mouth daily with breakfast.   [DISCONTINUED] busPIRone (BUSPAR) 10 MG tablet Start with 1/2 tab once daily x 5 days, then increase to 1/2 tab twice daily x 5 days, then 1 tab in AM, 1/2 tab in PM x 5 days, then continue with 1 tab twice daily.   [DISCONTINUED] Omega-3 Fatty Acids (FISH OIL PO) Take 1 capsule by mouth daily. (Patient not taking: Reported on 09/25/2024)   No facility-administered medications prior to visit.    No Known Allergies  Patient Care Team: Lowella Benton CROME, [place] as PCP - General (Physician Assistant) Anner Alm ORN, MD as PCP - Cardiology (Cardiology)  ROS Complete 12 point ROS performed with all pertinent positives listed in HPI     Objective  BP (!) 145/87 (BP Location: Left Arm, Cuff Size: Normal)   Pulse 60   Resp 20   Ht 5' 11 (1.803 m)   Wt 204 lb (92.5 kg)  SpO2 98%   BMI 28.45 kg/m  BP Readings from Last 3 Encounters:  10/30/24 (!) 145/87  09/25/24 (!) 160/90  03/08/23 124/82   Wt Readings from Last 3 Encounters:  10/30/24 204 lb (92.5 kg)  09/25/24 204 lb (92.5 kg)  03/08/23 203 lb (92.1 kg)      Physical Exam Vitals and nursing note reviewed. Exam conducted with a chaperone present.  Constitutional:      General: He is not in acute distress.    Appearance: Normal appearance. He is not ill-appearing, toxic-appearing or diaphoretic.  HENT:     Head: Normocephalic and atraumatic.     Right Ear: External ear normal.     Left Ear: External ear normal.     Nose: Nose normal.     Mouth/Throat:     Mouth: Mucous membranes are moist.     Pharynx: Oropharynx is clear. No oropharyngeal exudate or posterior oropharyngeal erythema.  Eyes:     General: No scleral icterus.       Right eye: No discharge.        Left eye: No discharge.     Extraocular Movements: Extraocular movements intact.     Pupils: Pupils are equal, round, and reactive to light.  Neck:     Thyroid : No thyroid  mass, thyromegaly or thyroid  tenderness.  Cardiovascular:      Rate and Rhythm: Normal rate and regular rhythm.     Pulses: Normal pulses.     Heart sounds: No murmur heard. Pulmonary:     Effort: Pulmonary effort is normal. No respiratory distress.     Breath sounds: Normal breath sounds. No stridor. No wheezing or rhonchi.  Genitourinary:    Prostate: Enlarged. Not tender and no nodules present.     Comments: Minimally enlarged prostate without nodule or tenderness Musculoskeletal:     Cervical back: Normal range of motion and neck supple. No rigidity or tenderness.     Left lower leg: No edema.     Comments: R BKA with prosthesis noted  Lymphadenopathy:     Cervical: No cervical adenopathy.  Skin:    General: Skin is warm and dry.     Coloration: Skin is not jaundiced.     Findings: Lesion (numerous acrochordons noted around neck) present. No bruising, erythema or rash.  Neurological:     General: No focal deficit present.     Mental Status: He is alert and oriented to person, place, and time.     Sensory: No sensory deficit.     Motor: No weakness.  Psychiatric:        Mood and Affect: Mood normal.        Behavior: Behavior normal.     Most recent depression screenings:    10/30/2024    2:15 PM 07/13/2019    2:31 PM  PHQ 2/9 Scores  PHQ - 2 Score 0 0  PHQ- 9 Score 1     Fall Screening Falls in the past year?: 0 Number of falls in past year: 0 Was there an injury with Fall?: 0 Fall Risk Category Calculator: 0 Patient Fall Risk Level: Low Fall Risk  Fall Risk Patient at Risk for Falls Due to: No Fall Risks Fall risk Follow up: Falls evaluation completed    Vision/Hearing Screen: No results found.  Last CBC Lab Results  Component Value Date   WBC 5.8 09/18/2024   HGB 16.3 09/18/2024   HCT 48.2 09/18/2024   MCV 95 09/18/2024   MCH 32.0 09/18/2024  RDW 12.4 09/18/2024   PLT 210 09/18/2024   Last metabolic panel Lab Results  Component Value Date   GLUCOSE 106 (H) 09/18/2024   NA 141 09/18/2024   K 4.6  09/18/2024   CL 103 09/18/2024   CO2 22 09/18/2024   BUN 15 09/18/2024   CREATININE 1.14 09/18/2024   EGFR 71 09/18/2024   CALCIUM 9.3 09/18/2024   PROT 6.4 09/18/2024   ALBUMIN 4.4 09/18/2024   LABGLOB 2.0 09/18/2024   BILITOT 0.2 09/18/2024   ALKPHOS 83 09/18/2024   AST 30 09/18/2024   ALT 43 09/18/2024   ANIONGAP 9 05/28/2022   Last lipids Lab Results  Component Value Date   CHOL 189 09/18/2024   HDL 44 09/18/2024   LDLCALC 122 (H) 09/18/2024   TRIG 128 09/18/2024   CHOLHDL 4.3 09/18/2024   Last hemoglobin A1c Lab Results  Component Value Date   HGBA1C 5.4 09/18/2024   Last thyroid  functions Lab Results  Component Value Date   TSH 2.450 09/18/2024   Last vitamin D  No results found for: 25OHVITD2, 25OHVITD3, VD25OH Last vitamin B12 and Folate No results found for: VITAMINB12, FOLATE    No results found for any visits on 10/30/24.    Assessment & Plan   Annual wellness visit done today including the all of the following: Reviewed patient's Family and Medical History Reviewed and updated list of patient's medical providers Assessment of cognitive impairment was done Assessed patient's functional ability Established a written schedule for health screening services Health Risk Assessent Completed and Reviewed     Immunization History  Administered Date(s) Administered   Fluzone Influenza virus vaccine,trivalent (IIV3), split virus 10/19/2013, 10/18/2014   Influenza Inj Mdck Quad Pf 09/30/2018   Influenza Inj Mdck Quad With Preservative 10/02/2019   Influenza Split 09/16/2013   Influenza,inj,Quad PF,6+ Mos 11/09/2015, 09/13/2020   Influenza-Unspecified 08/25/2024   Moderna Covid-19 Vaccine Bivalent Booster 68yrs & up 11/01/2021   Moderna Sars-Covid-2 Vaccination 03/02/2020, 04/02/2020, 10/28/2020, 05/24/2021   PNEUMOCOCCAL CONJUGATE-20 09/25/2024   Pfizer(Comirnaty)Fall Seasonal Vaccine 12 years and older 09/25/2024   Tdap 10/18/2014,  03/11/2019    Health Maintenance  Topic Date Due   Hepatitis C Screening  Never done   Zoster Vaccines- Shingrix (1 of 2) Never done   COVID-19 Vaccine (7 - Moderna risk 2025-26 season) 03/26/2025   DTaP/Tdap/Td (3 - Td or Tdap) 03/10/2029   Colonoscopy  03/17/2031   Pneumococcal Vaccine: 50+ Years  Completed   Influenza Vaccine  Completed   HIV Screening  Completed   Hepatitis B Vaccines 19-59 Average Risk  Aged Out   Meningococcal B Vaccine  Aged Out     Discussed health benefits of physical activity, and encouraged him to engage in regular exercise appropriate for his age and condition.    Problem List Items Addressed This Visit     Anxiety (Chronic)   Relevant Medications   busPIRone (BUSPAR) 15 MG tablet   Essential hypertension (Chronic)   Other Visit Diagnoses       Routine adult health maintenance    -  Primary     Benign prostatic hyperplasia with urinary frequency         Chronic pain of left knee       Relevant Orders   DG Knee Complete 4 Views Left   Ambulatory referral to Physical Therapy     Left hip pain       Relevant Orders   DG HIP UNILAT W OR W/O PELVIS 2-3 VIEWS LEFT  Ambulatory referral to Physical Therapy     Chronic bilateral low back pain without sciatica       Relevant Orders   Ambulatory referral to Physical Therapy      Annual physical completed today - labs performed previously. Mild BPH with minimal symptoms - consider Rx therapy if sx worsen. PSA levels WNL Anxiety managed with effexor and buspar, suboptimal. Much of which is related to work, pt plans to retire next year. Will increase buspar to 15mg  bid Monitor BP - well controlled when stress is managed.  Due to amputation and gait changes, pt having L knee and L hip pain, likely positional. Will send downstairs for xrays and refer to PT  Return in about 3 months (around 01/30/2025).     Benton LITTIE Gave, PA

## 2024-10-31 ENCOUNTER — Encounter: Payer: Self-pay | Admitting: Urgent Care

## 2024-11-20 ENCOUNTER — Ambulatory Visit

## 2024-11-20 DIAGNOSIS — M25552 Pain in left hip: Secondary | ICD-10-CM

## 2024-11-20 DIAGNOSIS — G8929 Other chronic pain: Secondary | ICD-10-CM

## 2024-11-24 ENCOUNTER — Encounter: Payer: Self-pay | Admitting: Urgent Care

## 2024-11-27 ENCOUNTER — Ambulatory Visit: Payer: Self-pay | Admitting: Urgent Care

## 2024-11-30 ENCOUNTER — Other Ambulatory Visit: Payer: Self-pay

## 2024-11-30 DIAGNOSIS — F419 Anxiety disorder, unspecified: Secondary | ICD-10-CM

## 2024-11-30 MED ORDER — BUSPIRONE HCL 15 MG PO TABS: 15.0000 mg | ORAL_TABLET | Freq: Two times a day (BID) | ORAL | 1 refills | Status: AC

## 2024-12-01 ENCOUNTER — Encounter: Payer: Self-pay | Admitting: Urgent Care

## 2024-12-02 MED ORDER — LOSARTAN POTASSIUM 25 MG PO TABS: 25.0000 mg | ORAL_TABLET | Freq: Every day | ORAL | 3 refills | Status: AC

## 2024-12-31 ENCOUNTER — Encounter: Payer: Self-pay | Admitting: Urgent Care

## 2025-01-09 ENCOUNTER — Encounter: Payer: Self-pay | Admitting: Urgent Care

## 2025-01-11 MED ORDER — VENLAFAXINE HCL ER 150 MG PO CP24: 150.0000 mg | ORAL_CAPSULE | Freq: Every day | ORAL | 1 refills | Status: AC

## 2025-02-05 ENCOUNTER — Ambulatory Visit: Admitting: Urgent Care
